# Patient Record
Sex: Female | Born: 1945 | Race: White | Hispanic: No | Marital: Married | Smoking: Former smoker
Health system: Southern US, Community
[De-identification: ages and names within clinical notes are randomized; demographics above are authoritative.]

## PROBLEM LIST (undated history)

## (undated) ENCOUNTER — Inpatient Hospital Stay: Admission: EM | Payer: Self-pay | Source: Home / Self Care

## (undated) DIAGNOSIS — I2510 Atherosclerotic heart disease of native coronary artery without angina pectoris: Secondary | ICD-10-CM

## (undated) DIAGNOSIS — E669 Obesity, unspecified: Secondary | ICD-10-CM

## (undated) DIAGNOSIS — G4730 Sleep apnea, unspecified: Secondary | ICD-10-CM

## (undated) DIAGNOSIS — I10 Essential (primary) hypertension: Secondary | ICD-10-CM

## (undated) DIAGNOSIS — R112 Nausea with vomiting, unspecified: Secondary | ICD-10-CM

## (undated) DIAGNOSIS — Z85828 Personal history of other malignant neoplasm of skin: Secondary | ICD-10-CM

## (undated) DIAGNOSIS — K219 Gastro-esophageal reflux disease without esophagitis: Secondary | ICD-10-CM

## (undated) DIAGNOSIS — E119 Type 2 diabetes mellitus without complications: Secondary | ICD-10-CM

## (undated) DIAGNOSIS — C801 Malignant (primary) neoplasm, unspecified: Secondary | ICD-10-CM

## (undated) DIAGNOSIS — R252 Cramp and spasm: Secondary | ICD-10-CM

## (undated) DIAGNOSIS — S86012A Strain of left Achilles tendon, initial encounter: Secondary | ICD-10-CM

## (undated) DIAGNOSIS — R42 Dizziness and giddiness: Secondary | ICD-10-CM

## (undated) DIAGNOSIS — Z9889 Other specified postprocedural states: Secondary | ICD-10-CM

## (undated) DIAGNOSIS — M1710 Unilateral primary osteoarthritis, unspecified knee: Secondary | ICD-10-CM

## (undated) DIAGNOSIS — R0600 Dyspnea, unspecified: Secondary | ICD-10-CM

## (undated) DIAGNOSIS — E785 Hyperlipidemia, unspecified: Secondary | ICD-10-CM

## (undated) DIAGNOSIS — I499 Cardiac arrhythmia, unspecified: Secondary | ICD-10-CM

## (undated) HISTORY — DX: Hyperlipidemia, unspecified: E78.5

## (undated) HISTORY — PX: SKIN CANCER EXCISION: SHX779

## (undated) HISTORY — PX: APPENDECTOMY: SHX54

## (undated) HISTORY — DX: Obesity, unspecified: E66.9

## (undated) HISTORY — PX: COLONOSCOPY: SHX174

---

## 1999-09-16 ENCOUNTER — Encounter: Admission: RE | Admit: 1999-09-16 | Discharge: 1999-09-16 | Payer: Self-pay | Admitting: Internal Medicine

## 1999-09-16 ENCOUNTER — Encounter: Payer: Self-pay | Admitting: Internal Medicine

## 1999-11-05 ENCOUNTER — Other Ambulatory Visit: Admission: RE | Admit: 1999-11-05 | Discharge: 1999-11-05 | Payer: Self-pay | Admitting: Internal Medicine

## 2000-12-03 ENCOUNTER — Other Ambulatory Visit: Admission: RE | Admit: 2000-12-03 | Discharge: 2000-12-03 | Payer: Self-pay | Admitting: Internal Medicine

## 2001-10-26 ENCOUNTER — Encounter: Payer: Self-pay | Admitting: Internal Medicine

## 2001-10-26 ENCOUNTER — Encounter: Admission: RE | Admit: 2001-10-26 | Discharge: 2001-10-26 | Payer: Self-pay | Admitting: Internal Medicine

## 2002-05-03 ENCOUNTER — Ambulatory Visit (HOSPITAL_COMMUNITY): Admission: RE | Admit: 2002-05-03 | Discharge: 2002-05-03 | Payer: Self-pay | Admitting: Gastroenterology

## 2002-08-30 ENCOUNTER — Encounter: Admission: RE | Admit: 2002-08-30 | Discharge: 2002-08-30 | Payer: Self-pay | Admitting: Plastic Surgery

## 2002-08-30 ENCOUNTER — Encounter: Payer: Self-pay | Admitting: Plastic Surgery

## 2003-01-10 ENCOUNTER — Encounter: Payer: Self-pay | Admitting: Internal Medicine

## 2003-01-10 ENCOUNTER — Encounter: Admission: RE | Admit: 2003-01-10 | Discharge: 2003-01-10 | Payer: Self-pay | Admitting: Internal Medicine

## 2003-01-23 ENCOUNTER — Inpatient Hospital Stay (HOSPITAL_COMMUNITY): Admission: EM | Admit: 2003-01-23 | Discharge: 2003-01-24 | Payer: Self-pay | Admitting: Emergency Medicine

## 2003-01-23 ENCOUNTER — Encounter: Payer: Self-pay | Admitting: Emergency Medicine

## 2003-05-09 ENCOUNTER — Ambulatory Visit (HOSPITAL_COMMUNITY): Admission: RE | Admit: 2003-05-09 | Discharge: 2003-05-09 | Payer: Self-pay | Admitting: Specialist

## 2003-05-09 ENCOUNTER — Encounter: Payer: Self-pay | Admitting: Specialist

## 2004-01-02 ENCOUNTER — Ambulatory Visit (HOSPITAL_COMMUNITY): Admission: RE | Admit: 2004-01-02 | Discharge: 2004-01-02 | Payer: Self-pay | Admitting: Gastroenterology

## 2004-01-16 ENCOUNTER — Encounter: Admission: RE | Admit: 2004-01-16 | Discharge: 2004-01-16 | Payer: Self-pay | Admitting: Internal Medicine

## 2005-03-14 ENCOUNTER — Emergency Department (HOSPITAL_COMMUNITY): Admission: EM | Admit: 2005-03-14 | Discharge: 2005-03-14 | Payer: Self-pay | Admitting: Emergency Medicine

## 2005-03-29 ENCOUNTER — Encounter: Admission: RE | Admit: 2005-03-29 | Discharge: 2005-03-29 | Payer: Self-pay | Admitting: Internal Medicine

## 2005-05-27 ENCOUNTER — Encounter: Admission: RE | Admit: 2005-05-27 | Discharge: 2005-06-10 | Payer: Self-pay | Admitting: *Deleted

## 2006-01-26 ENCOUNTER — Encounter: Admission: RE | Admit: 2006-01-26 | Discharge: 2006-01-26 | Payer: Self-pay | Admitting: Internal Medicine

## 2006-12-14 ENCOUNTER — Encounter: Admission: RE | Admit: 2006-12-14 | Discharge: 2006-12-21 | Payer: Self-pay | Admitting: Internal Medicine

## 2007-02-22 ENCOUNTER — Encounter: Admission: RE | Admit: 2007-02-22 | Discharge: 2007-02-22 | Payer: Self-pay | Admitting: Internal Medicine

## 2007-07-01 ENCOUNTER — Emergency Department (HOSPITAL_COMMUNITY): Admission: EM | Admit: 2007-07-01 | Discharge: 2007-07-01 | Payer: Self-pay | Admitting: Emergency Medicine

## 2008-05-22 ENCOUNTER — Encounter: Admission: RE | Admit: 2008-05-22 | Discharge: 2008-05-22 | Payer: Self-pay | Admitting: Internal Medicine

## 2009-06-18 ENCOUNTER — Encounter: Admission: RE | Admit: 2009-06-18 | Discharge: 2009-06-18 | Payer: Self-pay | Admitting: Internal Medicine

## 2010-03-13 ENCOUNTER — Inpatient Hospital Stay (HOSPITAL_COMMUNITY): Admission: EM | Admit: 2010-03-13 | Discharge: 2010-03-16 | Payer: Self-pay | Admitting: Emergency Medicine

## 2010-03-14 HISTORY — PX: OTHER SURGICAL HISTORY: SHX169

## 2010-05-27 HISTORY — PX: NM MYOCAR PERF WALL MOTION: HXRAD629

## 2010-07-29 ENCOUNTER — Encounter: Admission: RE | Admit: 2010-07-29 | Discharge: 2010-07-29 | Payer: Self-pay | Admitting: Internal Medicine

## 2010-09-03 ENCOUNTER — Ambulatory Visit (HOSPITAL_BASED_OUTPATIENT_CLINIC_OR_DEPARTMENT_OTHER): Admission: RE | Admit: 2010-09-03 | Discharge: 2010-09-03 | Payer: Self-pay | Admitting: Orthopedic Surgery

## 2010-12-09 NOTE — Op Note (Signed)
  [name], Deborah Elliott                ACCOUNT NO.:  [id number]  MEDICAL RECORD NO.:  [id number]          PATIENT TYPE:  AMB  LOCATION:  DSC                          FACILITY:  [facility]  PHYSICIAN:  Cindee Salt, M.D.       DATE OF BIRTH:  [birth year]  DATE OF PROCEDURE:  09/03/2010 DATE OF DISCHARGE:                              OPERATIVE REPORT   PREOPERATIVE DIAGNOSIS:  Mass left index finger.  POSTOPERATIVE DIAGNOSIS:  Mass left index finger.  OPERATION:  Excisional biopsy mass left index finger.  SURGEON:  Cindee Salt, MD  ASSISTANT:  Carolyne Fiscal, RN  ANESTHESIA:  Forearm-based IV regional.  ANESTHESIOLOGIST:  Janetta Hora. Frederick, MD  HISTORY:  The patient is a 65 year old female with a history of a large mass on the pulp of her left index finger.  She has elected to have this excised.  Pre, peri, postoperative course have been discussed along with risks and complications.  She is aware that there is no guarantee with the surgery, possibility of infection, recurrence of injury to arteries, nerves, tendons, incomplete relief of symptoms, dystrophy.  In the preoperative area, the patient is seen, the extremity marked by both the patient and surgeon, antibiotic given.  PROCEDURE:  The patient was brought to the operating room where a forearm-based IV regional anesthetic was carried out without difficulty. She was prepped using ChloraPrep, supine position, left arm free.  A 3- minute dry time was allowed, time-out taken, confirming the patient and procedure.  A curvilinear incision was made based on the radial aspect of the finger where it was carried down to the pulp ulnarly.  This was carried down through subcutaneous tissue.  The neurovascular structures were identified and protected.  Digital nerve was found to be draped and compressed along the entire course over the volar aspect of the mass. This was a multilobulated yellow tan brown tumor.  With blunt and sharp dissection, this  was freed from the surrounding tissue, it appeared to come from the flexor tendon which was identified in the depths of the wound.  The entire specimen was excised, sent to Pathology.  The digital neurovascular bundle was preserved through the entire procedure.  The wound was copiously irrigated with saline.  The skin then closed with interrupted 5-0 Vicryl Rapide sutures.  This was done after irrigation. The digit was blocked with 0.25% Marcaine without epinephrine, 4.5 mL was used.  A sterile compressive dressing and splint to the finger applied.  On deflation of the tourniquet, all fingers immediately pinked.  She was taken to the recovery room for observation in satisfactory condition.  She will be discharged home to return to the [facility] in 1 week on Vicodin.          ______________________________ Cindee Salt, M.D.     GK/MEDQ  D:  09/03/2010  T:  09/03/2010  Job:  [id number]  cc:   Geoffry Paradise, M.D.  Electronically Signed by Cindee Salt M.D. on 12/09/2010 12:23:49 PM

## 2010-12-31 LAB — BASIC METABOLIC PANEL
BUN: 9 mg/dL (ref 6–23)
CO2: 27 mEq/L (ref 19–32)
Calcium: 9 mg/dL (ref 8.4–10.5)
Chloride: 107 mEq/L (ref 96–112)
Creatinine, Ser: 0.52 mg/dL (ref 0.4–1.2)
GFR calc Af Amer: >60 mL/min (ref >60)
GFR calc non Af Amer: >60 mL/min (ref >60)
Glucose, Bld: 125 mg/dL — ABNORMAL HIGH (ref 70–99)
Potassium: 4 mEq/L (ref 3.5–5.1)
Sodium: 140 mEq/L (ref 135–145)

## 2010-12-31 LAB — POCT HEMOGLOBIN-HEMACUE: Hemoglobin: 14.9 g/dL (ref 12.0–15.0)

## 2011-01-06 LAB — POCT CARDIAC MARKERS
CKMB, poc: 1.8 ng/mL (ref 1.0–8.0)
Myoglobin, poc: 60.2 ng/mL (ref 12–200)
Troponin i, poc: <0.05 ng/mL (ref 0.00–0.09)

## 2011-01-06 LAB — COMPREHENSIVE METABOLIC PANEL
ALT: 23 U/L (ref 0–35)
AST: 21 U/L (ref 0–37)
Albumin: 2.6 g/dL — ABNORMAL LOW (ref 3.5–5.2)
Alkaline Phosphatase: 44 U/L (ref 39–117)
BUN: 13 mg/dL (ref 6–23)
CO2: 28 mEq/L (ref 19–32)
Calcium: 8.4 mg/dL (ref 8.4–10.5)
Chloride: 105 mEq/L (ref 96–112)
Creatinine, Ser: 0.59 mg/dL (ref 0.4–1.2)
GFR calc Af Amer: >60 mL/min (ref >60)
GFR calc non Af Amer: >60 mL/min (ref >60)
Glucose, Bld: 145 mg/dL — ABNORMAL HIGH (ref 70–99)
Potassium: 4.4 mEq/L (ref 3.5–5.1)
Sodium: 138 mEq/L (ref 135–145)
Total Bilirubin: 0.8 mg/dL (ref 0.3–1.2)
Total Protein: 4.9 g/dL — ABNORMAL LOW (ref 6.0–8.3)

## 2011-01-06 LAB — CBC
HCT: 33.9 % — ABNORMAL LOW (ref 36.0–46.0)
HCT: 34.3 % — ABNORMAL LOW (ref 36.0–46.0)
HCT: 38.9 % (ref 36.0–46.0)
HCT: 43.6 % (ref 36.0–46.0)
Hemoglobin: 11.7 g/dL — ABNORMAL LOW (ref 12.0–15.0)
Hemoglobin: 11.9 g/dL — ABNORMAL LOW (ref 12.0–15.0)
Hemoglobin: 13.6 g/dL (ref 12.0–15.0)
Hemoglobin: 15 g/dL (ref 12.0–15.0)
MCHC: 34.4 g/dL (ref 30.0–36.0)
MCHC: 34.5 g/dL (ref 30.0–36.0)
MCHC: 34.6 g/dL (ref 30.0–36.0)
MCHC: 35.1 g/dL (ref 30.0–36.0)
MCV: 91.1 fL (ref 78.0–100.0)
MCV: 91.6 fL (ref 78.0–100.0)
MCV: 91.6 fL (ref 78.0–100.0)
MCV: 91.7 fL (ref 78.0–100.0)
Platelets: 118 10*3/uL — ABNORMAL LOW (ref 150–400)
Platelets: 132 10*3/uL — ABNORMAL LOW (ref 150–400)
Platelets: 140 10*3/uL — ABNORMAL LOW (ref 150–400)
Platelets: 158 10*3/uL (ref 150–400)
RBC: 3.7 MIL/uL — ABNORMAL LOW (ref 3.87–5.11)
RBC: 3.75 MIL/uL — ABNORMAL LOW (ref 3.87–5.11)
RBC: 4.27 MIL/uL (ref 3.87–5.11)
RBC: 4.76 MIL/uL (ref 3.87–5.11)
RDW: 14.3 % (ref 11.5–15.5)
RDW: 14.4 % (ref 11.5–15.5)
RDW: 14.5 % (ref 11.5–15.5)
RDW: 14.6 % (ref 11.5–15.5)
WBC: 11 10*3/uL — ABNORMAL HIGH (ref 4.0–10.5)
WBC: 8.2 10*3/uL (ref 4.0–10.5)
WBC: 8.3 10*3/uL (ref 4.0–10.5)
WBC: 9.4 10*3/uL (ref 4.0–10.5)

## 2011-01-06 LAB — BASIC METABOLIC PANEL
BUN: 15 mg/dL (ref 6–23)
BUN: 15 mg/dL (ref 6–23)
CO2: 27 mEq/L (ref 19–32)
CO2: 28 mEq/L (ref 19–32)
Calcium: 8.5 mg/dL (ref 8.4–10.5)
Calcium: 8.6 mg/dL (ref 8.4–10.5)
Chloride: 103 mEq/L (ref 96–112)
Chloride: 106 mEq/L (ref 96–112)
Creatinine, Ser: 0.67 mg/dL (ref 0.4–1.2)
Creatinine, Ser: 0.77 mg/dL (ref 0.4–1.2)
GFR calc Af Amer: >60 mL/min (ref >60)
GFR calc Af Amer: >60 mL/min (ref >60)
GFR calc non Af Amer: >60 mL/min (ref >60)
GFR calc non Af Amer: >60 mL/min (ref >60)
Glucose, Bld: 108 mg/dL — ABNORMAL HIGH (ref 70–99)
Glucose, Bld: 120 mg/dL — ABNORMAL HIGH (ref 70–99)
Potassium: 3.6 mEq/L (ref 3.5–5.1)
Potassium: 3.9 mEq/L (ref 3.5–5.1)
Sodium: 137 mEq/L (ref 135–145)
Sodium: 138 mEq/L (ref 135–145)

## 2011-01-06 LAB — ABO/RH: ABO/RH(D): O NEG

## 2011-01-06 LAB — PROTIME-INR
INR: 1.04 (ref 0.00–1.49)
Prothrombin Time: 13.5 seconds (ref 11.6–15.2)

## 2011-01-06 LAB — LIPID PANEL
Cholesterol: 284 mg/dL — ABNORMAL HIGH (ref 0–200)
HDL: 53 mg/dL (ref >39)
LDL Cholesterol: 188 mg/dL — ABNORMAL HIGH (ref 0–99)
Total CHOL/HDL Ratio: 5.4 RATIO
Triglycerides: 215 mg/dL — ABNORMAL HIGH (ref <150)
VLDL: 43 mg/dL — ABNORMAL HIGH (ref 0–40)

## 2011-01-06 LAB — CK TOTAL AND CKMB (NOT AT ARMC)
CK, MB: 2.6 ng/mL (ref 0.3–4.0)
CK, MB: 2.8 ng/mL (ref 0.3–4.0)
Relative Index: INVALID (ref 0.0–2.5)
Relative Index: INVALID (ref 0.0–2.5)
Total CK: 62 U/L (ref 7–177)
Total CK: 65 U/L (ref 7–177)

## 2011-01-06 LAB — CROSSMATCH
ABO/RH(D): O NEG
Antibody Screen: NEGATIVE

## 2011-01-06 LAB — CARDIAC PANEL(CRET KIN+CKTOT+MB+TROPI)
CK, MB: 1.7 ng/mL (ref 0.3–4.0)
CK, MB: 2.4 ng/mL (ref 0.3–4.0)
CK, MB: 2.6 ng/mL (ref 0.3–4.0)
Relative Index: 2.4 (ref 0.0–2.5)
Relative Index: INVALID (ref 0.0–2.5)
Relative Index: INVALID (ref 0.0–2.5)
Total CK: 102 U/L (ref 7–177)
Total CK: 37 U/L (ref 7–177)
Total CK: 65 U/L (ref 7–177)
Troponin I: 0.02 ng/mL (ref 0.00–0.06)
Troponin I: 0.02 ng/mL (ref 0.00–0.06)
Troponin I: 0.05 ng/mL (ref 0.00–0.06)

## 2011-01-06 LAB — DIFFERENTIAL
Basophils Absolute: 0.1 10*3/uL (ref 0.0–0.1)
Basophils Relative: 1 % (ref 0–1)
Eosinophils Absolute: 0.4 10*3/uL (ref 0.0–0.7)
Eosinophils Relative: 3 % (ref 0–5)
Lymphocytes Relative: 22 % (ref 12–46)
Lymphs Abs: 2.4 10*3/uL (ref 0.7–4.0)
Monocytes Absolute: 0.6 10*3/uL (ref 0.1–1.0)
Monocytes Relative: 6 % (ref 3–12)
Neutro Abs: 7.5 10*3/uL (ref 1.7–7.7)
Neutrophils Relative %: 68 % (ref 43–77)

## 2011-01-06 LAB — TSH: TSH: 1.997 u[IU]/mL (ref 0.350–4.500)

## 2011-01-06 LAB — TROPONIN I
Troponin I: 0.02 ng/mL (ref 0.00–0.06)
Troponin I: 0.03 ng/mL (ref 0.00–0.06)

## 2011-01-06 LAB — APTT: aPTT: 37 seconds (ref 24–37)

## 2011-01-06 LAB — HEPARIN LEVEL (UNFRACTIONATED)
Heparin Unfractionated: 0.2 IU/mL — ABNORMAL LOW (ref 0.30–0.70)
Heparin Unfractionated: <0.1 IU/mL — ABNORMAL LOW (ref 0.30–0.70)

## 2011-01-06 LAB — HEMOGLOBIN A1C
Hgb A1c MFr Bld: 6.4 % — ABNORMAL HIGH (ref <5.7)
Mean Plasma Glucose: 137 mg/dL — ABNORMAL HIGH (ref <117)

## 2011-01-06 LAB — POCT I-STAT, CHEM 8
BUN: 15 mg/dL (ref 6–23)
Calcium, Ion: 1.21 mmol/L (ref 1.12–1.32)
Chloride: 105 mEq/L (ref 96–112)
Creatinine, Ser: 0.5 mg/dL (ref 0.4–1.2)
Glucose, Bld: 104 mg/dL — ABNORMAL HIGH (ref 70–99)
HCT: 45 % (ref 36.0–46.0)
Hemoglobin: 15.3 g/dL — ABNORMAL HIGH (ref 12.0–15.0)
Potassium: 4 mEq/L (ref 3.5–5.1)
Sodium: 138 mEq/L (ref 135–145)
TCO2: 28 mmol/L (ref 0–100)

## 2011-01-06 LAB — HEMOGLOBIN AND HEMATOCRIT, BLOOD
HCT: 38.3 % (ref 36.0–46.0)
Hemoglobin: 13.4 g/dL (ref 12.0–15.0)

## 2011-01-06 LAB — GLUCOSE, CAPILLARY: Glucose-Capillary: 111 mg/dL — ABNORMAL HIGH (ref 70–99)

## 2011-01-06 LAB — MRSA PCR SCREENING: MRSA by PCR: NEGATIVE

## 2011-01-06 LAB — BRAIN NATRIURETIC PEPTIDE: Pro B Natriuretic peptide (BNP): 65 pg/mL (ref 0.0–100.0)

## 2011-01-17 ENCOUNTER — Emergency Department (HOSPITAL_COMMUNITY): Payer: Medicare Other

## 2011-01-17 ENCOUNTER — Emergency Department (HOSPITAL_COMMUNITY)
Admission: EM | Admit: 2011-01-17 | Discharge: 2011-01-17 | Disposition: A | Discharge status: Home / Self Care | Payer: Medicare Other | Attending: Emergency Medicine | Admitting: Emergency Medicine

## 2011-01-17 DIAGNOSIS — I2510 Atherosclerotic heart disease of native coronary artery without angina pectoris: Secondary | ICD-10-CM | POA: Insufficient documentation

## 2011-01-17 DIAGNOSIS — R61 Generalized hyperhidrosis: Secondary | ICD-10-CM | POA: Insufficient documentation

## 2011-01-17 DIAGNOSIS — Z79899 Other long term (current) drug therapy: Secondary | ICD-10-CM | POA: Insufficient documentation

## 2011-01-17 DIAGNOSIS — R079 Chest pain, unspecified: Secondary | ICD-10-CM | POA: Insufficient documentation

## 2011-01-17 DIAGNOSIS — I10 Essential (primary) hypertension: Secondary | ICD-10-CM | POA: Insufficient documentation

## 2011-01-17 DIAGNOSIS — Z7982 Long term (current) use of aspirin: Secondary | ICD-10-CM | POA: Insufficient documentation

## 2011-01-17 DIAGNOSIS — R110 Nausea: Secondary | ICD-10-CM | POA: Insufficient documentation

## 2011-01-17 LAB — D-DIMER, QUANTITATIVE: D-Dimer, Quant: 0.31 ug/mL-FEU (ref 0.00–0.48)

## 2011-01-17 LAB — POCT CARDIAC MARKERS
CKMB, poc: <1 ng/mL — ABNORMAL LOW (ref 1.0–8.0)
CKMB, poc: <1 ng/mL — ABNORMAL LOW (ref 1.0–8.0)
Myoglobin, poc: 44.4 ng/mL (ref 12–200)
Myoglobin, poc: 67.6 ng/mL (ref 12–200)
Troponin i, poc: <0.05 ng/mL (ref 0.00–0.09)
Troponin i, poc: <0.05 ng/mL (ref 0.00–0.09)

## 2011-01-17 LAB — POCT I-STAT, CHEM 8
BUN: 12 mg/dL (ref 6–23)
Calcium, Ion: 1.21 mmol/L (ref 1.12–1.32)
Chloride: 106 mEq/L (ref 96–112)
Creatinine, Ser: 0.8 mg/dL (ref 0.4–1.2)
Glucose, Bld: 105 mg/dL — ABNORMAL HIGH (ref 70–99)
HCT: 37 % (ref 36.0–46.0)
Hemoglobin: 12.6 g/dL (ref 12.0–15.0)
Potassium: 4.1 mEq/L (ref 3.5–5.1)
Sodium: 142 mEq/L (ref 135–145)
TCO2: 25 mmol/L (ref 0–100)

## 2011-01-17 LAB — GLUCOSE, CAPILLARY: Glucose-Capillary: 114 mg/dL — ABNORMAL HIGH (ref 70–99)

## 2011-03-07 NOTE — Procedures (Signed)
[place]  Patient:    Deborah Elliott, Deborah Elliott Visit Number: [id number] MRN: [id number]          Service Type: END Location: ENDO Attending Physician:  Orland Mustard Dictated by:   Llana Aliment. Randa Evens, M.D. Proc. Date: 05/03/02 Admit Date:  05/03/2002 Discharge Date: 05/03/2002   CC:         Richard A. Jacky Kindle, M.D.   Procedure Report  PROCEDURE PERFORMED:  Colonoscopy.  ENDOSCOPIST:  Llana Aliment. Randa Evens, M.D.  MEDICATIONS USED:  Fentanyl 125 mcg, Versed 10 mg IV.  INSTRUMENT:  Olympus pediatric colonoscope.  INDICATIONS:  Strong family history of colon cancer.  Mother and grandmother died of metastatic cancer presumed to be colon.  Grandmother and uncle had documented colon cancer on the maternal side.  Due to the strong family history, colonoscopy was performed.  DESCRIPTION OF PROCEDURE:  The procedure had been explained to the patient and consent obtained.  With the patient in the left lateral decubitus position, the Olympus pediatric colonoscope was inserted and advanced under direct visualization.  The prep was excellent and we were able to advance to the cecum using abdominal pressure.  The ileocecal valve was seen and entered for a short distance.  The scope was withdrawn.  The cecum, ascending colon, hepatic flexure, transverse colon, splenic flexure, descending colon were seen well upon withdrawal and were all normal.  No polyps seen, no significant diverticular disease.  Scope withdrawn, patient tolerated the procedure well and was resting comfortably at the termination of the procedure.  ASSESSMENT:  No polyps or neoplasia in a woman with a very strong family history of colon cancer.  PLAN:  Will recommend repeating in five years.  Will recommend yearly Hemoccults during physical exam.Dictated by:   Fayrene Fearing L. Randa Evens, M.D.  Attending Physician:  Orland Mustard DD:  05/03/02 TD:  05/05/02 Job: [id number]

## 2011-03-07 NOTE — Discharge Summary (Signed)
NAME:  Deborah Elliott, Deborah Elliott                          ACCOUNT NO.:  [id number]   MEDICAL RECORD NO.:  [id number]                   PATIENT TYPE:  INP   LOCATION:  6526                                 FACILITY:  [facility]   PHYSICIAN:  Francisca December, M.D.               DATE OF BIRTH:  [birth year]   DATE OF ADMISSION:  01/23/2003  DATE OF DISCHARGE:  01/24/2003                                 DISCHARGE SUMMARY   ADMISSION DIAGNOSES:  1. Tachypalpitations.  2. Chest discomfort.  3. Hypertension.  4. Hyperlipidemia.  5. Remote tobacco abuse.  6. Unknown lipid status.  7. Hypokalemia.   DISCHARGE DIAGNOSES:  1. Tachy palpitations, resolved after Valsalva maneuver, no recurrence.  2. Chest discomfort, resolved.  Cardiac enzymes negative.  3. Hypertension.  4. Hyperlipidemia.  5. Remote tobacco abuse.  6. Unknown lipid status.  7. Hypokalemia, repleted.   HISTORY OF PRESENT ILLNESS:  This is a 65 year old obese white female with a  history of palpitations.  She has never seen a cardiologist before.  She  states she has had prior episodes of tachy palpitations but they usually did  not last that long.  Onset of symptoms/episodes about six months ago.  Developed spontaneous onset of tachy palpitations with neck/throat fullness,  and the symptoms last about 10 to 15 minutes.  They spontaneously resolve.  She has also had some exertional chest tightness/burning on occasion  unrelated to palpitations.  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  No nausea or vomiting.  Symptoms  persisted.  Eventually EMS was called.  They found the patient to be in SVT  at a heart rate of 170-200.  Valsalva maneuver successfully converted the  patient back to normal sinus rhythm.  The patient was taken to the emergency  room for further evaluation.   EKG at 1852 on 01/23/2003 showed SVT with ST  inversion in II, III, aVF, and 5  and 6.  Repeat EKG at 1855 after Valsalva showed sinus rhythm with T wave  inversions in III and aVF.  EKG 01/24/2003 showed sinus rhythm without  significant ST or T wave abnormality.   PROCEDURES:  None.   CONSULTATIONS:  None.   COMPLICATIONS:  None.   HOSPITAL COURSE:  The patient was admitted to [facility] on  01/23/2003 for PSVT treated with Valsalva maneuver.  Also with chest  discomfort.   Cardiac enzymes were negative x3.  The patient remained hemodynamically  stable and had no further chest pain during her hospitalization.   Admission labs showed a WBC of 6.2, hemoglobin 13.4, and platelets 144.  Potassium low at 3.3, BUN 12, creatinine 0.7.  Glucose slightly elevated at  115 (nonfasting  specimen).   Chest x-ray showed no active disease.   The patient was seen and examined by Dr.  Corliss Marcus on 01/24/2003.  He  thought the patient was stable.  She has had negative cardiac enzymes x3.  He recommends discharging her home today and scheduling for outpatient  Cardiolite stress test.   The patient is agreeable to this approach.   Potassium was repleted with 40 of K-Dur.  BMP is pending at the time of  discharge.  Assuming it is normal,  the patient will be discharged to home.   DISCHARGE MEDICATIONS:  1. Zocor 20 mg.  2. Caltrate one a day.  3. Hydrochlorothiazide.  4. Monopril 20 mg.  5. Fosamax weekly.  6. Toprol-XL 50 mg.  7. Enteric-coated aspirin 81 mg.  8. Nitroglycerin as needed for chest pain.  9. Tylenol as needed.   ACTIVITY:  As tolerated.   DIET:  Low fat, low cholesterol, low salt diet.   DISCHARGE INSTRUCTIONS:  Stress Cardiolite at 12 o'clock on Thursday  01/26/2003.  She is not to eat or drink after midnight on 01/25/2003 except  medicines.  Do not take Toprol on 01/25/2003 or 01/26/2003.  Start on 01/27/2003.  Wear comfortable clothing and walking shoes.  No caffeine or decaf on  01/25/2003 or 01/26/2003.    FOLLOW  UP:  She will follow up with Dr. Logan Bores on Tuesday 01/31/2003 at 9  o'clock in the morning or sooner if any problems or questions.     Georgiann Cocker Jernejcic, P.A.                   Francisca December, M.D.    TCJ/MEDQ  D:  01/24/2003  T:  01/25/2003  Job:  [id number]   cc:   Geoffry Paradise, M.D.  [address]  [place]  [place] [address]  Fax: [phone]   Francisca December, M.D.  [address]  [address]  [place]  [place] [address]  Fax: [phone]

## 2011-03-07 NOTE — H&P (Signed)
NAME:  Deborah Elliott, Deborah Elliott                          ACCOUNT NO.:  [id number]   MEDICAL RECORD NO.:  [id number]                   PATIENT TYPE:  INP   LOCATION:  6526                                 FACILITY:  [facility]   PHYSICIAN:  Francisca December, M.D.               DATE OF BIRTH:  1946-04-26   DATE OF ADMISSION:  DATE OF DISCHARGE:  01/24/2003                                HISTORY & PHYSICAL   ADDITIONAL DIAGNOSES:  1. Tachy palpitations.  2. Chest discomfort, rule out myocardial infarction.  3. Hypertension.  4. Hyperlipidemia.  5. Obesity.  6. Osteoporosis.   CHIEF COMPLAINT:  Tachy palpitations.   HISTORY OF PRESENT ILLNESS:  The patient is a 65 year old obese female with  a history of palpitations.  She has never seen a cardiologist before. She  said she has had prior episodes of tachy palpitations but they usually do  not last that long. She has had onset of these tachycardic episodes for the  last 6 months. She developed spontaneous onset with neck and throat  fullness. Her symptoms last about 10 to 15 minutes and spontaneously  resolve. She has also had exertional chest discomfort and burning on  occasion (unrelated to palpitations).   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 Her symptoms persisted. Eventually EMS was  called. The patient was  found to be in SVT with a heart rate of 178 to 200.  A Valsalva was performed and the patient returned to sinus rhythm. She was  transferred to [facility] ER for further evaluation.   ALLERGIES:  SULFA.   MEDICATIONS:  1. Fosamax.  2. Zocor.  3. Caltrate.  4. Hydrochlorothiazide.  5. Monopril.   PAST MEDICAL HISTORY:  1. Hyperlipidemia.  2. Osteoporosis.  3. Hypertension.   PAST SURGICAL HISTORY:  Appendectomy.   SOCIAL HISTORY:  The patient is married. Mother of 2  living children,  grandmother of 3. She works as a Tree surgeon. No tobacco, quit in the 1970s.  No alcohol or illicit drug use.   FAMILY HISTORY:  Essentially unremarkable to this admission.   REVIEW OF SYSTEMS:  No fever or chills, cough or cold. She does dysphagia.  She does have reflux after eating certain foods. Occasional nocturnal  indigestion. No PND or orthopnea. No melena,  hematuria or dysuria.   PHYSICAL EXAMINATION:  VITAL SIGNS:  Temperature 97.2, blood pressure  160/70, pulse 74, respirations 20, O2 saturation 97% on 2 liters nasal  cannula.  GENERAL:  The patient is alert and oriented x3. In no acute distress.  HEENT:  Normocephalic, atraumatic.  NECK:  Supple without bruits or masses.  LUNGS:  Clear to auscultation.  CARDIAC:  Telemetry showed sinus rhythm without acute ST abnormality.  Regular rate and rhythm without murmurs, rubs, gallops.  ABDOMEN:  Soft, obese, nontender, positive bowel sounds.  EXTREMITIES:  2+ distal pulses, no edema.  NEUROLOGIC:  Nonfocal. Sensation intact.  SKIN:  Dry.   LABORATORY DATA:  An EKG on January 23, 2003, at 1852 showed SVT with ST  changes in 2, 3, AVF, V5 and 6. On January 23, 2003, at [place] after Valsalva  showed sinus rhythm with T-wave conversion in 3 and AVF. On January 24, 2003,  at 0344, the patient was  in sinus rhythm with no significant ST or T-wave  abnormality. A chest x-ray shows no active disease.   Potassium 3.3, BUN 12, creatinine 0.7, glucose 115. Hemoglobin 13.4,  platelet count slightly low at 144. CK 118, MB 1.9, troponin I 0.05, 0.06.   IMPRESSION:  1. Paroxysmal supraventricular tachycardia.  2. Chest discomfort, rule out myocardial infarction.  3. Hypertension.  4. Hyperlipidemia.  5. Hypokalemia.  6. Obesity.   PLAN:  Admit. Serial cardiac enzymes. Aspirin and beta blocker therapy. If  cardiac enzymes are negative will do an outpatient cardiac study with  possible catheter. Replete potassium.     Georgiann Cocker  Jernejcic, P.A.                   Francisca December, M.D.    TCJ/MEDQ  D:  01/24/2003  T:  01/24/2003  Job:  [id number]   cc:   Francisca December, M.D.  [address]  [address]  [place]  [place] [address]  Fax: [phone]   Geoffry Paradise, M.D.  [address]  [place]  [place] [address]  Fax: [phone]   Llana Aliment. Malon Kindle., M.D.  [address]  [place]  [place] [address]  Fax: [phone]

## 2011-03-07 NOTE — Op Note (Signed)
NAME:  Deborah Elliott, Deborah Elliott                          ACCOUNT NO.:  [id number]   MEDICAL RECORD NO.:  [id number]                   PATIENT TYPE:  AMB   LOCATION:  ENDO                                 FACILITY:  [facility]   PHYSICIAN:  James L. Malon Kindle., M.D.          DATE OF BIRTH:  [birth year]   DATE OF PROCEDURE:  01/02/2004  DATE OF DISCHARGE:                                 OPERATIVE REPORT   PROCEDURE:  Colonoscopy.   MEDICATIONS:  Fentanyl 100 mcg, Versed 10 mg IV.   ENDOSCOPE:  Olympus pediatric colonoscope.   INDICATIONS:  Recent rectal bleeding in a woman with a family history of  colon cancer, had a colonoscopy three years ago that was negative.  Comes in  now due to recent rectal bleeding.   DESCRIPTION OF PROCEDURE:  The procedure had been explained to the patient  and consent obtained.  With the patient in the left lateral decubitus  position, the Olympus pediatric adjustable scope was inserted and advanced.  The prep was excellent, and we were easily able to reach the cecum.  The  ileocecal valve and appendiceal orifice were seen.  The scope was withdrawn  and the cecum, ascending colon, transverse colon, splenic flexure,  descending, and sigmoid colon were seen well.  No polyps or any other  lesions were seen.  There was no significant diverticular disease.  The  scope was withdrawn in the rectum.  There were some internal hemorrhoids in  the rectum, no active bleeding.  The scope was withdrawn.  The patient  tolerated the procedure well.   ASSESSMENT:  1. Rectal bleeding, 578.1, probably due to internal hemorrhoids.  2. Family history of colon cancer, V16.0.   PLAN:  Will recommend yearly Hemoccults, a hemorrhoid instruction sheet.  Repeat colonoscopy in five years and will see back in the office in six  months.                                               James L. Malon Kindle., M.D.    Waldron Session  D:  01/02/2004  T:  01/03/2004  Job:  [id number]   cc:   Geoffry Paradise, M.D.  [address]  [place]  [place] [address]  Fax: [phone]

## 2011-04-22 ENCOUNTER — Other Ambulatory Visit: Payer: Self-pay | Admitting: Internal Medicine

## 2011-04-22 DIAGNOSIS — R079 Chest pain, unspecified: Secondary | ICD-10-CM

## 2011-04-24 ENCOUNTER — Ambulatory Visit
Admission: RE | Admit: 2011-04-24 | Discharge: 2011-04-24 | Disposition: A | Discharge status: Home / Self Care | Payer: Medicare Other | Source: Ambulatory Visit | Attending: Internal Medicine | Admitting: Internal Medicine

## 2011-04-24 DIAGNOSIS — R079 Chest pain, unspecified: Secondary | ICD-10-CM

## 2011-04-24 MED ORDER — IOHEXOL 300 MG/ML  SOLN: 75.0000 mL | Freq: Once | INTRAMUSCULAR | Status: AC | PRN

## 2011-06-24 ENCOUNTER — Other Ambulatory Visit: Payer: Self-pay | Admitting: Internal Medicine

## 2011-06-24 DIAGNOSIS — Z1231 Encounter for screening mammogram for malignant neoplasm of breast: Secondary | ICD-10-CM

## 2011-08-01 LAB — POCT CARDIAC MARKERS
CKMB, poc: <1 — ABNORMAL LOW
Myoglobin, poc: 82.9
Operator id: 146091
Troponin i, poc: <0.05

## 2011-08-01 LAB — I-STAT 8, (EC8 V) (CONVERTED LAB)
BUN: 18
Bicarbonate: 27.2 — ABNORMAL HIGH
Chloride: 105
Glucose, Bld: 115 — ABNORMAL HIGH
HCT: 46
Hemoglobin: 15.6 — ABNORMAL HIGH
Operator id: 146091
Potassium: 3.8
Sodium: 139
TCO2: 29
pCO2, Ven: 50.7 — ABNORMAL HIGH
pH, Ven: 7.338 — ABNORMAL HIGH

## 2011-08-01 LAB — POCT I-STAT CREATININE
Creatinine, Ser: 0.8
Operator id: 146091

## 2011-08-01 LAB — D-DIMER, QUANTITATIVE: D-Dimer, Quant: <0.22

## 2011-09-02 ENCOUNTER — Ambulatory Visit: Payer: Medicare Other

## 2011-09-09 ENCOUNTER — Other Ambulatory Visit: Payer: Self-pay | Admitting: Internal Medicine

## 2011-09-09 DIAGNOSIS — N6489 Other specified disorders of breast: Secondary | ICD-10-CM

## 2011-09-09 DIAGNOSIS — S2000XA Contusion of breast, unspecified breast, initial encounter: Secondary | ICD-10-CM

## 2011-09-09 DIAGNOSIS — N63 Unspecified lump in unspecified breast: Secondary | ICD-10-CM

## 2011-09-16 ENCOUNTER — Ambulatory Visit: Payer: Medicare Other

## 2011-09-24 ENCOUNTER — Ambulatory Visit
Admission: RE | Admit: 2011-09-24 | Discharge: 2011-09-24 | Disposition: A | Discharge status: Home / Self Care | Payer: Medicare Other | Source: Ambulatory Visit | Attending: Internal Medicine | Admitting: Internal Medicine

## 2011-09-24 ENCOUNTER — Other Ambulatory Visit: Payer: Self-pay | Admitting: Internal Medicine

## 2011-09-24 DIAGNOSIS — N6489 Other specified disorders of breast: Secondary | ICD-10-CM

## 2011-09-24 DIAGNOSIS — N63 Unspecified lump in unspecified breast: Secondary | ICD-10-CM

## 2011-09-24 DIAGNOSIS — S2000XA Contusion of breast, unspecified breast, initial encounter: Secondary | ICD-10-CM

## 2012-01-12 ENCOUNTER — Other Ambulatory Visit: Payer: Self-pay | Admitting: Internal Medicine

## 2012-01-12 DIAGNOSIS — N63 Unspecified lump in unspecified breast: Secondary | ICD-10-CM

## 2012-02-13 ENCOUNTER — Ambulatory Visit
Admission: RE | Admit: 2012-02-13 | Discharge: 2012-02-13 | Disposition: A | Discharge status: Home / Self Care | Payer: Medicare Other | Source: Ambulatory Visit | Attending: Internal Medicine | Admitting: Internal Medicine

## 2012-02-13 DIAGNOSIS — N63 Unspecified lump in unspecified breast: Secondary | ICD-10-CM

## 2012-04-06 ENCOUNTER — Emergency Department (HOSPITAL_COMMUNITY)
Admission: EM | Admit: 2012-04-06 | Discharge: 2012-04-06 | Disposition: A | Discharge status: Home / Self Care | Payer: Medicare Other | Attending: Emergency Medicine | Admitting: Emergency Medicine

## 2012-04-06 ENCOUNTER — Encounter (HOSPITAL_COMMUNITY): Payer: Self-pay | Admitting: Physical Medicine and Rehabilitation

## 2012-04-06 ENCOUNTER — Emergency Department (HOSPITAL_COMMUNITY): Payer: Medicare Other

## 2012-04-06 DIAGNOSIS — W010XXA Fall on same level from slipping, tripping and stumbling without subsequent striking against object, initial encounter: Secondary | ICD-10-CM | POA: Insufficient documentation

## 2012-04-06 DIAGNOSIS — Y92009 Unspecified place in unspecified non-institutional (private) residence as the place of occurrence of the external cause: Secondary | ICD-10-CM | POA: Insufficient documentation

## 2012-04-06 DIAGNOSIS — S96819A Strain of other specified muscles and tendons at ankle and foot level, unspecified foot, initial encounter: Secondary | ICD-10-CM | POA: Insufficient documentation

## 2012-04-06 DIAGNOSIS — S86012A Strain of left Achilles tendon, initial encounter: Secondary | ICD-10-CM

## 2012-04-06 DIAGNOSIS — Z951 Presence of aortocoronary bypass graft: Secondary | ICD-10-CM | POA: Insufficient documentation

## 2012-04-06 DIAGNOSIS — S93499A Sprain of other ligament of unspecified ankle, initial encounter: Secondary | ICD-10-CM | POA: Insufficient documentation

## 2012-04-06 DIAGNOSIS — I10 Essential (primary) hypertension: Secondary | ICD-10-CM | POA: Insufficient documentation

## 2012-04-06 HISTORY — DX: Unilateral primary osteoarthritis, unspecified knee: M17.10

## 2012-04-06 HISTORY — DX: Strain of left Achilles tendon, initial encounter: S86.012A

## 2012-04-06 HISTORY — DX: Essential (primary) hypertension: I10

## 2012-04-06 MED ORDER — DOCUSATE SODIUM 100 MG PO CAPS: 100.0000 mg | ORAL_CAPSULE | Freq: Two times a day (BID) | ORAL | Status: AC

## 2012-04-06 MED ORDER — HYDROCODONE-ACETAMINOPHEN 5-325 MG PO TABS
2.0000 | ORAL_TABLET | Freq: Once | ORAL | Status: AC
  Administered 2012-04-06: 2 via ORAL
  Filled 2012-04-06: qty 2

## 2012-04-06 MED ORDER — HYDROCODONE-ACETAMINOPHEN 5-325 MG PO TABS: ORAL_TABLET | ORAL | Status: AC

## 2012-04-06 NOTE — ED Notes (Signed)
Assisted Ortho Tech with applying short leg splint

## 2012-04-06 NOTE — Progress Notes (Signed)
Orthopedic Tech Progress Note Patient Details:  Deborah Elliott 08-10-1946 [id number]  Ortho Devices Type of Ortho Device: Postop boot Ortho Device/Splint Location: left leg Ortho Device/Splint Interventions: Application   Chermaine Schnyder 04/06/2012, 7:01 PM

## 2012-04-06 NOTE — ED Notes (Signed)
Pt presents to department for evaluation of fall and LLE pain. Pt states she was outside cutting tree branches when she tripped and fell injuring LLE. Now states pain and discomfort. Able to wiggle digits. Pedal pulses intact. Sensation intact. No obvious deformity noted. 10/10 pain, becomes worse with movement and weight bearing. She is alert and oriented x4.

## 2012-04-06 NOTE — Discharge Instructions (Signed)
Partial (Incomplete) Achilles Tendon Rupture  Tendons are the tough fibrous and stretchy (elastic) tissues that connect muscle to bone. The Achilles tendon is the large cord-like structure (tendon) in the back of the leg, just above the foot. It attaches the large muscles of the lower leg to the heel bone. You can feel this as the large cord just above the heel. The diagnosis of incomplete Achilles tendon tear (rupture) is made by examination. X-rays will determine the extent of the damage (injury). The injury may be casted or immobilized for 6 to 10 weeks. An incomplete tear of the tendon may repair itself with rest and immobilization. Immobilization means that the injured tendon is kept in position with a cast or splint. It is not allowed to move. Once your caregiver feels you have healed well enough, he or she will provide exercises you can do to make the injured tendon feel better (rehabilitate). HOME CARE INSTRUCTIONS   Keep the leg elevated above the level of the heart (the center of the chest) at all times when not using the bathroom. Do not dangle the leg over a chair, couch, or bed. When lying down, elevate your leg on a couple pillows. Elevation prevents swelling and reduces pain.   Apply ice to the injury for 15 to 20 minutes, 3 to 4 times per day. Put the ice in a plastic bag and place a towel between the bag of ice and your skin, splint, or immobilization device.   Use crutches and move about only as instructed.   Avoid use other than gentle range of motion of the toes while the tendon is painful. Do not resume use until instructed by your caregiver. Usually, full rehabilitation will begin sometime after the casts or splints are removed. Then begin use gradually, as directed. Do not increase use to the point of pain. If pain does develop, decrease use and continue the above measures. Gradually increase activities that do not cause discomfort until you achieve normal use without pain.   Do  not drive a car until your caregiver specifically tells you it is safe to do so.   Only take over-the-counter or prescription medicines for pain, discomfort, or fever as directed by your caregiver.   If your caregiver has given you a follow-up appointment, it is very important to keep that appointment. Not keeping the appointment could result in a chronic or permanent injury, pain, and disability. If there is any problem keeping the appointment, you must call back to this facility for assistance.  SEEK MEDICAL CARE IF:   Your pain and swelling increase or pain is uncontrolled with medications.   You develop new unexplained problems (symptoms) or an increase of the symptoms which brought you to your caregiver.   You develop an inability to move your toes or foot, develop warmth and swelling in your foot, or begin running an unexplained temperature.  MAKE SURE YOU:   Understand these instructions.   Will watch your condition.   Will get help right away if you are not doing well or get worse.  Document Released: 07/16/2005 Document Revised: 09/25/2011 Document Reviewed: 05/09/2008 [employer] Patient Information 2012 [employer]. 

## 2012-04-06 NOTE — ED Notes (Signed)
ORTHO AWARE OF THE SPLINT NEEDED.

## 2012-04-06 NOTE — ED Provider Notes (Signed)
History   This chart was scribed for Deborah Elliott. Oletta Lamas, MD by Sofie Rower. The patient was seen in room TR11C/TR11C and the patient's care was started at 4:40PM.     CSN: [id number]  Arrival date & time 04/06/12  1548   None     Chief Complaint  Patient presents with  . Fall  . Leg Pain    (Consider location/radiation/quality/duration/timing/severity/associated sxs/prior treatment) Patient is a 66 y.o. female presenting with fall and leg pain. The history is provided by the patient. No language interpreter was used.  Fall The accident occurred 3 to 5 hours ago. The fall occurred in unknown circumstances. She fell from an unknown height. There was no blood loss. The pain is moderate. There was no entrapment after the fall. There was no alcohol use involved in the accident. Pertinent negatives include no vomiting and no loss of consciousness. The symptoms are aggravated by extension, standing and flexion. She has tried nothing for the symptoms. The treatment provided no relief.  Leg Pain     Deborah Elliott is a 66 y.o. female who presents to the Emergency Department complaining of moderate, episodic fall onset today with associated symptoms of LLE pain. The pt states she fell while she was outside cutting tree branches. Modifying factors include certain movements and weight bearing of the LLE which intensify the pain. Pt has a hx of hypertension, sulfur allergy.      Past Medical History  Diagnosis Date  . Hypertension   . Arthritis of knee     Past Surgical History  Procedure Date  . Coronary artery bypass graft     History  Substance Use Topics  . Smoking status: Never Smoker   . Smokeless tobacco: Not on file  . Alcohol Use: No    OB History    Grav Para Term Preterm Abortions TAB SAB Ect Mult Living                  Review of Systems  Gastrointestinal: Negative for vomiting.  Neurological: Negative for loss of consciousness.    Allergies  Morphine and related  and Sulfur  Home Medications   Current Outpatient Rx  Name Route Sig Dispense Refill  . ACETAMINOPHEN 500 MG PO TABS Oral Take 1,000 mg by mouth daily as needed. For pain    . CALTRATE 600+D PO Oral Take 1 tablet by mouth 2 (two) times daily.    Marland Kitchen CLOPIDOGREL BISULFATE 75 MG PO TABS Oral Take 75 mg by mouth daily.    Marland Kitchen EZETIMIBE-SIMVASTATIN 10-40 MG PO TABS Oral Take 1 tablet by mouth at bedtime.    . FOSINOPRIL SODIUM 20 MG PO TABS Oral Take 20 mg by mouth daily.    . FUROSEMIDE 40 MG PO TABS Oral Take 40 mg by mouth daily.    Marland Kitchen LANSOPRAZOLE 15 MG PO CPDR Oral Take 15 mg by mouth daily.    Marland Kitchen METOPROLOL SUCCINATE ER 50 MG PO TB24 Oral Take 50 mg by mouth daily. Take with or immediately following a meal.    . OSTEO BI-FLEX JOINT SHIELD PO Oral Take 1 capsule by mouth 2 (two) times daily.    Marland Kitchen POTASSIUM CHLORIDE CRYS ER 20 MEQ PO TBCR Oral Take 20 mEq by mouth daily.      BP 135/83  Pulse 72  Temp 98 F (36.7 C) (Oral)  Resp 14  SpO2 95%  Physical Exam  Nursing note and vitals reviewed. Constitutional: She is oriented to person,  place, and time. She appears well-developed and well-nourished.  Musculoskeletal: She exhibits tenderness (Posterior knee, calf, ). She exhibits no edema.       No deformity noted.   Neurological: She is alert and oriented to person, place, and time.  Skin: Skin is warm and dry. No rash noted.  Psychiatric: She has a normal mood and affect. Her behavior is normal.    ED Course  Procedures (including critical care time)  DIAGNOSTIC STUDIES: Oxygen Saturation is 95% on room air, adequate by my interpretation.    COORDINATION OF CARE:  4:43PM- EDP at bedside discusses treatment plan.    Labs Reviewed - No data to display Dg Tibia/fibula Left  04/06/2012  *RADIOLOGY REPORT*  Clinical Data: Fall  LEFT TIBIA AND FIBULA - 2 VIEW  Comparison: None.  Findings: Negative for fracture.  No acute bony abnormality. Degenerative change in the medial compartment  of the knee  IMPRESSION: Negative for fracture.  Original Report Authenticated By: Camelia Phenes, M.D.   Dg Ankle Complete Left  04/06/2012  *RADIOLOGY REPORT*  Clinical Data: Fall  LEFT ANKLE COMPLETE - 3+ VIEW  Comparison: None.  Findings: Negative for fracture.  Joint space is normal.  Negative for effusion.  Calcaneal spurring is present.  IMPRESSION: No acute abnormality.  Original Report Authenticated By: Camelia Phenes, M.D.      1. Achilles rupture, left       MDM  I personally performed the services described in this documentation, which was scribed in my presence. The recorded information has been reviewed and considered.   No direct fall, doubt fracture.  Exam and history is concerning for tendon or muscle injury.  I suspect partial achilles or soleus or gastroc injury on left leg.  Will place in posterior splint, wheelchair.  I have tried to page on call ortho for Dr. Lequita Halt multiple times over 1 hour, has not called back.  Pt and family know to call office to arrange close follow up.     Deborah Elliott. Oletta Lamas, MD 04/06/12 1610

## 2012-09-21 ENCOUNTER — Other Ambulatory Visit: Payer: Self-pay | Admitting: Internal Medicine

## 2012-09-21 DIAGNOSIS — Z1231 Encounter for screening mammogram for malignant neoplasm of breast: Secondary | ICD-10-CM

## 2013-01-12 ENCOUNTER — Other Ambulatory Visit: Payer: Self-pay

## 2013-01-12 DIAGNOSIS — Z1231 Encounter for screening mammogram for malignant neoplasm of breast: Secondary | ICD-10-CM

## 2013-02-04 ENCOUNTER — Ambulatory Visit
Admission: RE | Admit: 2013-02-04 | Discharge: 2013-02-04 | Disposition: A | Discharge status: Home / Self Care | Payer: Medicare Other | Source: Ambulatory Visit

## 2013-02-04 DIAGNOSIS — Z1231 Encounter for screening mammogram for malignant neoplasm of breast: Secondary | ICD-10-CM

## 2013-03-24 ENCOUNTER — Encounter: Payer: Self-pay | Admitting: Physician Assistant

## 2013-03-24 ENCOUNTER — Ambulatory Visit (INDEPENDENT_AMBULATORY_CARE_PROVIDER_SITE_OTHER): Payer: Medicare Other | Admitting: Physician Assistant

## 2013-03-24 VITALS — BP 140/80 | HR 67 | Ht 66.0 in | Wt 225.0 lb

## 2013-03-24 DIAGNOSIS — I2510 Atherosclerotic heart disease of native coronary artery without angina pectoris: Secondary | ICD-10-CM

## 2013-03-24 DIAGNOSIS — I10 Essential (primary) hypertension: Secondary | ICD-10-CM | POA: Insufficient documentation

## 2013-03-24 DIAGNOSIS — E785 Hyperlipidemia, unspecified: Secondary | ICD-10-CM | POA: Insufficient documentation

## 2013-03-24 DIAGNOSIS — E669 Obesity, unspecified: Secondary | ICD-10-CM | POA: Insufficient documentation

## 2013-03-24 NOTE — Patient Instructions (Signed)
Increase cardiovascular exercise as much as possible. Recommend 30-60 minutes daily.  Over you should increase the amount overtime.  Reduce your carbohydrate intake.  Follow up in 6 months with Dr. Allyson Sabal

## 2013-03-24 NOTE — Assessment & Plan Note (Signed)
Patient has no complaints of anginal symptoms and is on appropriate medications.

## 2013-03-24 NOTE — Assessment & Plan Note (Signed)
-   Crestor 

## 2013-03-24 NOTE — Assessment & Plan Note (Signed)
Patient is on appropriate medications at this time. 

## 2013-03-24 NOTE — Progress Notes (Signed)
Date:  03/24/2013   ID:  Deborah Elliott, DOB 11-04-1945, MRN [id number]  PCP:  Minda Meo, MD  Primary Cardiologist:  Allyson Sabal     History of Present Illness: Deborah Elliott is a 67 y.o. female was obese and is a retired Interior and spatial designer. His history of obstructive sleep apnea for which he uses a CPAP. Her history also includes coronary artery disease disease hypertension hyperlipidemia. She had PCI and stenting of her LAD in 3 places by Dr. Allyson Sabal with a Taxus drug eluting stents on 03/14/2010.  normal EF at that time. She did have residual 50-60% mid RCA lesion which treated medically. She had a stress test in August of 2011 which showed post stress ejection fraction of 62% with no ischemia.  Patient presents for a six-month followup. She appears to be doing quite well with no complaints. No chest pain, nausea, vomiting, shortness of breath orthopnea, PND, abdominal pain, dysuria, hematuria, hematochezia, melena, lower extremity edema   Wt Readings from Last 3 Encounters:  03/24/13 225 lb (102.059 kg)     Past Medical History  Diagnosis Date  . Hypertension   . Arthritis of knee     Current Outpatient Prescriptions  Medication Sig Dispense Refill  . acetaminophen (TYLENOL) 500 MG tablet Take 1,000 mg by mouth daily as needed. For pain      . Calcium Carbonate-Vitamin D (CALTRATE 600+D PO) Take 1 tablet by mouth 2 (two) times daily.      . Cetirizine HCl (ZYRTEC PO) Take by mouth.      . clopidogrel (PLAVIX) 75 MG tablet Take 75 mg by mouth daily.      . fosinopril (MONOPRIL) 20 MG tablet Take 20 mg by mouth daily.      . furosemide (LASIX) 40 MG tablet Take 40 mg by mouth daily.      . lansoprazole (PREVACID) 15 MG capsule Take 15 mg by mouth daily.      . metoprolol succinate (TOPROL-XL) 50 MG 24 hr tablet Take 50 mg by mouth daily. Take with or immediately following a meal.      . Misc Natural Products (OSTEO BI-FLEX JOINT SHIELD PO) Take 1 capsule by mouth 2 (two) times daily.        . potassium chloride SA (K-DUR,KLOR-CON) 20 MEQ tablet Take 20 mEq by mouth daily.      . Psyllium (METAMUCIL PO) Take by mouth.      . rosuvastatin (CRESTOR) 20 MG tablet Take 20 mg by mouth daily.       No current facility-administered medications for this visit.    Allergies:    Allergies  Allergen Reactions  . Morphine And Related Other (See Comments)    Reaction unknown per patient  . Sulfur Itching and Rash    Social History:  The patient  reports that she has quit smoking. She quit smokeless tobacco use about 43 years ago. She reports that she does not drink alcohol or use illicit drugs.   ROS:  Please see the history of present illness.  All other systems reviewed and negative.   PHYSICAL EXAM: VS:  BP 140/80  Pulse 67  Ht 5\' 6"  (1.676 m)  Wt 225 lb (102.059 kg)  BMI 36.33 kg/m2 Well nourished, well developed, in no acute distress HEENT: Pupils are equal round react to light accommodation extraocular movements are intact.  Neck: no JVDNo cervical lymphadenopathy. Cardiac: Regular rate and rhythm without murmurs rubs or gallops. Lungs:  clear to auscultation bilaterally, no  wheezing, rhonchi or rales Abd: soft, nontender, positive bowel sounds all quadrants, no hepatosplenomegaly Ext: no lower extremity edema.  2+ radial and dorsalis pedis pulses. Skin: warm and dry Neuro:  Grossly normal  EKG:  Sinus rhythm rate of 67 beats per minute. First degree AV block. Septal Q waves also in prior  ASSESSMENT AND PLAN:  Problem List Items Addressed This Visit   Coronary artery disease     Patient has no complaints of anginal symptoms and is on appropriate medications.    Relevant Medications      rosuvastatin (CRESTOR) 20 MG tablet   Dyslipidemia     Crestor    Relevant Medications      rosuvastatin (CRESTOR) 20 MG tablet   Essential hypertension     Patient is on appropriate medications at this time.    Relevant Medications      rosuvastatin (CRESTOR) 20 MG  tablet   Obesity (BMI 35.0-39.9 without comorbidity)    Other Visit Diagnoses   CAD (coronary artery disease)    -  Primary    Relevant Medications       rosuvastatin (CRESTOR) 20 MG tablet    Other Relevant Orders       EKG 12-Lead       Followup in 6 months with Dr. Allyson Sabal

## 2013-06-24 ENCOUNTER — Telehealth: Payer: Self-pay | Admitting: *Deleted

## 2013-06-24 NOTE — Telephone Encounter (Signed)
[facility] faxed over a form asking if Deborah Elliott is cleared for TKA-Medical and Lateral w/wo patella resurfacing by Dr Lequita Halt.  Dr Allyson Sabal reviewed chart and cleared her for surgery and okay for her to hold Plavix 5 days before.  Form faxed back to [name] at [facility] orth[facility].

## 2013-08-09 ENCOUNTER — Other Ambulatory Visit: Payer: Self-pay | Admitting: Orthopedic Surgery

## 2013-08-09 NOTE — H&P (Signed)
Deborah Elliott  DOB: 04/11/1946 Married / Language: English / Race: White Female  Date of Admission:  08/29/2013  Chief Complaint:  Right Knee Pain  History of Present Illness The patient is a 67 year old female who comes in for a preoperative History and Physical. The patient is scheduled for a right total knee arthroplasty to be performed by Dr. Frank V. Aluisio, MD at [facility] on 08/29/2013. The patient is a 67 year old female who presents with knee complaints. The patient reports right knee symptoms including: pain (anterior and lateral), swelling and giving way which began year(s) ago without any known injury. Symptoms are exacerbated by weight bearing and walking. Symptoms are relieved by ice. Note for "Knee pain": Patient states that she saw Dr.Aluisio 3yr ago and received cortisone injections. She said that the injection did not help. She is currently taking tylenol for pain. Her knee hurts constantly, but gets worse with weightbearing. Knee has been hurtin for a while now. She went onto the cane last year. It has been getting progressively worse. some swelling. Popping and buckling a lot. Some catching locking. Very stiff in the morning and after sitting a while. She has been seen several years ago. Tylenol helps some and she uses ice with some benefit. She is now ready to proced with knee repalcement. They have been treated conservatively in the past for the above stated problem and despite conservative measures, they continue to have progressive pain and severe functional limitations and dysfunction. They have failed non-operative management including home exercise, medications, and injections. It is felt that they would benefit from undergoing total joint replacement. Risks and benefits of the procedure have been discussed with the patient and they elect to proceed with surgery. There are no active contraindications to surgery such as ongoing infection or  rapidly progressive neurological disease.   Problem List Primary osteoarthritis of one knee (715.16)   Allergies SULFA   Family History Diabetes Mellitus. mother and grandfather mothers side Hypertension. mother Cerebrovascular Accident. mother Congestive Heart Failure. First Degree Relatives. mother Cancer. grandmother mothers side   Social History Alcohol use. never consumed alcohol Exercise. Exercises rarely; does other Drug/Alcohol Rehab (Previously). no Children. 2 Tobacco / smoke exposure. no Pain Contract. no Current work status. retired Drug/Alcohol Rehab (Currently). no Illicit drug use. no Tobacco use. Former smoker. former smoker; smoke(d) 1/2 pack(s) per day Marital status. married Living situation. live with spouse Post-Surgical Plans. Plan is to go home. Advance Directives. Living Will, Healthcare POA   Medication History Plavix ( Oral) Specific dose unknown - Active. Toprol XL (50MG Tablet ER 24HR, Oral) Active. Monopril (20MG Tablet, Oral) Active. Lasix (40MG Tablet, Oral) Active. Crestor (20MG Tablet, Oral) Active. Osteo Bi-Flex Adv Double St ( Oral) Specific dose unknown - Active. Caltrate 600+D (600-800MG-UNIT Tablet, Oral) Active. Prevacid (15MG Capsule DR, Oral) Active. ZyrTEC Allergy (10MG Capsule, Oral) Active. Tylenol (325MG Tablet, Oral) Active.   Past Surgical History Heart Stents. 3 Colon Polyp Removal - Colonoscopy Appendectomy Excision of Skin Cancer   Medical History Ruptured, tendon, Achilles (845.09) Pain, Joint, Ankle/Foot (719.47) Leg strain (844.9) Cardiac Arrhythmia Sleep Apnea. uses CPAP High blood pressure Coronary Artery Disease/Heart Disease Hypercholesterolemia Skin Cancer Vertigo Bronchitis Gastroesophageal Reflux Disease Hemorrhoids Urinary Tract Infection Measles Mumps   Review of Systems General:Not Present- Chills, Fever, Night Sweats, Fatigue, Weight Gain,  Weight Loss and Memory Loss. Skin:Not Present- Hives, Itching, Rash, Eczema and Lesions. HEENT:Not Present- Tinnitus, Headache, Double Vision, Visual Loss, Hearing Loss and   Dentures. Respiratory:Not Present- Shortness of breath with exertion, Shortness of breath at rest, Allergies, Coughing up blood and Chronic Cough. Cardiovascular:Not Present- Chest Pain, Racing/skipping heartbeats, Difficulty Breathing Lying Down, Murmur, Swelling and Palpitations. Gastrointestinal:Not Present- Bloody Stool, Heartburn, Abdominal Pain, Vomiting, Nausea, Constipation, Diarrhea, Difficulty Swallowing, Jaundice and Loss of appetitie. Female Genitourinary:Not Present- Blood in Urine, Urinary frequency, Weak urinary stream, Discharge, Flank Pain, Incontinence, Painful Urination, Urgency, Urinary Retention and Urinating at Night. Musculoskeletal:Not Present- Muscle Weakness, Muscle Pain, Joint Swelling, Joint Pain, Back Pain, Morning Stiffness and Spasms. Neurological:Not Present- Tremor, Dizziness, Blackout spells, Paralysis, Difficulty with balance and Weakness. Psychiatric:Not Present- Insomnia.   Vitals Pulse: 60 (Regular) Resp.: 16 (Unlabored) BP: 158/88 (Sitting, Left Arm, Standard)   Physical Exam The physical exam findings are as follows:  Note: Patient is a 67 year old feamle with continued knee pain. Patient is accompanied today by her husband Deborah Elliott.   General Mental Status - Alert, cooperative and good historian. General Appearance- pleasant. Not in acute distress. Orientation- Oriented X3. Build & Nutrition- Well nourished and Well developed.   Head and Neck Head- normocephalic, atraumatic . Neck Global Assessment- supple. no bruit auscultated on the right and no bruit auscultated on the left.   Eye Vision- Wears corrective lenses. Pupil- Bilateral- Regular and Round. Motion- Bilateral- EOMI.   Chest and Lung Exam Auscultation: Breath sounds:-  clear at anterior chest wall and - clear at posterior chest wall. Adventitious sounds:- No Adventitious sounds.   Cardiovascular Auscultation:Rhythm- Regular rate and rhythm. Heart Sounds- S1 WNL and S2 WNL. Murmurs & Other Heart Sounds:Auscultation of the heart reveals - No Murmurs.   Abdomen Palpation/Percussion:Tenderness- Abdomen is non-tender to palpation. Rigidity (guarding)- Abdomen is soft. Auscultation:Auscultation of the abdomen reveals - Bowel sounds normal.   Female Genitourinary Not done, not pertinent to present illness  Musculoskeletal Well developed female in no distress. Right knee shows no effusion. She has a varus deformity. Range of motion is 5 to 125. Tenderness medial greater than lateral. No instability noted.  RADIOGRAPHS: AP both knees and lateral of the right show bone on bone arthritis in the right knee medial and patellofemoral compartments. The left knee is unremarkable.   Assessment & Plan Primary osteoarthritis of one knee (715.16) Impression: Right Knee  Note: Plan is for a Right Total Knee Replacement by Dr. Aluisio.  Plan is to go home.  PCP - Dr. Aronson Cardiology - Dr. Berry - Patient has been seen preoperatively and felt to be stable for surgery. The patient was instructed to stop her Plavix 5 days prior to surgery.  The patient will not receive TXA (tranexamic acid) due to: Coronary Heart Disease  Signed electronically by Riham Polyakov L Philisha Weinel, III PA-C 

## 2013-08-12 NOTE — Progress Notes (Signed)
Need orders when able please - pt coming for preop TUES 08/23/13 - thank you

## 2013-08-15 NOTE — Progress Notes (Signed)
Need orders in EPIC.  Surgery scheduled for 08/29/13.  Preop on 08/23/13 at 1100am.  Thank You.

## 2013-08-16 ENCOUNTER — Encounter (HOSPITAL_COMMUNITY): Payer: Self-pay | Admitting: Pharmacy Technician

## 2013-08-18 ENCOUNTER — Other Ambulatory Visit: Payer: Self-pay | Admitting: Orthopedic Surgery

## 2013-08-18 NOTE — Progress Notes (Signed)
Preoperative surgical orders have been place into the Epic hospital system for [employer] on 08/18/2013, 12:48 PM  by Patrica Duel for surgery on 08/29/2013.  Preop Total Knee orders including Experal, PO Tylenol, and IV Decadron as long as there are no contraindications to the above medications. Avel Peace, PA-C

## 2013-08-22 ENCOUNTER — Other Ambulatory Visit (HOSPITAL_COMMUNITY): Payer: Self-pay | Admitting: *Deleted

## 2013-08-23 ENCOUNTER — Encounter (HOSPITAL_COMMUNITY): Payer: Self-pay

## 2013-08-23 ENCOUNTER — Encounter (INDEPENDENT_AMBULATORY_CARE_PROVIDER_SITE_OTHER): Payer: Self-pay

## 2013-08-23 ENCOUNTER — Encounter (HOSPITAL_COMMUNITY)
Admission: RE | Admit: 2013-08-23 | Discharge: 2013-08-23 | Disposition: A | Discharge status: Home / Self Care | Payer: Medicare Other | Source: Ambulatory Visit | Attending: Orthopedic Surgery | Admitting: Orthopedic Surgery

## 2013-08-23 ENCOUNTER — Ambulatory Visit (HOSPITAL_COMMUNITY)
Admission: RE | Admit: 2013-08-23 | Discharge: 2013-08-23 | Disposition: A | Discharge status: Home / Self Care | Payer: Medicare Other | Source: Ambulatory Visit | Attending: Orthopedic Surgery | Admitting: Orthopedic Surgery

## 2013-08-23 DIAGNOSIS — M1710 Unilateral primary osteoarthritis, unspecified knee: Secondary | ICD-10-CM | POA: Insufficient documentation

## 2013-08-23 DIAGNOSIS — Z01812 Encounter for preprocedural laboratory examination: Secondary | ICD-10-CM | POA: Insufficient documentation

## 2013-08-23 DIAGNOSIS — I2510 Atherosclerotic heart disease of native coronary artery without angina pectoris: Secondary | ICD-10-CM | POA: Insufficient documentation

## 2013-08-23 DIAGNOSIS — G4730 Sleep apnea, unspecified: Secondary | ICD-10-CM | POA: Insufficient documentation

## 2013-08-23 DIAGNOSIS — Z01818 Encounter for other preprocedural examination: Secondary | ICD-10-CM | POA: Insufficient documentation

## 2013-08-23 DIAGNOSIS — I10 Essential (primary) hypertension: Secondary | ICD-10-CM | POA: Insufficient documentation

## 2013-08-23 HISTORY — DX: Personal history of other malignant neoplasm of skin: Z85.828

## 2013-08-23 HISTORY — DX: Sleep apnea, unspecified: G47.30

## 2013-08-23 HISTORY — DX: Gastro-esophageal reflux disease without esophagitis: K21.9

## 2013-08-23 HISTORY — DX: Cardiac arrhythmia, unspecified: I49.9

## 2013-08-23 HISTORY — DX: Malignant (primary) neoplasm, unspecified: C80.1

## 2013-08-23 HISTORY — DX: Atherosclerotic heart disease of native coronary artery without angina pectoris: I25.10

## 2013-08-23 HISTORY — DX: Cramp and spasm: R25.2

## 2013-08-23 LAB — SURGICAL PCR SCREEN
MRSA, PCR: NEGATIVE
Staphylococcus aureus: NEGATIVE

## 2013-08-23 LAB — ABO/RH: ABO/RH(D): O NEG

## 2013-08-23 LAB — PROTIME-INR
INR: 1.02 (ref 0.00–1.49)
Prothrombin Time: 13.2 seconds (ref 11.6–15.2)

## 2013-08-23 LAB — APTT: aPTT: 34 seconds (ref 24–37)

## 2013-08-23 NOTE — Patient Instructions (Addendum)
Quincie R Klosinski  08/23/2013                           YOUR PROCEDURE IS SCHEDULED ON:  08/29/13               PLEASE REPORT TO SHORT STAY CENTER AT :  7:30 AM               CALL THIS NUMBER IF ANY PROBLEMS THE DAY OF SURGERY :               [phone]                      REMEMBER:   Do not eat food or drink liquids AFTER MIDNIGHT   Take these medicines the morning of surgery with A SIP OF WATER:  PEVACID / METOPROLOL / MAY TAKE TYLENOL / ZYRTEC IF NEEDED   Do not wear jewelry, make-up   Do not wear lotions, powders, or perfumes.   Do not shave legs or underarms 12 hrs. before surgery (men may shave face)  Do not bring valuables to the hospital.  Contacts, dentures or bridgework may not be worn into surgery.  Leave suitcase in the car. After surgery it may be brought to your room.  For patients admitted to the hospital more than one night, checkout time is 11:00                          The day of discharge.   Patients discharged the day of surgery will not be allowed to drive home                             If going home same day of surgery, must have someone stay with you first                           24 hrs at home and arrange for some one to drive you home from hospital.    Special Instructions:   Please read over the following fact sheets that you were given:               1. MRSA  INFORMATION                      2. [facility] PREPARING FOR SURGERY SHEET               3. INCENTIVE SPIROMETER               4. BRING C-PAP MASK AND TUBING TO HOSPITAL                                                X_____________________________________________________________________        Failure to follow these instructions may result in cancellation of your surgery

## 2013-08-29 ENCOUNTER — Encounter (HOSPITAL_COMMUNITY)
Admission: RE | Disposition: A | Discharge status: Home Health | Payer: Self-pay | Source: Ambulatory Visit | Attending: Orthopedic Surgery

## 2013-08-29 ENCOUNTER — Encounter (HOSPITAL_COMMUNITY): Payer: Medicare Other | Admitting: Anesthesiology

## 2013-08-29 ENCOUNTER — Inpatient Hospital Stay (HOSPITAL_COMMUNITY): Payer: Medicare Other | Admitting: Anesthesiology

## 2013-08-29 ENCOUNTER — Inpatient Hospital Stay (HOSPITAL_COMMUNITY)
Admission: RE | Admit: 2013-08-29 | Discharge: 2013-09-02 | DRG: 470 | Disposition: A | Discharge status: Home Health | Payer: Medicare Other | Source: Ambulatory Visit | Attending: Orthopedic Surgery | Admitting: Orthopedic Surgery

## 2013-08-29 ENCOUNTER — Encounter (HOSPITAL_COMMUNITY): Payer: Self-pay | Admitting: *Deleted

## 2013-08-29 DIAGNOSIS — Z87891 Personal history of nicotine dependence: Secondary | ICD-10-CM

## 2013-08-29 DIAGNOSIS — Z85828 Personal history of other malignant neoplasm of skin: Secondary | ICD-10-CM

## 2013-08-29 DIAGNOSIS — Z6841 Body Mass Index (BMI) 40.0 and over, adult: Secondary | ICD-10-CM

## 2013-08-29 DIAGNOSIS — M1710 Unilateral primary osteoarthritis, unspecified knee: Principal | ICD-10-CM | POA: Diagnosis present

## 2013-08-29 DIAGNOSIS — G4730 Sleep apnea, unspecified: Secondary | ICD-10-CM | POA: Diagnosis present

## 2013-08-29 DIAGNOSIS — K219 Gastro-esophageal reflux disease without esophagitis: Secondary | ICD-10-CM | POA: Diagnosis present

## 2013-08-29 DIAGNOSIS — Z9861 Coronary angioplasty status: Secondary | ICD-10-CM

## 2013-08-29 DIAGNOSIS — E785 Hyperlipidemia, unspecified: Secondary | ICD-10-CM

## 2013-08-29 DIAGNOSIS — E7800 Pure hypercholesterolemia, unspecified: Secondary | ICD-10-CM | POA: Diagnosis present

## 2013-08-29 DIAGNOSIS — E669 Obesity, unspecified: Secondary | ICD-10-CM

## 2013-08-29 DIAGNOSIS — Z96651 Presence of right artificial knee joint: Secondary | ICD-10-CM

## 2013-08-29 DIAGNOSIS — I10 Essential (primary) hypertension: Secondary | ICD-10-CM | POA: Diagnosis present

## 2013-08-29 DIAGNOSIS — D62 Acute posthemorrhagic anemia: Secondary | ICD-10-CM | POA: Diagnosis not present

## 2013-08-29 DIAGNOSIS — I2510 Atherosclerotic heart disease of native coronary artery without angina pectoris: Secondary | ICD-10-CM

## 2013-08-29 HISTORY — PX: TOTAL KNEE ARTHROPLASTY: SHX125

## 2013-08-29 LAB — TYPE AND SCREEN
ABO/RH(D): O NEG
Antibody Screen: NEGATIVE

## 2013-08-29 SURGERY — ARTHROPLASTY, KNEE, TOTAL
Anesthesia: General | Site: Knee | Laterality: Right | Wound class: Clean

## 2013-08-29 MED ORDER — CISATRACURIUM BESYLATE (PF) 10 MG/5ML IV SOLN
INTRAVENOUS | Status: DC | PRN
  Administered 2013-08-29: 6 mg via INTRAVENOUS

## 2013-08-29 MED ORDER — DIPHENHYDRAMINE HCL 12.5 MG/5ML PO ELIX
12.5000 mg | ORAL_SOLUTION | ORAL | Status: DC | PRN
  Administered 2013-08-29 – 2013-08-30 (×2): 25 mg via ORAL
  Filled 2013-08-29 (×2): qty 10

## 2013-08-29 MED ORDER — FENTANYL CITRATE 0.05 MG/ML IJ SOLN
INTRAMUSCULAR | Status: DC | PRN
  Administered 2013-08-29 (×2): 50 ug via INTRAVENOUS

## 2013-08-29 MED ORDER — DEXAMETHASONE SODIUM PHOSPHATE 10 MG/ML IJ SOLN
10.0000 mg | Freq: Every day | INTRAMUSCULAR | Status: AC
  Filled 2013-08-29: qty 1

## 2013-08-29 MED ORDER — PROPOFOL 10 MG/ML IV BOLUS
INTRAVENOUS | Status: DC | PRN
  Administered 2013-08-29: 100 mg via INTRAVENOUS
  Administered 2013-08-29: 40 mg via INTRAVENOUS

## 2013-08-29 MED ORDER — POLYETHYLENE GLYCOL 3350 17 G PO PACK: 17.0000 g | PACK | Freq: Every day | ORAL | Status: DC | PRN

## 2013-08-29 MED ORDER — ACETAMINOPHEN 500 MG PO TABS
1000.0000 mg | ORAL_TABLET | Freq: Once | ORAL | Status: AC
  Administered 2013-08-29: 1000 mg via ORAL
  Filled 2013-08-29: qty 2

## 2013-08-29 MED ORDER — LIDOCAINE HCL (CARDIAC) 20 MG/ML IV SOLN
INTRAVENOUS | Status: DC | PRN
  Administered 2013-08-29: 100 mg via INTRAVENOUS

## 2013-08-29 MED ORDER — POTASSIUM CHLORIDE CRYS ER 20 MEQ PO TBCR
20.0000 meq | EXTENDED_RELEASE_TABLET | Freq: Every day | ORAL | Status: DC
  Administered 2013-08-29 – 2013-09-02 (×5): 20 meq via ORAL
  Filled 2013-08-29 (×5): qty 1

## 2013-08-29 MED ORDER — KETOROLAC TROMETHAMINE 15 MG/ML IJ SOLN
7.5000 mg | Freq: Four times a day (QID) | INTRAMUSCULAR | Status: AC | PRN
  Administered 2013-08-29: 7.5 mg via INTRAVENOUS
  Filled 2013-08-29 (×2): qty 1

## 2013-08-29 MED ORDER — MENTHOL 3 MG MT LOZG
1.0000 | LOZENGE | OROMUCOSAL | Status: DC | PRN
  Filled 2013-08-29: qty 9

## 2013-08-29 MED ORDER — PSYLLIUM 95 % PO PACK
1.0000 | PACK | Freq: Every day | ORAL | Status: DC
  Administered 2013-08-29 – 2013-09-02 (×4): 1 via ORAL
  Filled 2013-08-29 (×5): qty 1

## 2013-08-29 MED ORDER — OXYCODONE HCL 5 MG PO TABS
5.0000 mg | ORAL_TABLET | ORAL | Status: DC | PRN
  Administered 2013-08-29: 19:00:00 via ORAL
  Administered 2013-08-29 – 2013-09-01 (×13): 10 mg via ORAL
  Filled 2013-08-29 (×13): qty 2

## 2013-08-29 MED ORDER — METOPROLOL SUCCINATE ER 50 MG PO TB24
50.0000 mg | ORAL_TABLET | Freq: Once | ORAL | Status: DC
  Filled 2013-08-29: qty 1

## 2013-08-29 MED ORDER — HYDROMORPHONE HCL PF 1 MG/ML IJ SOLN: 0.5000 mg | INTRAMUSCULAR | Status: DC | PRN

## 2013-08-29 MED ORDER — LACTATED RINGERS IV SOLN
INTRAVENOUS | Status: DC
  Administered 2013-08-29: 1000 mL via INTRAVENOUS
  Administered 2013-08-29: 11:00:00 via INTRAVENOUS

## 2013-08-29 MED ORDER — CEFAZOLIN SODIUM 1-5 GM-% IV SOLN
1.0000 g | Freq: Four times a day (QID) | INTRAVENOUS | Status: AC
  Administered 2013-08-29 (×2): 1 g via INTRAVENOUS
  Filled 2013-08-29 (×2): qty 50

## 2013-08-29 MED ORDER — ACETAMINOPHEN 650 MG RE SUPP: 650.0000 mg | Freq: Four times a day (QID) | RECTAL | Status: DC | PRN

## 2013-08-29 MED ORDER — FLEET ENEMA 7-19 GM/118ML RE ENEM: 1.0000 | ENEMA | Freq: Once | RECTAL | Status: AC | PRN

## 2013-08-29 MED ORDER — POTASSIUM CHLORIDE IN NACL 20-0.9 MEQ/L-% IV SOLN
INTRAVENOUS | Status: AC
  Filled 2013-08-29: qty 1000

## 2013-08-29 MED ORDER — GLYCOPYRROLATE 0.2 MG/ML IJ SOLN
INTRAMUSCULAR | Status: DC | PRN
  Administered 2013-08-29: .6 mg via INTRAVENOUS

## 2013-08-29 MED ORDER — ONDANSETRON HCL 4 MG/2ML IJ SOLN
INTRAMUSCULAR | Status: DC | PRN
  Administered 2013-08-29: 4 mg via INTRAVENOUS

## 2013-08-29 MED ORDER — BUPIVACAINE HCL (PF) 0.25 % IJ SOLN
INTRAMUSCULAR | Status: AC
  Filled 2013-08-29: qty 30

## 2013-08-29 MED ORDER — METOCLOPRAMIDE HCL 5 MG/ML IJ SOLN: 5.0000 mg | Freq: Three times a day (TID) | INTRAMUSCULAR | Status: DC | PRN

## 2013-08-29 MED ORDER — LORATADINE 10 MG PO TABS
10.0000 mg | ORAL_TABLET | Freq: Every day | ORAL | Status: DC
  Administered 2013-08-29 – 2013-09-02 (×5): 10 mg via ORAL
  Filled 2013-08-29 (×5): qty 1

## 2013-08-29 MED ORDER — SODIUM CHLORIDE 0.9 % IJ SOLN
INTRAMUSCULAR | Status: AC
  Filled 2013-08-29: qty 50

## 2013-08-29 MED ORDER — DEXAMETHASONE SODIUM PHOSPHATE 10 MG/ML IJ SOLN
10.0000 mg | Freq: Once | INTRAMUSCULAR | Status: DC
Start: 2013-08-29 — End: 2013-08-29

## 2013-08-29 MED ORDER — HYDRALAZINE HCL 20 MG/ML IJ SOLN
INTRAMUSCULAR | Status: DC | PRN
  Administered 2013-08-29: 5 mg via INTRAVENOUS

## 2013-08-29 MED ORDER — HYDROMORPHONE HCL PF 1 MG/ML IJ SOLN
0.2500 mg | INTRAMUSCULAR | Status: DC | PRN
  Administered 2013-08-29 (×2): 0.5 mg via INTRAVENOUS

## 2013-08-29 MED ORDER — METOCLOPRAMIDE HCL 10 MG PO TABS: 5.0000 mg | ORAL_TABLET | Freq: Three times a day (TID) | ORAL | Status: DC | PRN

## 2013-08-29 MED ORDER — BUPIVACAINE HCL 0.25 % IJ SOLN
INTRAMUSCULAR | Status: DC | PRN
  Administered 2013-08-29: 20 mL

## 2013-08-29 MED ORDER — SODIUM CHLORIDE 0.9 % IR SOLN
Status: DC | PRN
  Administered 2013-08-29: 1000 mL

## 2013-08-29 MED ORDER — CEFAZOLIN SODIUM-DEXTROSE 2-3 GM-% IV SOLR
INTRAVENOUS | Status: AC
Start: 2013-08-29 — End: 2013-08-29
  Filled 2013-08-29: qty 50

## 2013-08-29 MED ORDER — SODIUM CHLORIDE 0.9 % IV SOLN: INTRAVENOUS | Status: DC

## 2013-08-29 MED ORDER — DOCUSATE SODIUM 100 MG PO CAPS
100.0000 mg | ORAL_CAPSULE | Freq: Two times a day (BID) | ORAL | Status: DC
  Administered 2013-08-29 – 2013-09-02 (×8): 100 mg via ORAL

## 2013-08-29 MED ORDER — MIDAZOLAM HCL 5 MG/5ML IJ SOLN
INTRAMUSCULAR | Status: DC | PRN
  Administered 2013-08-29: 2 mg via INTRAVENOUS

## 2013-08-29 MED ORDER — FUROSEMIDE 20 MG PO TABS
20.0000 mg | ORAL_TABLET | Freq: Every morning | ORAL | Status: DC
  Administered 2013-08-29 – 2013-09-01 (×4): 20 mg via ORAL
  Filled 2013-08-29 (×5): qty 1

## 2013-08-29 MED ORDER — RIVAROXABAN 10 MG PO TABS
10.0000 mg | ORAL_TABLET | Freq: Every day | ORAL | Status: DC
  Administered 2013-08-30 – 2013-09-02 (×4): 10 mg via ORAL
  Filled 2013-08-29 (×5): qty 1

## 2013-08-29 MED ORDER — ONDANSETRON HCL 4 MG/2ML IJ SOLN
4.0000 mg | Freq: Four times a day (QID) | INTRAMUSCULAR | Status: DC | PRN
  Administered 2013-08-29: 18:00:00 4 mg via INTRAVENOUS
  Filled 2013-08-29: qty 2

## 2013-08-29 MED ORDER — ONDANSETRON HCL 4 MG PO TABS: 4.0000 mg | ORAL_TABLET | Freq: Four times a day (QID) | ORAL | Status: DC | PRN

## 2013-08-29 MED ORDER — PROMETHAZINE HCL 25 MG/ML IJ SOLN: 6.2500 mg | INTRAMUSCULAR | Status: DC | PRN

## 2013-08-29 MED ORDER — PHENOL 1.4 % MT LIQD: 1.0000 | OROMUCOSAL | Status: DC | PRN

## 2013-08-29 MED ORDER — TRAMADOL HCL 50 MG PO TABS
50.0000 mg | ORAL_TABLET | Freq: Four times a day (QID) | ORAL | Status: DC | PRN
  Administered 2013-09-01 – 2013-09-02 (×2): 100 mg via ORAL
  Filled 2013-08-29 (×2): qty 2

## 2013-08-29 MED ORDER — SUCCINYLCHOLINE CHLORIDE 20 MG/ML IJ SOLN
INTRAMUSCULAR | Status: DC | PRN
  Administered 2013-08-29: 80 mg via INTRAVENOUS

## 2013-08-29 MED ORDER — METHOCARBAMOL 100 MG/ML IJ SOLN
500.0000 mg | Freq: Four times a day (QID) | INTRAVENOUS | Status: DC | PRN
  Administered 2013-08-29: 500 mg via INTRAVENOUS
  Filled 2013-08-29: qty 5

## 2013-08-29 MED ORDER — NEOSTIGMINE METHYLSULFATE 1 MG/ML IJ SOLN
INTRAMUSCULAR | Status: DC | PRN
  Administered 2013-08-29: 4 mg via INTRAVENOUS

## 2013-08-29 MED ORDER — KETAMINE HCL 10 MG/ML IJ SOLN
INTRAMUSCULAR | Status: DC | PRN
  Administered 2013-08-29: 10 mg via INTRAVENOUS
  Administered 2013-08-29: 30 mg via INTRAVENOUS

## 2013-08-29 MED ORDER — ACETAMINOPHEN 325 MG PO TABS
650.0000 mg | ORAL_TABLET | Freq: Four times a day (QID) | ORAL | Status: DC | PRN
  Administered 2013-08-30: 16:00:00 650 mg via ORAL
  Filled 2013-08-29: qty 2

## 2013-08-29 MED ORDER — PANTOPRAZOLE SODIUM 20 MG PO TBEC
20.0000 mg | DELAYED_RELEASE_TABLET | Freq: Every day | ORAL | Status: DC
  Administered 2013-08-29: 20 mg via ORAL
  Filled 2013-08-29 (×2): qty 1

## 2013-08-29 MED ORDER — DEXAMETHASONE 6 MG PO TABS
10.0000 mg | ORAL_TABLET | Freq: Every day | ORAL | Status: AC
  Administered 2013-08-30: 10 mg via ORAL
  Filled 2013-08-29: qty 1

## 2013-08-29 MED ORDER — HYDROMORPHONE HCL PF 1 MG/ML IJ SOLN
INTRAMUSCULAR | Status: AC
  Filled 2013-08-29: qty 1

## 2013-08-29 MED ORDER — BUPIVACAINE LIPOSOME 1.3 % IJ SUSP
20.0000 mL | Freq: Once | INTRAMUSCULAR | Status: DC
  Filled 2013-08-29: qty 20

## 2013-08-29 MED ORDER — CHLORHEXIDINE GLUCONATE 4 % EX LIQD: 60.0000 mL | Freq: Once | CUTANEOUS | Status: DC

## 2013-08-29 MED ORDER — ACETAMINOPHEN 500 MG PO TABS
1000.0000 mg | ORAL_TABLET | Freq: Four times a day (QID) | ORAL | Status: AC
  Administered 2013-08-29 – 2013-08-30 (×4): 1000 mg via ORAL
  Filled 2013-08-29 (×4): qty 2

## 2013-08-29 MED ORDER — DEXAMETHASONE SODIUM PHOSPHATE 10 MG/ML IJ SOLN
INTRAMUSCULAR | Status: DC | PRN
  Administered 2013-08-29: 10 mg via INTRAVENOUS

## 2013-08-29 MED ORDER — POTASSIUM CHLORIDE IN NACL 20-0.9 MEQ/L-% IV SOLN
INTRAVENOUS | Status: DC
  Administered 2013-08-29 – 2013-08-30 (×2): via INTRAVENOUS
  Filled 2013-08-29 (×3): qty 1000

## 2013-08-29 MED ORDER — METOPROLOL SUCCINATE ER 50 MG PO TB24
50.0000 mg | ORAL_TABLET | Freq: Every morning | ORAL | Status: DC
  Administered 2013-08-29 – 2013-09-02 (×5): 50 mg via ORAL
  Filled 2013-08-29 (×5): qty 1

## 2013-08-29 MED ORDER — BISACODYL 10 MG RE SUPP: 10.0000 mg | Freq: Every day | RECTAL | Status: DC | PRN

## 2013-08-29 MED ORDER — SUFENTANIL CITRATE 50 MCG/ML IV SOLN
INTRAVENOUS | Status: DC | PRN
  Administered 2013-08-29 (×2): 10 ug via INTRAVENOUS
  Administered 2013-08-29: 20 ug via INTRAVENOUS
  Administered 2013-08-29: 10 ug via INTRAVENOUS

## 2013-08-29 MED ORDER — CEFAZOLIN SODIUM-DEXTROSE 2-3 GM-% IV SOLR
2.0000 g | INTRAVENOUS | Status: AC
  Administered 2013-08-29: 2 g via INTRAVENOUS

## 2013-08-29 MED ORDER — METHOCARBAMOL 500 MG PO TABS
500.0000 mg | ORAL_TABLET | Freq: Four times a day (QID) | ORAL | Status: DC | PRN
  Administered 2013-08-29 – 2013-09-02 (×6): 500 mg via ORAL
  Filled 2013-08-29 (×6): qty 1

## 2013-08-29 MED ORDER — BUPIVACAINE LIPOSOME 1.3 % IJ SUSP
INTRAMUSCULAR | Status: DC | PRN
  Administered 2013-08-29: 50 mL

## 2013-08-29 SURGICAL SUPPLY — 57 items
BAG ZIPLOCK 12X15 (MISCELLANEOUS) ×2 IMPLANT
BANDAGE ELASTIC 6 VELCRO ST LF (GAUZE/BANDAGES/DRESSINGS) ×2 IMPLANT
BANDAGE ESMARK 6X9 LF (GAUZE/BANDAGES/DRESSINGS) ×1 IMPLANT
BLADE SAG 18X100X1.27 (BLADE) ×2 IMPLANT
BLADE SAW SGTL 11.0X1.19X90.0M (BLADE) ×2 IMPLANT
BNDG ESMARK 6X9 LF (GAUZE/BANDAGES/DRESSINGS) ×2
BOWL SMART MIX CTS (DISPOSABLE) ×2 IMPLANT
CAPT RP KNEE ×2 IMPLANT
CEMENT HV SMART SET (Cement) ×4 IMPLANT
CLOTH BEACON ORANGE TIMEOUT ST (SAFETY) ×2 IMPLANT
CUFF TOURN SGL QUICK 34 (TOURNIQUET CUFF) ×1
CUFF TRNQT CYL 34X4X40X1 (TOURNIQUET CUFF) ×1 IMPLANT
DECANTER SPIKE VIAL GLASS SM (MISCELLANEOUS) ×2 IMPLANT
DRAPE EXTREMITY T 121X128X90 (DRAPE) ×2 IMPLANT
DRAPE POUCH INSTRU U-SHP 10X18 (DRAPES) ×2 IMPLANT
DRAPE U-SHAPE 47X51 STRL (DRAPES) ×2 IMPLANT
DRESSING ADAPTIC 1/2  N-ADH (PACKING) ×2 IMPLANT
DRSG ADAPTIC 3X8 NADH LF (GAUZE/BANDAGES/DRESSINGS) ×2 IMPLANT
DRSG PAD ABDOMINAL 8X10 ST (GAUZE/BANDAGES/DRESSINGS) ×2 IMPLANT
DURAPREP 26ML APPLICATOR (WOUND CARE) ×2 IMPLANT
ELECT REM PT RETURN 9FT ADLT (ELECTROSURGICAL) ×2
ELECTRODE REM PT RTRN 9FT ADLT (ELECTROSURGICAL) ×1 IMPLANT
EVACUATOR 1/8 PVC DRAIN (DRAIN) ×2 IMPLANT
FACESHIELD LNG OPTICON STERILE (SAFETY) ×10 IMPLANT
GAUZE SPONGE 4X4 16PLY XRAY LF (GAUZE/BANDAGES/DRESSINGS) ×2 IMPLANT
GLOVE BIO SURGEON STRL SZ7.5 (GLOVE) IMPLANT
GLOVE BIO SURGEON STRL SZ8 (GLOVE) ×2 IMPLANT
GLOVE BIOGEL PI IND STRL 8 (GLOVE) ×2 IMPLANT
GLOVE BIOGEL PI INDICATOR 8 (GLOVE) ×2
GLOVE SURG SS PI 6.5 STRL IVOR (GLOVE) IMPLANT
GOWN PREVENTION PLUS LG XLONG (DISPOSABLE) ×2 IMPLANT
GOWN STRL REIN XL XLG (GOWN DISPOSABLE) IMPLANT
HANDPIECE INTERPULSE COAX TIP (DISPOSABLE) ×1
IMMOBILIZER KNEE 20 (SOFTGOODS) ×2
IMMOBILIZER KNEE 20 THIGH 36 (SOFTGOODS) ×1 IMPLANT
KIT BASIN OR (CUSTOM PROCEDURE TRAY) ×2 IMPLANT
MANIFOLD NEPTUNE II (INSTRUMENTS) ×2 IMPLANT
NDL SAFETY ECLIPSE 18X1.5 (NEEDLE) ×2 IMPLANT
NEEDLE HYPO 18GX1.5 SHARP (NEEDLE) ×2
NS IRRIG 1000ML POUR BTL (IV SOLUTION) ×2 IMPLANT
PACK TOTAL JOINT (CUSTOM PROCEDURE TRAY) ×2 IMPLANT
PADDING CAST COTTON 6X4 STRL (CAST SUPPLIES) ×2 IMPLANT
POSITIONER SURGICAL ARM (MISCELLANEOUS) ×2 IMPLANT
SET HNDPC FAN SPRY TIP SCT (DISPOSABLE) ×1 IMPLANT
SPONGE GAUZE 4X4 12PLY (GAUZE/BANDAGES/DRESSINGS) ×2 IMPLANT
STRIP CLOSURE SKIN 1/2X4 (GAUZE/BANDAGES/DRESSINGS) ×2 IMPLANT
SUCTION FRAZIER 12FR DISP (SUCTIONS) ×2 IMPLANT
SUT MNCRL AB 4-0 PS2 18 (SUTURE) ×2 IMPLANT
SUT VIC AB 2-0 CT1 27 (SUTURE) ×3
SUT VIC AB 2-0 CT1 TAPERPNT 27 (SUTURE) ×3 IMPLANT
SUT VLOC 180 0 24IN GS25 (SUTURE) ×2 IMPLANT
SYR 20CC LL (SYRINGE) ×2 IMPLANT
SYR 50ML LL SCALE MARK (SYRINGE) ×2 IMPLANT
TOWEL OR 17X26 10 PK STRL BLUE (TOWEL DISPOSABLE) ×4 IMPLANT
TRAY FOLEY CATH 14FRSI W/METER (CATHETERS) ×2 IMPLANT
WATER STERILE IRR 1500ML POUR (IV SOLUTION) ×2 IMPLANT
WRAP KNEE MAXI GEL POST OP (GAUZE/BANDAGES/DRESSINGS) ×2 IMPLANT

## 2013-08-29 NOTE — Anesthesia Preprocedure Evaluation (Addendum)
Anesthesia Evaluation  Patient identified by MRN, date of birth, ID band Patient awake    Reviewed: Allergy & Precautions, H&P , NPO status , Patient's Chart, lab work & pertinent test results  Airway Mallampati: II TM Distance: >3 FB Neck ROM: Full    Dental no notable dental hx.    Pulmonary sleep apnea , former smoker,  CXR: No acute abnormalities. breath sounds clear to auscultation  Pulmonary exam normal       Cardiovascular Exercise Tolerance: Good hypertension, Pt. on medications + CAD and + Cardiac Stents + dysrhythmias Rhythm:Regular Rate:Normal  Recent clearance Dr. Allyson Sabal. No current cardiopulmonary symptoms.  ECG: First degree AV block.   Neuro/Psych negative neurological ROS  negative psych ROS   GI/Hepatic negative GI ROS, Neg liver ROS, GERD-  ,  Endo/Other  negative endocrine ROSMorbid obesity  Renal/GU negative Renal ROS  negative genitourinary   Musculoskeletal negative musculoskeletal ROS (+)   Abdominal (+) + obese,   Peds negative pediatric ROS (+)  Hematology negative hematology ROS (+)   Anesthesia Other Findings   Reproductive/Obstetrics negative OB ROS                         Anesthesia Physical Anesthesia Plan  ASA: III  Anesthesia Plan: General   Post-op Pain Management:    Induction: Intravenous  Airway Management Planned: Oral ETT  Additional Equipment:   Intra-op Plan:   Post-operative Plan: Extubation in OR  Informed Consent: I have reviewed the patients History and Physical, chart, labs and discussed the procedure including the risks, benefits and alternatives for the proposed anesthesia with the patient or authorized representative who has indicated his/her understanding and acceptance.   Dental advisory given  Plan Discussed with: CRNA  Anesthesia Plan Comments: (Last plavix was 08-24-13. Plan general.)        Anesthesia Quick  Evaluation

## 2013-08-29 NOTE — Interval H&P Note (Signed)
History and Physical Interval Note:  08/29/2013 8:29 AM  Deborah Elliott  has presented today for surgery, with the diagnosis of OA OF RIGHT KNEE  The various methods of treatment have been discussed with the patient and family. After consideration of risks, benefits and other options for treatment, the patient has consented to  Procedure(s): RIGHT TOTAL KNEE ARTHROPLASTY (Right) as a surgical intervention .  The patient's history has been reviewed, patient examined, no change in status, stable for surgery.  I have reviewed the patient's chart and labs.  Questions were answered to the patient's satisfaction.     Loanne Drilling

## 2013-08-29 NOTE — H&P (View-Only) (Signed)
Deborah Elliott  DOB: 09/07/1946 Married / Language: English / Race: White Female  Date of Admission:  08/29/2013  Chief Complaint:  Right Knee Pain  History of Present Illness The patient is a 68 year old female who comes in for a preoperative History and Physical. The patient is scheduled for a right total knee arthroplasty to be performed by Dr. Gus Rankin. Aluisio, MD at [facility] on 08/29/2013. The patient is a 67 year old female who presents with knee complaints. The patient reports right knee symptoms including: pain (anterior and lateral), swelling and giving way which began year(s) ago without any known injury. Symptoms are exacerbated by weight bearing and walking. Symptoms are relieved by ice. Note for "Knee pain": Patient states that she saw Dr.Aluisio 33yr ago and received cortisone injections. She said that the injection did not help. She is currently taking tylenol for pain. Her knee hurts constantly, but gets worse with weightbearing. Knee has been hurtin for a while now. She went onto the cane last year. It has been getting progressively worse. some swelling. Popping and buckling a lot. Some catching locking. Very stiff in the morning and after sitting a while. She has been seen several years ago. Tylenol helps some and she uses ice with some benefit. She is now ready to proced with knee repalcement. They have been treated conservatively in the past for the above stated problem and despite conservative measures, they continue to have progressive pain and severe functional limitations and dysfunction. They have failed non-operative management including home exercise, medications, and injections. It is felt that they would benefit from undergoing total joint replacement. Risks and benefits of the procedure have been discussed with the patient and they elect to proceed with surgery. There are no active contraindications to surgery such as ongoing infection or  rapidly progressive neurological disease.   Problem List Primary osteoarthritis of one knee (715.16)   Allergies SULFA   Family History Diabetes Mellitus. mother and grandfather mothers side Hypertension. mother Cerebrovascular Accident. mother Congestive Heart Failure. First Degree Relatives. mother Cancer. grandmother mothers side   Social History Alcohol use. never consumed alcohol Exercise. Exercises rarely; does other Drug/Alcohol Rehab (Previously). no Children. 2 Tobacco / smoke exposure. no Pain Contract. no Current work status. retired Financial planner (Currently). no Illicit drug use. no Tobacco use. Former smoker. former smoker; smoke(d) 1/2 pack(s) per day Marital status. married Living situation. live with spouse Post-Surgical Plans. Plan is to go home. Advance Directives. Living Will, Healthcare POA   Medication History Plavix ( Oral) Specific dose unknown - Active. Toprol XL (50MG  Tablet ER 24HR, Oral) Active. Monopril (20MG  Tablet, Oral) Active. Lasix (40MG  Tablet, Oral) Active. Crestor (20MG  Tablet, Oral) Active. Osteo Bi-Flex Adv Double St ( Oral) Specific dose unknown - Active. Caltrate 600+D (600-800MG -UNIT Tablet, Oral) Active. Prevacid (15MG  Capsule DR, Oral) Active. ZyrTEC Allergy (10MG  Capsule, Oral) Active. Tylenol (325MG  Tablet, Oral) Active.   Past Surgical History Heart Stents. 3 Colon Polyp Removal - Colonoscopy Appendectomy Excision of Skin Cancer   Medical History Ruptured, tendon, Achilles (845.09) Pain, Joint, Ankle/Foot (719.47) Leg strain (844.9) Cardiac Arrhythmia Sleep Apnea. uses CPAP High blood pressure Coronary Artery Disease/Heart Disease Hypercholesterolemia Skin Cancer Vertigo Bronchitis Gastroesophageal Reflux Disease Hemorrhoids Urinary Tract Infection Measles Mumps   Review of Systems General:Not Present- Chills, Fever, Night Sweats, Fatigue, Weight Gain,  Weight Loss and Memory Loss. Skin:Not Present- Hives, Itching, Rash, Eczema and Lesions. HEENT:Not Present- Tinnitus, Headache, Double Vision, Visual Loss, Hearing Loss and  Dentures. Respiratory:Not Present- Shortness of breath with exertion, Shortness of breath at rest, Allergies, Coughing up blood and Chronic Cough. Cardiovascular:Not Present- Chest Pain, Racing/skipping heartbeats, Difficulty Breathing Lying Down, Murmur, Swelling and Palpitations. Gastrointestinal:Not Present- Bloody Stool, Heartburn, Abdominal Pain, Vomiting, Nausea, Constipation, Diarrhea, Difficulty Swallowing, Jaundice and Loss of appetitie. Female Genitourinary:Not Present- Blood in Urine, Urinary frequency, Weak urinary stream, Discharge, Flank Pain, Incontinence, Painful Urination, Urgency, Urinary Retention and Urinating at Night. Musculoskeletal:Not Present- Muscle Weakness, Muscle Pain, Joint Swelling, Joint Pain, Back Pain, Morning Stiffness and Spasms. Neurological:Not Present- Tremor, Dizziness, Blackout spells, Paralysis, Difficulty with balance and Weakness. Psychiatric:Not Present- Insomnia.   Vitals Pulse: 60 (Regular) Resp.: 16 (Unlabored) BP: 158/88 (Sitting, Left Arm, Standard)   Physical Exam The physical exam findings are as follows:  Note: Patient is a 67 year old feamle with continued knee pain. Patient is accompanied today by her husband [name].   General Mental Status - Alert, cooperative and good historian. General Appearance- pleasant. Not in acute distress. Orientation- Oriented X3. Build & Nutrition- Well nourished and Well developed.   Head and Neck Head- normocephalic, atraumatic . Neck Global Assessment- supple. no bruit auscultated on the right and no bruit auscultated on the left.   Eye Vision- Wears corrective lenses. Pupil- Bilateral- Regular and Round. Motion- Bilateral- EOMI.   Chest and Lung Exam Auscultation: Breath sounds:-  clear at anterior chest wall and - clear at posterior chest wall. Adventitious sounds:- No Adventitious sounds.   Cardiovascular Auscultation:Rhythm- Regular rate and rhythm. Heart Sounds- S1 WNL and S2 WNL. Murmurs & Other Heart Sounds:Auscultation of the heart reveals - No Murmurs.   Abdomen Palpation/Percussion:Tenderness- Abdomen is non-tender to palpation. Rigidity (guarding)- Abdomen is soft. Auscultation:Auscultation of the abdomen reveals - Bowel sounds normal.   Female Genitourinary Not done, not pertinent to present illness  Musculoskeletal Well developed female in no distress. Right knee shows no effusion. She has a varus deformity. Range of motion is 5 to 125. Tenderness medial greater than lateral. No instability noted.  RADIOGRAPHS: AP both knees and lateral of the right show bone on bone arthritis in the right knee medial and patellofemoral compartments. The left knee is unremarkable.   Assessment & Plan Primary osteoarthritis of one knee (715.16) Impression: Right Knee  Note: Plan is for a Right Total Knee Replacement by Dr. Lequita Halt.  Plan is to go home.  PCP - Dr. Jacky Kindle Cardiology - Dr. Allyson Sabal - Patient has been seen preoperatively and felt to be stable for surgery. The patient was instructed to stop her Plavix 5 days prior to surgery.  The patient will not receive TXA (tranexamic acid) due to: Coronary Heart Disease  Signed electronically by Lauraine Rinne, III PA-C

## 2013-08-29 NOTE — Progress Notes (Signed)
Utilization review completed.  

## 2013-08-29 NOTE — Op Note (Signed)
Pre-operative diagnosis- Osteoarthritis  Right knee(s)  Post-operative diagnosis- Osteoarthritis Right knee(s)  Procedure-  Right  Total Knee Arthroplasty  Surgeon- Gus Rankin. Sanyiah Kanzler, MD  Assistant- Avel Peace, PA-C   Anesthesia-  General EBL-* No blood loss amount entered *  Drains Hemovac  Tourniquet time-  Total Tourniquet Time Documented: Thigh (Right) - 30 minutes Total: Thigh (Right) - 30 minutes    Complications- None  Condition-PACU - hemodynamically stable.   Brief Clinical Note  Deborah Elliott is a 67 y.o. year old female with end stage OA of her right knee with progressively worsening pain and dysfunction. She has constant pain, with activity and at rest and significant functional deficits with difficulties even with ADLs. She has had extensive non-op management including analgesics, injections of cortisone and viscosupplements, and home exercise program, but remains in significant pain with significant dysfunction.Radiographs show bone on bone arthritis medial and patellofemoral. She presents now for right Total Knee Arthroplasty.    Procedure in detail---   The patient is brought into the operating room and positioned supine on the operating table. After successful administration of  General,   a tourniquet is placed high on the  Right thigh(s) and the lower extremity is prepped and draped in the usual sterile fashion. Time out is performed by the operating team and then the  Right lower extremity is wrapped in Esmarch, knee flexed and the tourniquet inflated to 300 mmHg.       A midline incision is made with a ten blade through the subcutaneous tissue to the level of the extensor mechanism. A fresh blade is used to make a medial parapatellar arthrotomy. Soft tissue over the proximal medial tibia is subperiosteally elevated to the joint line with a knife and into the semimembranosus bursa with a Cobb elevator. Soft tissue over the proximal lateral tibia is elevated with  attention being paid to avoiding the patellar tendon on the tibial tubercle. The patella is everted, knee flexed 90 degrees and the ACL and PCL are removed. Findings are bone on bone medial and patellofemoral with large medial osteophytes.        The drill is used to create a starting hole in the distal femur and the canal is thoroughly irrigated with sterile saline to remove the fatty contents. The 5 degree Right  valgus alignment guide is placed into the femoral canal and the distal femoral cutting block is pinned to remove 10 mm off the distal femur. Resection is made with an oscillating saw.      The tibia is subluxed forward and the menisci are removed. The extramedullary alignment guide is placed referencing proximally at the medial aspect of the tibial tubercle and distally along the second metatarsal axis and tibial crest. The block is pinned to remove 2mm off the more deficient medial  side. Resection is made with an oscillating saw. Size 3is the most appropriate size for the tibia and the proximal tibia is prepared with the modular drill and keel punch for that size.      The femoral sizing guide is placed and size 3 is most appropriate. Rotation is marked off the epicondylar axis and confirmed by creating a rectangular flexion gap at 90 degrees. The size 3 cutting block is pinned in this rotation and the anterior, posterior and chamfer cuts are made with the oscillating saw. The intercondylar block is then placed and that cut is made.      Trial size 3 tibial component, trial size 3 posterior  stabilized femur and a 12.5  mm posterior stabilized rotating platform insert trial is placed. Full extension is achieved with excellent varus/valgus and anterior/posterior balance throughout full range of motion. The patella is everted and thickness measured to be 22  mm. Free hand resection is taken to 12 mm, a 35 template is placed, lug holes are drilled, trial patella is placed, and it tracks normally.  Osteophytes are removed off the posterior femur with the trial in place. All trials are removed and the cut bone surfaces prepared with pulsatile lavage. Cement is mixed and once ready for implantation, the size 3 tibial implant, size  3 posterior stabilized femoral component, and the size 35 patella are cemented in place and the patella is held with the clamp. The trial insert is placed and the knee held in full extension. The Exparel (20 ml mixed with 30 ml saline) and .25% Bupivicaine, are injected into the extensor mechanism, posterior capsule, medial and lateral gutters and subcutaneous tissues.  All extruded cement is removed and once the cement is hard the permanent 12.5 mm posterior stabilized rotating platform insert is placed into the tibial tray.      The wound is copiously irrigated with saline solution and the extensor mechanism closed over a hemovac drain with #1 PDS suture. The tourniquet is released for a total tourniquet time of 30  minutes. Flexion against gravity is 140 degrees and the patella tracks normally. Subcutaneous tissue is closed with 2.0 vicryl and subcuticular with running 4.0 Monocryl. The incision is cleaned and dried and steri-strips and a bulky sterile dressing are applied. The limb is placed into a knee immobilizer and the patient is awakened and transported to recovery in stable condition.      Please note that a surgical assistant was a medical necessity for this procedure in order to perform it in a safe and expeditious manner. Surgical assistant was necessary to retract the ligaments and vital neurovascular structures to prevent injury to them and also necessary for proper positioning of the limb to allow for anatomic placement of the prosthesis.   Gus Rankin Shatika Grinnell, MD    08/29/2013, 11:25 AM

## 2013-08-29 NOTE — Preoperative (Signed)
Beta Blockers   Reason not to administer Beta Blockers:Not Applicable 

## 2013-08-29 NOTE — Anesthesia Procedure Notes (Signed)
Procedure Name: Intubation Date/Time: 08/29/2013 10:30 AM Performed by: Leroy Libman L Patient Re-evaluated:Patient Re-evaluated prior to inductionOxygen Delivery Method: Circle system utilized Preoxygenation: Pre-oxygenation with 100% oxygen Intubation Type: IV induction Ventilation: Mask ventilation without difficulty and Oral airway inserted - appropriate to patient size Laryngoscope Size: Hyacinth Meeker and 2 Grade View: Grade I Tube type: Oral Tube size: 7.5 mm Number of attempts: 1 Airway Equipment and Method: Stylet Placement Confirmation: ETT inserted through vocal cords under direct vision,  breath sounds checked- equal and bilateral and positive ETCO2 Secured at: 20 cm Tube secured with: Tape Dental Injury: Teeth and Oropharynx as per pre-operative assessment

## 2013-08-29 NOTE — Anesthesia Postprocedure Evaluation (Signed)
  Anesthesia Post-op Note  Patient: Deborah Elliott  Procedure(s) Performed: Procedure(s) (LRB): RIGHT TOTAL KNEE ARTHROPLASTY (Right)  Patient Location: PACU  Anesthesia Type: General  Level of Consciousness: awake and alert   Airway and Oxygen Therapy: Patient Spontanous Breathing  Post-op Pain: mild  Post-op Assessment: Post-op Vital signs reviewed, Patient's Cardiovascular Status Stable, Respiratory Function Stable, Patent Airway and No signs of Nausea or vomiting  Last Vitals:  Filed Vitals:   08/29/13 1304  BP: 168/84  Pulse: 65  Temp: 36.9 C  Resp: 16    Post-op Vital Signs: stable   Complications: No apparent anesthesia complications

## 2013-08-29 NOTE — Transfer of Care (Signed)
Immediate Anesthesia Transfer of Care Note  Patient: Deborah Elliott  Procedure(s) Performed: Procedure(s): RIGHT TOTAL KNEE ARTHROPLASTY (Right)  Patient Location: PACU  Anesthesia Type:General  Level of Consciousness: sedated  Airway & Oxygen Therapy: Patient Spontanous Breathing and Patient connected to face mask oxygen  Post-op Assessment: Report given to PACU RN and Post -op Vital signs reviewed and stable  Post vital signs: Reviewed and stable  Complications: No apparent anesthesia complications

## 2013-08-30 DIAGNOSIS — D62 Acute posthemorrhagic anemia: Secondary | ICD-10-CM | POA: Diagnosis not present

## 2013-08-30 LAB — CBC
HCT: 29.3 % — ABNORMAL LOW (ref 36.0–46.0)
Hemoglobin: 10.1 g/dL — ABNORMAL LOW (ref 12.0–15.0)
MCH: 30.4 pg (ref 26.0–34.0)
MCHC: 34.5 g/dL (ref 30.0–36.0)
MCV: 88.3 fL (ref 78.0–100.0)
Platelets: 155 10*3/uL (ref 150–400)
RBC: 3.32 MIL/uL — ABNORMAL LOW (ref 3.87–5.11)
RDW: 13.5 % (ref 11.5–15.5)
WBC: 9.3 10*3/uL (ref 4.0–10.5)

## 2013-08-30 LAB — BASIC METABOLIC PANEL
BUN: 10 mg/dL (ref 6–23)
CO2: 22 mEq/L (ref 19–32)
Calcium: 8.7 mg/dL (ref 8.4–10.5)
Chloride: 103 mEq/L (ref 96–112)
Creatinine, Ser: 0.63 mg/dL (ref 0.50–1.10)
GFR calc Af Amer: >90 mL/min (ref >90)
GFR calc non Af Amer: >90 mL/min (ref >90)
Glucose, Bld: 186 mg/dL — ABNORMAL HIGH (ref 70–99)
Potassium: 4.2 mEq/L (ref 3.5–5.1)
Sodium: 135 mEq/L (ref 135–145)

## 2013-08-30 MED ORDER — LANSOPRAZOLE 15 MG PO CPDR
15.0000 mg | DELAYED_RELEASE_CAPSULE | Freq: Every day | ORAL | Status: DC
  Administered 2013-08-30 – 2013-09-02 (×4): 15 mg via ORAL
  Filled 2013-08-30 (×4): qty 1

## 2013-08-30 MED ORDER — NON FORMULARY: 15.0000 mg | Freq: Every day | Status: DC

## 2013-08-30 NOTE — Progress Notes (Addendum)
   Subjective: 1 Day Post-Op Procedure(s) (LRB): RIGHT TOTAL KNEE ARTHROPLASTY (Right) Patient reports pain as mild.   Patient seen in rounds with Dr. Lequita Halt. Patient is well, and has had no acute complaints or problems We will start therapy today.  Plan is to go Home after hospital stay.  Objective: Vital signs in last 24 hours: Temp:  [97.5 F (36.4 C)-99 F (37.2 C)] 98.6 F (37 C) (11/11 0629) Pulse Rate:  [61-80] 68 (11/11 0629) Resp:  [10-18] 18 (11/11 0755) BP: (127-185)/(61-87) 135/81 mmHg (11/11 0629) SpO2:  [92 %-100 %] 92 % (11/11 0629) Weight:  [108.58 kg (239 lb 6 oz)] 108.58 kg (239 lb 6 oz) (11/10 1330)  Intake/Output from previous day:  Intake/Output Summary (Last 24 hours) at 08/30/13 0801 Last data filed at 08/30/13 0700  Gross per 24 hour  Intake 3911.75 ml  Output   3278 ml  Net 633.75 ml    Intake/Output this shift:    Labs:  Recent Labs  08/30/13 0433  HGB 10.1*    Recent Labs  08/30/13 0433  WBC 9.3  RBC 3.32*  HCT 29.3*  PLT 155    Recent Labs  08/30/13 0433  NA 135  K 4.2  CL 103  CO2 22  BUN 10  CREATININE 0.63  GLUCOSE 186*  CALCIUM 8.7   No results found for this basename: LABPT, INR,  in the last 72 hours  EXAM General - Patient is Alert, Appropriate and Oriented Extremity - Neurovascular intact Sensation intact distally Dorsiflexion/Plantar flexion intact Dressing - dressing C/D/I Motor Function - intact, moving foot and toes well on exam.  Hemovac pulled without difficulty.  Past Medical History  Diagnosis Date  . Hypertension   . Arthritis of knee   . Coronary artery disease     X3 STENTS  . Dysrhythmia     HX of tachycardia - controlled with med  . Leg cramps   . Hx of skin cancer, basal cell   . GERD (gastroesophageal reflux disease)   . Sleep apnea     uses c-pap   . Cancer     skin cancer    Assessment/Plan: 1 Day Post-Op Procedure(s) (LRB): RIGHT TOTAL KNEE ARTHROPLASTY  (Right) Principal Problem:   OA (osteoarthritis) of knee Active Problems:   Postoperative anemia due to acute blood loss  Estimated body mass index is 41.07 kg/(m^2) as calculated from the following:   Height as of this encounter: 5\' 4"  (1.626 m).   Weight as of this encounter: 108.58 kg (239 lb 6 oz). Advance diet Up with therapy Plan for discharge tomorrow Discharge home with home health  DVT Prophylaxis - Xarelto, Plavix 75 mg on hold for three weeks and then resume when finishes the Xarelto. Weight-Bearing as tolerated to right leg D/C O2 and Pulse OX and try on Room Air  Deborah Elliott 08/30/2013, 8:01 AM

## 2013-08-30 NOTE — Progress Notes (Signed)
Inpatient Diabetes Program Recommendations  AACE/ADA: New Consensus Statement on Inpatient Glycemic Control (2013)  Target Ranges:  Prepandial:   less than 140 mg/dL      Peak postprandial:   less than 180 mg/dL (1-2 hours)      Critically ill patients:  140 - 180 mg/dL   Reason for Assessment: Elevated glucose above target  Results for Deborah Elliott, Deborah Elliott (MRN [id number]) as of 08/30/2013 10:30  Ref. Range 08/30/2013 04:33  Glucose Latest Range: 70-99 mg/dL 409 (H)   Presumably patient's response to stress of surgery and use of steroids triggered an increase in glucose above target. Patient has the following risk factors for diabetes:  Overweight, Family History of DM, CAD, Hypertension, Hgb A1C of 6.4 in May of 2011, which could place her at risk for diabetes.   Request MD alert patient's PCP (through Discharge Summary or any other appropriate method) so appropriate follow-up regarding potential for pre-diabetes or diabetes can be pursued. Thank you. Anderson Coppock S. Elsie Lincoln, RN, CNS, CDE Inpatient Diabetes Program, team pager [phone]

## 2013-08-30 NOTE — Evaluation (Signed)
Occupational Therapy Evaluation and Discharge Summary Patient Details Name: Deborah Elliott MRN: [id number] DOB: 02/26/1946 Today's Date: 08/30/2013 Time: 7829-5621 OT Time Calculation (min): 17 min  OT Assessment / Plan / Recommendation History of present illness rtka   Clinical Impression   Pt admitted with above diagnosis and is doing fairly well with adls.  Pt only needs assist with LE dressing and reaching R foot at this time.  Husband will be home to assist.  All adl education complete at this time.    OT Assessment  Patient does not need any further OT services    Follow Up Recommendations  No OT follow up;Supervision/Assistance - 24 hour    Barriers to Discharge      Equipment Recommendations  None recommended by OT    Recommendations for Other Services    Frequency       Precautions / Restrictions Precautions Precautions: Knee Required Braces or Orthoses: Knee Immobilizer - Right Knee Immobilizer - Right: Discontinue once straight leg raise with < 10 degree lag Restrictions Weight Bearing Restrictions: No   Pertinent Vitals/Pain Pt with pain of 8/10.  Nursing notified.    ADL  Eating/Feeding: Performed;Independent Where Assessed - Eating/Feeding: Chair Grooming: Performed;Wash/dry hands;Wash/dry face;Supervision/safety Where Assessed - Grooming: Supported standing Upper Body Bathing: Simulated;Set up Where Assessed - Upper Body Bathing: Unsupported sitting Lower Body Bathing: Simulated;Minimal assistance Where Assessed - Lower Body Bathing: Unsupported sit to stand Upper Body Dressing: Performed;Set up Where Assessed - Upper Body Dressing: Unsupported sitting Lower Body Dressing: Performed;Moderate assistance Where Assessed - Lower Body Dressing: Unsupported sit to stand Toilet Transfer: Performed;Min guard Toilet Transfer Method: Sit to stand;Other (comment) (walked to br) Toilet Transfer Equipment: Comfort height toilet;Grab bars Toileting - Clothing  Manipulation and Hygiene: Performed;Supervision/safety Where Assessed - Toileting Clothing Manipulation and Hygiene: Sit to stand from 3-in-1 or toilet Tub/Shower Transfer: Simulated;Minimal assistance Tub/Shower Transfer Method: Ambulating Tub/Shower Transfer Equipment: Counsellor Used: Knee Immobilizer;Rolling walker Transfers/Ambulation Related to ADLs: Pt walked to br and back with close S.  Pt transferred sit to stand to sit with min guard to start then S. ADL Comments: Pt only needs assist with LE adls due to inability to reach R foot. Husbabnd is at home to assist.    OT Diagnosis:    OT Problem List:   OT Treatment Interventions:     OT Goals(Current goals can be found in the care plan section) Acute Rehab OT Goals Patient Stated Goal: I want to walk, not fall  Visit Information  Last OT Received On: 08/30/13 Assistance Needed: +1 History of Present Illness: rtka       Prior Functioning     Home Living Family/patient expects to be discharged to:: Private residence Living Arrangements: Spouse/significant other Available Help at Discharge: Family Type of Home: House Home Access: Stairs to enter Secretary/administrator of Steps: 1/2 Home Layout: Multi-level Alternate Level Stairs-Number of Steps: 1/2 between kitchen/den Home Equipment: Cane - single point;Walker - 2 wheels;Bedside commode;Tub bench Prior Function Level of Independence: Independent with assistive device(s) Communication Communication: No difficulties Dominant Hand: Right         Vision/Perception Vision - History Baseline Vision: No visual deficits Patient Visual Report: No change from baseline Vision - Assessment Eye Alignment: Within Functional Limits Vision Assessment: Vision not tested   Cognition  Cognition Arousal/Alertness: Awake/alert Behavior During Therapy: Impulsive;WFL for tasks assessed/performed Overall Cognitive Status: Within Functional Limits for tasks  assessed    Extremity/Trunk Assessment Upper Extremity Assessment Upper  Extremity Assessment: Overall WFL for tasks assessed Lower Extremity Assessment Lower Extremity Assessment: Defer to PT evaluation RLE Deficits / Details: slr WITH A LAG. Cervical / Trunk Assessment Cervical / Trunk Assessment: Normal     Mobility Bed Mobility Bed Mobility: Not assessed Supine to Sit: 4: Min assist;With rails;HOB elevated Details for Bed Mobility Assistance: pt in chair on arrival. Transfers Transfers: Sit to Stand;Stand to Sit Sit to Stand: 4: Min guard;With armrests;From chair/3-in-1 Stand to Sit: 5: Supervision;To chair/3-in-1;With armrests Details for Transfer Assistance: Cues for hand position adn cues to take her time.     Exercise Total Joint Exercises Ankle Circles/Pumps: AROM;Both;10 reps Heel Slides: AAROM;Right;5 reps;Supine Straight Leg Raises: AAROM;Right;5 reps;Supine   Balance Balance Balance Assessed: Yes Static Standing Balance Static Standing - Balance Support: Bilateral upper extremity supported;During functional activity Static Standing - Level of Assistance: 5: Stand by assistance Static Standing - Comment/# of Minutes: 3 a t sink   End of Session OT - End of Session Equipment Utilized During Treatment: Rolling walker;Right knee immobilizer Activity Tolerance: Patient tolerated treatment well Patient left: in chair;with call bell/phone within reach Nurse Communication: Mobility status CPM Right Knee CPM Right Knee: Off  GO     Hope Budds 08/30/2013, 9:48 AM [phone]

## 2013-08-30 NOTE — Evaluation (Signed)
Physical Therapy Evaluation Patient Details Name: Deborah Elliott MRN: [id number] DOB: 08-19-1946 Today's Date: 08/30/2013 Time: 4098-1191 PT Time Calculation (min): 29 min  PT Assessment / Plan / Recommendation History of Present Illness  rtka  Clinical Impression  Pt ambulated x 25 ' with RW. Pt with dyspnea, decreased endurance. Pt plans DC home. Pt will benefit from PT to address problems listed below.    PT Assessment  Patient needs continued PT services    Follow Up Recommendations  Home health PT    Does the patient have the potential to tolerate intense rehabilitation      Barriers to Discharge        Equipment Recommendations  None recommended by PT    Recommendations for Other Services     Frequency 7X/week    Precautions / Restrictions Precautions Precautions: Knee Required Braces or Orthoses: Knee Immobilizer - Right Knee Immobilizer - Right: Discontinue once straight leg raise with < 10 degree lag Restrictions Weight Bearing Restrictions: No   Pertinent Vitals/Pain HR 66 Sats 94 RA.       Mobility  Bed Mobility Bed Mobility: Supine to Sit Supine to Sit: 4: Min assist;With rails;HOB elevated Details for Bed Mobility Assistance: CUES FOR TECHNIQUE, SLOW DOWN TO CONTROL leg, support  RLE to floor. Transfers Transfers: Sit to Stand;Stand to Sit Sit to Stand: 4: Min assist;From bed;With upper extremity assist Stand to Sit: To chair/3-in-1;With upper extremity assist;4: Min assist Details for Transfer Assistance: cues for RLE position and hand position, safe use of RW. Ambulation/Gait Ambulation/Gait Assistance: 4: Min assist Ambulation Distance (Feet): 25 Feet Assistive device: Rolling walker Ambulation/Gait Assistance Details: cues for safe use of RW, sequence and step length on R Gait Pattern: Step-to pattern;Antalgic;Decreased step length - right;Decreased stance time - right    Exercises Total Joint Exercises Ankle Circles/Pumps: AROM;Both;10  reps Heel Slides: AAROM;Right;5 reps;Supine Straight Leg Raises: AAROM;Right;5 reps;Supine   PT Diagnosis: Acute pain  PT Problem List: Decreased strength;Decreased range of motion;Decreased activity tolerance;Decreased mobility;Decreased knowledge of precautions;Decreased safety awareness;Decreased knowledge of use of DME;Pain PT Treatment Interventions: DME instruction;Gait training;Stair training;Functional mobility training;Therapeutic activities;Therapeutic exercise;Patient/family education     PT Goals(Current goals can be found in the care plan section) Acute Rehab PT Goals Patient Stated Goal: I want to walk, not fall PT Goal Formulation: With patient Time For Goal Achievement: 09/06/13 Potential to Achieve Goals: Good  Visit Information  Last PT Received On: 08/30/13 Assistance Needed: +1 History of Present Illness: rtka       Prior Functioning  Home Living Family/patient expects to be discharged to:: Private residence Living Arrangements: Spouse/significant other Available Help at Discharge: Family Type of Home: House Home Access: Stairs to enter Secretary/administrator of Steps: 1/2 Home Layout: Multi-level Alternate Level Stairs-Number of Steps: 1/2 between kitchen/den Home Equipment: Cane - single point;Walker - 2 wheels;Bedside commode Prior Function Level of Independence: Independent with assistive device(s) Communication Communication: No difficulties    Cognition  Cognition Arousal/Alertness: Awake/alert Behavior During Therapy: Impulsive;WFL for tasks assessed/performed Overall Cognitive Status: Within Functional Limits for tasks assessed    Extremity/Trunk Assessment Upper Extremity Assessment Upper Extremity Assessment: Defer to OT evaluation Lower Extremity Assessment Lower Extremity Assessment: RLE deficits/detail RLE Deficits / Details: slr WITH A LAG.   Balance    End of Session PT - End of Session Equipment Utilized During Treatment: Right  knee immobilizer Activity Tolerance: Patient limited by pain;Patient limited by fatigue Patient left: in chair Nurse Communication: Mobility status;Patient requests pain meds  CPM Right Knee CPM Right Knee: Off  GP     Rada Hay 08/30/2013, 9:31 AM Blanchard Kelch PT [phone]

## 2013-08-30 NOTE — Care Management Note (Signed)
  Page 1 of 1   08/30/2013     5:45:26 PM   CARE MANAGEMENT NOTE 08/30/2013  Patient:  Deborah Elliott, Deborah Elliott   Account Number:  [id number]  Date Initiated:  08/30/2013  Documentation initiated by:  Colleen Can  Subjective/Objective Assessment:   dx total rt knee replacment     Action/Plan:   CM spke with patient. Plans are for her to return to her home in [facility] where spouse will be caregiver. She already has RW, commode chair, and transfer bench.   Anticipated DC Date:  08/31/2013   Anticipated DC Plan:  HOME W HOME HEALTH SERVICES      DC Planning Services  CM consult      [facility] Choice  HOME HEALTH   Choice offered to / List presented to:  C-1 Patient        HH arranged  HH-2 PT      [facility] agency  [facility]   Status of service:  In process, will continue to follow Medicare Important Message given?   (If response is "NO", the following Medicare IM given date fields will be blank) Date Medicare IM given:   Date Additional Medicare IM given:    Discharge Disposition:    Per UR Regulation:    If discussed at Long Length of Stay Meetings, dates discussed:    Comments:

## 2013-08-30 NOTE — Progress Notes (Signed)
Physical Therapy Treatment Patient Details Name: ALAISA Elliott MRN: [id number] DOB: 11-Nov-1945 Today's Date: 08/30/2013 Time: 6440-3474 PT Time Calculation (min): 19 min  PT Assessment / Plan / Recommendation  History of Present Illness rtka   PT Comments   Pt experiencing increased pain. Limited exercises at this time. RN brought IV meds.  Follow Up Recommendations  Home health PT     Does the patient have the potential to tolerate intense rehabilitation     Barriers to Discharge        Equipment Recommendations  None recommended by PT    Recommendations for Other Services    Frequency 7X/week   Progress towards PT Goals Progress towards PT goals: Progressing toward goals  Plan Current plan remains appropriate    Precautions / Restrictions Precautions Precautions: Knee;Fall   Pertinent Vitals/Pain 9 R knee    Mobility  Bed Mobility Bed Mobility: Sit to Supine Sit to Supine: 4: Min guard Details for Bed Mobility Assistance: support  of R leg onto bed. Transfers Stand to Sit: To bed;With upper extremity assist;4: Min guard Details for Transfer Assistance: Cues for hand position adn cues to take her time. Ambulation/Gait Ambulation/Gait Assistance: 4: Min assist Ambulation Distance (Feet): 10 Feet Assistive device: Rolling walker Ambulation/Gait Assistance Details: limited by pain in R knee. Gait Pattern: Step-to pattern;Antalgic;Decreased step length - right;Decreased stance time - right    Exercises Total Joint Exercises Quad Sets: AROM;Right;10 reps;Supine Heel Slides: AAROM;Right;5 reps   PT Diagnosis:    PT Problem List:   PT Treatment Interventions:     PT Goals (current goals can now be found in the care plan section)    Visit Information  Last PT Received On: 08/30/13 Assistance Needed: +1 History of Present Illness: rtka    Subjective Data      Cognition  Cognition Behavior During Therapy: Anxious    Balance     End of Session PT - End  of Session Equipment Utilized During Treatment: Right knee immobilizer Activity Tolerance: Patient limited by pain;Patient limited by fatigue Patient left: in bed;with call bell/phone within reach;with family/visitor present Nurse Communication: Mobility status;Patient requests pain meds CPM Right Knee CPM Right Knee: On   GP     Rada Hay 08/30/2013, 4:41 PM Blanchard Kelch PT [phone]

## 2013-08-31 LAB — BASIC METABOLIC PANEL
BUN: 11 mg/dL (ref 6–23)
CO2: 23 mEq/L (ref 19–32)
Calcium: 9.1 mg/dL (ref 8.4–10.5)
Chloride: 102 mEq/L (ref 96–112)
Creatinine, Ser: 0.59 mg/dL (ref 0.50–1.10)
GFR calc Af Amer: >90 mL/min (ref >90)
GFR calc non Af Amer: >90 mL/min (ref >90)
Glucose, Bld: 156 mg/dL — ABNORMAL HIGH (ref 70–99)
Potassium: 4.3 mEq/L (ref 3.5–5.1)
Sodium: 136 mEq/L (ref 135–145)

## 2013-08-31 LAB — CBC
HCT: 28.6 % — ABNORMAL LOW (ref 36.0–46.0)
Hemoglobin: 9.8 g/dL — ABNORMAL LOW (ref 12.0–15.0)
MCH: 30.4 pg (ref 26.0–34.0)
MCHC: 34.3 g/dL (ref 30.0–36.0)
MCV: 88.8 fL (ref 78.0–100.0)
Platelets: 173 10*3/uL (ref 150–400)
RBC: 3.22 MIL/uL — ABNORMAL LOW (ref 3.87–5.11)
RDW: 13.6 % (ref 11.5–15.5)
WBC: 13.7 10*3/uL — ABNORMAL HIGH (ref 4.0–10.5)

## 2013-08-31 NOTE — Progress Notes (Signed)
Pt has stated that she does not require help from RT tonight with CPAP.  Pt notified to call if any problems.  RT to monitor and assess as needed.

## 2013-08-31 NOTE — Progress Notes (Signed)
   Subjective: 2 Days Post-Op Procedure(s) (LRB): RIGHT TOTAL KNEE ARTHROPLASTY (Right) Patient reports pain as mild and moderate.   Patient seen in rounds for Dr. Lequita Halt. Patient is well, but has had some minor complaints of pain in the knee, requiring pain medications Plan is to go Home after hospital stay.  Objective: Vital signs in last 24 hours: Temp:  [97.7 F (36.5 C)-98 F (36.7 C)] 97.9 F (36.6 C) (11/12 1417) Pulse Rate:  [64-74] 64 (11/12 1417) Resp:  [16-18] 16 (11/12 1417) BP: (167-186)/(74-82) 178/74 mmHg (11/12 1417) SpO2:  [91 %-95 %] 93 % (11/12 1417)  Intake/Output from previous day:  Intake/Output Summary (Last 24 hours) at 08/31/13 1553 Last data filed at 08/31/13 0900  Gross per 24 hour  Intake    884 ml  Output      0 ml  Net    884 ml    Intake/Output this shift: Total I/O In: 240 [P.O.:240] Out: -   Labs:  Recent Labs  08/30/13 0433 08/31/13 0455  HGB 10.1* 9.8*    Recent Labs  08/30/13 0433 08/31/13 0455  WBC 9.3 13.7*  RBC 3.32* 3.22*  HCT 29.3* 28.6*  PLT 155 173    Recent Labs  08/30/13 0433 08/31/13 0455  NA 135 136  K 4.2 4.3  CL 103 102  CO2 22 23  BUN 10 11  CREATININE 0.63 0.59  GLUCOSE 186* 156*  CALCIUM 8.7 9.1   No results found for this basename: LABPT, INR,  in the last 72 hours  EXAM General - Patient is Alert and Appropriate Extremity - Neurovascular intact Sensation intact distally Dorsiflexion/Plantar flexion intact Dressing/Incision - clean, dry, no drainage, healing Motor Function - intact, moving foot and toes well on exam.   Past Medical History  Diagnosis Date  . Hypertension   . Arthritis of knee   . Coronary artery disease     X3 STENTS  . Dysrhythmia     HX of tachycardia - controlled with med  . Leg cramps   . Hx of skin cancer, basal cell   . GERD (gastroesophageal reflux disease)   . Sleep apnea     uses c-pap   . Cancer     skin cancer    Assessment/Plan: 2 Days  Post-Op Procedure(s) (LRB): RIGHT TOTAL KNEE ARTHROPLASTY (Right) Principal Problem:   OA (osteoarthritis) of knee Active Problems:   Postoperative anemia due to acute blood loss  Estimated body mass index is 41.07 kg/(m^2) as calculated from the following:   Height as of this encounter: 5\' 4"  (1.626 m).   Weight as of this encounter: 108.58 kg (239 lb 6 oz). Up with therapy Plan for discharge tomorrow Discharge home with home health  DVT Prophylaxis - Xarelto, Plavix 75 mg on hold for three weeks and then resume when finishes the Xarelto.  Weight-Bearing as tolerated to right leg  Race Latour 08/31/2013, 3:53 PM

## 2013-08-31 NOTE — Progress Notes (Signed)
Physical Therapy Treatment Patient Details Name: LINDELL TUSSEY MRN: [id number] DOB: 1946/01/15 Today's Date: 08/31/2013 Time: 7829-5621 PT Time Calculation (min): 44 min  PT Assessment / Plan / Recommendation  History of Present Illness rtka   PT Comments   Gentle ROM today to R knee. Pt is unsteady during ambulation, tends to lose balance, is impulsive. DC delayed until possibly tomorrow. Pt  Can benefit fro more mobility to improve safety. Spouse present toobserve and instruct in mobility.  Follow Up Recommendations  Home health PT     Does the patient have the potential to tolerate intense rehabilitation     Barriers to Discharge        Equipment Recommendations  None recommended by PT    Recommendations for Other Services    Frequency 7X/week   Progress towards PT Goals Progress towards PT goals: Progressing toward goals  Plan Current plan remains appropriate    Precautions / Restrictions Precautions Precautions: Knee;Fall Precaution Comments: pt has developed blisters under dressing. Required Braces or Orthoses: Knee Immobilizer - Right Knee Immobilizer - Right: Discontinue once straight leg raise with < 10 degree lag   Pertinent Vitals/Pain 4, RN in to give meds.    Mobility  Bed Mobility Sit to Supine: 4: Min assist Details for Bed Mobility Assistance: support  of R leg onto bed. Transfers Sit to Stand: 4: Min guard;With armrests;From chair/3-in-1;With upper extremity assist Stand to Sit: To bed;With upper extremity assist;4: Min guard Details for Transfer Assistance: Cues for hand position and cues to take her time. Ambulation/Gait Ambulation/Gait Assistance: 4: Min assist Ambulation Distance (Feet): 50 Feet Assistive device: Rolling walker Ambulation/Gait Assistance Details: frequent rest breaks. Pt with noted dyspnea. cueas for sequence and position inside of RW. Gait Pattern: Step-to pattern;Antalgic;Decreased step length - right;Decreased stance time -  right    Exercises Total Joint Exercises Ankle Circles/Pumps: AROM;Both;10 reps Quad Sets: AROM;Right;10 reps;Supine Short Arc Quad: AAROM;Right;10 reps;Supine Heel Slides: AAROM;Right;10 reps Straight Leg Raises: AAROM;Right;10 reps;Supine Goniometric ROM: 10-40 R knee   PT Diagnosis:    PT Problem List:   PT Treatment Interventions:     PT Goals (current goals can now be found in the care plan section)    Visit Information  Last PT Received On: 08/31/13 Assistance Needed: +1 History of Present Illness: rtka    Subjective Data      Cognition  Cognition Arousal/Alertness: Awake/alert    Balance     End of Session PT - End of Session Equipment Utilized During Treatment: Right knee immobilizer Activity Tolerance: Patient limited by pain;Patient limited by fatigue Patient left: in bed;with call bell/phone within reach;with family/visitor present Nurse Communication: Mobility status;Patient requests pain meds CPM Right Knee CPM Right Knee:    GP     Rada Hay 08/31/2013, 3:54 PM Blanchard Kelch PT [phone]

## 2013-09-01 LAB — CBC
HCT: 25.3 % — ABNORMAL LOW (ref 36.0–46.0)
Hemoglobin: 8.6 g/dL — ABNORMAL LOW (ref 12.0–15.0)
MCH: 30.3 pg (ref 26.0–34.0)
MCHC: 34 g/dL (ref 30.0–36.0)
MCV: 89.1 fL (ref 78.0–100.0)
Platelets: 120 10*3/uL — ABNORMAL LOW (ref 150–400)
RBC: 2.84 MIL/uL — ABNORMAL LOW (ref 3.87–5.11)
RDW: 13.7 % (ref 11.5–15.5)
WBC: 8.7 10*3/uL (ref 4.0–10.5)

## 2013-09-01 MED ORDER — SODIUM CHLORIDE 0.9 % IV BOLUS (SEPSIS)
500.0000 mL | Freq: Once | INTRAVENOUS | Status: AC
  Administered 2013-09-01: 12:00:00 500 mL via INTRAVENOUS

## 2013-09-01 MED ORDER — HYDROCODONE-ACETAMINOPHEN 5-325 MG PO TABS
1.0000 | ORAL_TABLET | ORAL | Status: DC | PRN
  Administered 2013-09-01: 1 via ORAL
  Administered 2013-09-01: 22:00:00 2 via ORAL
  Administered 2013-09-01 – 2013-09-02 (×2): 1 via ORAL
  Administered 2013-09-02: 2 via ORAL
  Administered 2013-09-02: 13:00:00 1 via ORAL
  Filled 2013-09-01 (×3): qty 1
  Filled 2013-09-01: qty 2
  Filled 2013-09-01: qty 1
  Filled 2013-09-01: qty 2

## 2013-09-01 MED ORDER — TRAMADOL HCL 50 MG PO TABS: 50.0000 mg | ORAL_TABLET | Freq: Four times a day (QID) | ORAL | Status: DC | PRN

## 2013-09-01 MED ORDER — RIVAROXABAN 10 MG PO TABS: 10.0000 mg | ORAL_TABLET | Freq: Every day | ORAL | Status: DC

## 2013-09-01 MED ORDER — HYDROCODONE-ACETAMINOPHEN 5-325 MG PO TABS: 1.0000 | ORAL_TABLET | ORAL | Status: DC | PRN

## 2013-09-01 MED ORDER — METHOCARBAMOL 500 MG PO TABS: 500.0000 mg | ORAL_TABLET | Freq: Four times a day (QID) | ORAL | Status: DC | PRN

## 2013-09-01 NOTE — Progress Notes (Signed)
Physical Therapy Treatment Patient Details Name: Deborah Elliott MRN: [id number] DOB: Jan 30, 1946 Today's Date: 09/01/2013 Time: 4098-1191 PT Time Calculation (min): 35 min  PT Assessment / Plan / Recommendation  History of Present Illness rtka   PT Comments   Pt having more difficulty with ambulation and dizziness, diaphoresis, some decrease in BP after amb x 6 ft. RN aware.  Follow Up Recommendations  Home health PT     Does the patient have the potential to tolerate intense rehabilitation     Barriers to Discharge        Equipment Recommendations  None recommended by PT    Recommendations for Other Services    Frequency 7X/week   Progress towards PT Goals Progress towards PT goals: Progressing toward goals  Plan Current plan remains appropriate    Precautions / Restrictions Precautions Precautions: Knee;Fall Precaution Comments: pt has developed blisters under dressing.orthostatic  Required Braces or Orthoses: Knee Immobilizer - Right   Pertinent Vitals/Pain Sup=136/77 94% 79 Sitting 143./73 Standing 136/87 After 6' walk=106/64 After reclined=134/75 100% 88hr   Mobility  Bed Mobility Sit to Supine: 4: Min assist Details for Bed Mobility Assistance: support  of R leg onto bed. Transfers Sit to Stand: 4: Min guard;From chair/3-in-1;With upper extremity assist Stand to Sit: To bed;With upper extremity assist;4: Min assist;3: Mod assist Details for Transfer Assistance: pt began c/o's of feeling nausea, dizzy, "Like I will pass out". Pt diaphoretic.VS taken and RN summoned. Ambulation/Gait Ambulation/Gait Assistance: 1: +2 Total assist (follow with chair for safety due to previous dizziness with RN) Ambulation/Gait: Patient Percentage: 60% Ambulation Distance (Feet): 6 Feet Assistive device: Rolling walker Ambulation/Gait Assistance Details: pt had much difficulty walking, anxious.    Exercises     PT Diagnosis:    PT Problem List:   PT Treatment Interventions:      PT Goals (current goals can now be found in the care plan section)    Visit Information  Last PT Received On: 09/01/13 Assistance Needed: +2 History of Present Illness: rtka    Subjective Data      Cognition  Cognition Arousal/Alertness: Lethargic Behavior During Therapy: Anxious    Balance     End of Session PT - End of Session Equipment Utilized During Treatment: Right knee immobilizer Activity Tolerance: Treatment limited secondary to medical complications (Comment) (hypotension) Patient left: in bed;with family/visitor present;with call bell/phone within reach Nurse Communication: Mobility status;Other (comment) (BP)   GP     Rada Hay 09/01/2013, 11:21 AM Blanchard Kelch PT [phone]

## 2013-09-01 NOTE — Progress Notes (Signed)
Physical Therapy Treatment Patient Details Name: Deborah Elliott MRN: [id number] DOB: 11-25-1945 Today's Date: 09/01/2013 Time: 5638-7564 PT Time Calculation (min): 31 min  PT Assessment / Plan / Recommendation  History of Present Illness rtka   PT Comments   Pt received fluids today. Performed active assistive the ex. Tender for flexing R knee.Plans DC tomorrow if BP stays up.   Follow Up Recommendations  Home health PT     Does the patient have the potential to tolerate intense rehabilitation     Barriers to Discharge        Equipment Recommendations  None recommended by PT    Recommendations for Other Services    Frequency 7X/week   Progress towards PT Goals Progress towards PT goals: Progressing toward goals  Plan Current plan remains appropriate    Precautions / Restrictions     Pertinent Vitals/Pain R lateral knee with flexing.    Mobility       Exercises Total Joint Exercises Ankle Circles/Pumps: AROM;Both;10 reps;Supine Quad Sets: AROM;Both;10 reps;Supine Heel Slides: AAROM;Right;15 reps;Supine Hip ABduction/ADduction: AAROM;Right;10 reps;Supine Straight Leg Raises: AAROM;Right;10 reps;Supine Goniometric ROM: 10-40 at best    PT Diagnosis:    PT Problem List:   PT Treatment Interventions:     PT Goals (current goals can now be found in the care plan section)    Visit Information  Last PT Received On: 09/01/13 Assistance Needed: +1 History of Present Illness: rtka    Subjective Data      Cognition       Balance     End of Session PT - End of Session Activity Tolerance: Patient limited by fatigue;Patient tolerated treatment well Patient left: in bed;with call bell/phone within reach;with family/visitor present   GP     Rada Hay 09/01/2013, 3:41 PM

## 2013-09-01 NOTE — Discharge Summary (Signed)
Physician Discharge Summary   Patient ID: Deborah Elliott MRN: [id number] DOB/AGE: [birth year] 67 y.o.  Admit date: 08/29/2013 Discharge date: 09/01/2013  Primary Diagnosis:  Osteoarthritis Right knee(s)  Admission Diagnoses:  Past Medical History  Diagnosis Date  . Hypertension   . Arthritis of knee   . Coronary artery disease     X3 STENTS  . Dysrhythmia     HX of tachycardia - controlled with med  . Leg cramps   . Hx of skin cancer, basal cell   . GERD (gastroesophageal reflux disease)   . Sleep apnea     uses c-pap   . Cancer     skin cancer   Discharge Diagnoses:   Principal Problem:   OA (osteoarthritis) of knee Active Problems:   Postoperative anemia due to acute blood loss  Estimated body mass index is 41.07 kg/(m^2) as calculated from the following:   Height as of this encounter: 5\' 4"  (1.626 m).   Weight as of this encounter: 108.58 kg (239 lb 6 oz).  Procedure:  Procedure(s) (LRB): RIGHT TOTAL KNEE ARTHROPLASTY (Right)   Consults: None  HPI: Deborah Elliott is a 67 y.o. year old female with end stage OA of her right knee with progressively worsening pain and dysfunction. She has constant pain, with activity and at rest and significant functional deficits with difficulties even with ADLs. She has had extensive non-op management including analgesics, injections of cortisone and viscosupplements, and home exercise program, but remains in significant pain with significant dysfunction.Radiographs show bone on bone arthritis medial and patellofemoral. She presents now for right Total Knee Arthroplasty.   Laboratory Data: Admission on 08/29/2013  Component Date Value Range Status  . WBC 08/30/2013 9.3  4.0 - 10.5 K/uL Final  . RBC 08/30/2013 3.32* 3.87 - 5.11 MIL/uL Final  . Hemoglobin 08/30/2013 10.1* 12.0 - 15.0 g/dL Final  . HCT 40/98/1191 29.3* 36.0 - 46.0 % Final  . MCV 08/30/2013 88.3  78.0 - 100.0 fL Final  . MCH 08/30/2013 30.4  26.0 - 34.0 pg Final  .  MCHC 08/30/2013 34.5  30.0 - 36.0 g/dL Final  . RDW 47/82/9562 13.5  11.5 - 15.5 % Final  . Platelets 08/30/2013 155  150 - 400 K/uL Final  . Sodium 08/30/2013 135  135 - 145 mEq/L Final  . Potassium 08/30/2013 4.2  3.5 - 5.1 mEq/L Final  . Chloride 08/30/2013 103  96 - 112 mEq/L Final  . CO2 08/30/2013 22  19 - 32 mEq/L Final  . Glucose, Bld 08/30/2013 186* 70 - 99 mg/dL Final  . BUN 13/05/6577 10  6 - 23 mg/dL Final  . Creatinine, Ser 08/30/2013 0.63  0.50 - 1.10 mg/dL Final  . Calcium 46/96/2952 8.7  8.4 - 10.5 mg/dL Final  . GFR calc non Af Amer 08/30/2013 >90  >90 mL/min Final  . GFR calc Af Amer 08/30/2013 >90  >90 mL/min Final   Comment: (NOTE)                          The eGFR has been calculated using the CKD EPI equation.                          This calculation has not been validated in all clinical situations.  eGFR's persistently <90 mL/min signify possible Chronic Kidney                          Disease.  . WBC 08/31/2013 13.7* 4.0 - 10.5 K/uL Final  . RBC 08/31/2013 3.22* 3.87 - 5.11 MIL/uL Final  . Hemoglobin 08/31/2013 9.8* 12.0 - 15.0 g/dL Final  . HCT 16/07/9603 28.6* 36.0 - 46.0 % Final  . MCV 08/31/2013 88.8  78.0 - 100.0 fL Final  . MCH 08/31/2013 30.4  26.0 - 34.0 pg Final  . MCHC 08/31/2013 34.3  30.0 - 36.0 g/dL Final  . RDW 54/06/8118 13.6  11.5 - 15.5 % Final  . Platelets 08/31/2013 173  150 - 400 K/uL Final  . Sodium 08/31/2013 136  135 - 145 mEq/L Final  . Potassium 08/31/2013 4.3  3.5 - 5.1 mEq/L Final  . Chloride 08/31/2013 102  96 - 112 mEq/L Final  . CO2 08/31/2013 23  19 - 32 mEq/L Final  . Glucose, Bld 08/31/2013 156* 70 - 99 mg/dL Final  . BUN 14/78/2956 11  6 - 23 mg/dL Final  . Creatinine, Ser 08/31/2013 0.59  0.50 - 1.10 mg/dL Final  . Calcium 21/30/8657 9.1  8.4 - 10.5 mg/dL Final  . GFR calc non Af Amer 08/31/2013 >90  >90 mL/min Final  . GFR calc Af Amer 08/31/2013 >90  >90 mL/min Final   Comment: (NOTE)                           The eGFR has been calculated using the CKD EPI equation.                          This calculation has not been validated in all clinical situations.                          eGFR's persistently <90 mL/min signify possible Chronic Kidney                          Disease.  . WBC 09/01/2013 8.7  4.0 - 10.5 K/uL Final  . RBC 09/01/2013 2.84* 3.87 - 5.11 MIL/uL Final  . Hemoglobin 09/01/2013 8.6* 12.0 - 15.0 g/dL Final  . HCT 84/69/6295 25.3* 36.0 - 46.0 % Final  . MCV 09/01/2013 89.1  78.0 - 100.0 fL Final  . MCH 09/01/2013 30.3  26.0 - 34.0 pg Final  . MCHC 09/01/2013 34.0  30.0 - 36.0 g/dL Final  . RDW 28/41/3244 13.7  11.5 - 15.5 % Final  . Platelets 09/01/2013 120* 150 - 400 K/uL Final   Comment: SPECIMEN CHECKED FOR CLOTS                          REPEATED TO VERIFY                          [facility] Outpatient Visit on 08/23/2013  Component Date Value Range Status  . MRSA, PCR 08/23/2013 NEGATIVE  NEGATIVE Final  . Staphylococcus aureus 08/23/2013 NEGATIVE  NEGATIVE Final   Comment:  The Xpert SA Assay (FDA                          approved for NASAL specimens                          in patients over 29 years of age),                          is one component of                          a comprehensive surveillance                          program.  Test performance has                          been validated by [employer] for patients greater                          than or equal to 61 year old.                          It is not intended                          to diagnose infection nor to                          guide or monitor treatment.  Marland Kitchen aPTT 08/23/2013 34  24 - 37 seconds Final  . Prothrombin Time 08/23/2013 13.2  11.6 - 15.2 seconds Final  . INR 08/23/2013 1.02  0.00 - 1.49 Final  . ABO/RH(D) 08/23/2013 O NEG   Final  . Antibody Screen 08/23/2013 NEG   Final  . Sample  Expiration 08/23/2013 09/01/2013   Final  . ABO/RH(D) 08/23/2013 O NEG   Final     X-Rays:Dg Chest 2 View  08/23/2013   CLINICAL DATA:  Preoperative evaluation for right total knee surgery, history hypertension, coronary artery disease, sleep apnea  EXAM: CHEST  2 VIEW  COMPARISON:  01/17/2011  FINDINGS: Upper normal heart size.  Tortuous aorta.  Pulmonary vascularity normal.  Lungs clear.  No pleural effusion or pneumothorax.  No acute osseous findings.  IMPRESSION: No acute abnormalities.   Electronically Signed   By: Ulyses Southward M.D.   On: 08/23/2013 13:27    EKG: Orders placed in visit on 03/24/13  . EKG 12-LEAD     Hospital Course: Juleen Sorrels Bucaro is a 67 y.o. who was admitted to [facility]. They were brought to the operating room on 08/29/2013 and underwent Procedure(s): RIGHT TOTAL KNEE ARTHROPLASTY.  Patient tolerated the procedure well and was later transferred to the recovery room and then to the orthopaedic floor for postoperative care.  They were given PO and IV analgesics for pain control following their surgery.  They were given 24 hours of postoperative antibiotics of  Anti-infectives   Start     Dose/Rate Route Frequency Ordered Stop  08/29/13 1600  ceFAZolin (ANCEF) IVPB 1 g/50 mL premix     1 g 100 mL/hr over 30 Minutes Intravenous Every 6 hours 08/29/13 1317 08/29/13 2242   08/29/13 0745  ceFAZolin (ANCEF) IVPB 2 g/50 mL premix     2 g 100 mL/hr over 30 Minutes Intravenous On call to O.R. 08/29/13 1914 08/29/13 1039     and started on DVT prophylaxis in the form of Xarelto.   PT and OT were ordered for total joint protocol.  Discharge planning consulted to help with postop disposition and equipment needs.  Patient had a decent night on the evening of surgery.  They started to get up OOB with therapy on day one walking 25 feet. Hemovac drain was pulled without difficulty.  Continued to work with therapy into day two walking 50 feet.  Dressing was changed on day two  and the incision was healing well.  By day three, the patient had progressed with therapy and meeting their goals.  Incision was healing well.  Patient was seen in rounds and was ready to go home.   Discharge Medications: Prior to Admission medications   Medication Sig Start Date End Date Taking? Authorizing Provider  acetaminophen (TYLENOL) 500 MG tablet Take 1,000 mg by mouth daily as needed. For pain   Yes Historical Provider, MD  bismuth subsalicylate (PEPTO BISMOL) 262 MG/15ML suspension Take 10 mLs by mouth every 6 (six) hours as needed (upset stomach).   Yes Historical Provider, MD  cetirizine (ZYRTEC) 10 MG tablet Take 10 mg by mouth daily.   Yes Historical Provider, MD  furosemide (LASIX) 40 MG tablet Take 20 mg by mouth every morning.    Yes Historical Provider, MD  lansoprazole (PREVACID) 15 MG capsule Take 15 mg by mouth daily.   Yes Historical Provider, MD  potassium chloride SA (K-DUR,KLOR-CON) 20 MEQ tablet Take 20 mEq by mouth daily.   Yes Historical Provider, MD  psyllium (HYDROCIL/METAMUCIL) 95 % PACK Take 1 packet by mouth daily.   Yes Historical Provider, MD  rosuvastatin (CRESTOR) 20 MG tablet Take 20 mg by mouth every evening.    Yes Historical Provider, MD  sodium chloride (OCEAN) 0.65 % nasal spray Place 1 spray into the nose daily as needed for congestion.   Yes Historical Provider, MD  fosinopril (MONOPRIL) 20 MG tablet Take 20 mg by mouth every morning.     Historical Provider, MD  HYDROcodone-acetaminophen (NORCO/VICODIN) 5-325 MG per tablet Take 1-2 tablets by mouth every 4 (four) hours as needed for moderate pain. 09/01/13   Alexzandrew Perkins, PA-C  methocarbamol (ROBAXIN) 500 MG tablet Take 1 tablet (500 mg total) by mouth every 6 (six) hours as needed for muscle spasms. 09/01/13   Alexzandrew Perkins, PA-C  metoprolol succinate (TOPROL-XL) 50 MG 24 hr tablet Take 50 mg by mouth every morning. Take with or immediately following a meal.    Historical Provider, MD    rivaroxaban (XARELTO) 10 MG TABS tablet Take 1 tablet (10 mg total) by mouth daily with breakfast. Take Xarelto for two and a half more weeks, then discontinue Xarelto. Once the patient has completed the Xarelto, they may resume the Plavix 75 mg. 09/01/13   Alexzandrew Julien Girt, PA-C  traMADol (ULTRAM) 50 MG tablet Take 1-2 tablets (50-100 mg total) by mouth every 6 (six) hours as needed (mild pain). 09/01/13   Alexzandrew Julien Girt, PA-C   Discharge home with home health  Diet - Cardiac diet  Follow up - in 2 weeks  Activity -  WBAT  Disposition - Home  Condition Upon Discharge - Good  D/C Meds - See DC Summary  DVT Prophylaxis - Xarelto, Plavix 75 mg on hold for three weeks and then resume when finishes the Xarelto.       Discharge Orders   Future Orders Complete By Expires   Call MD / Call 911  As directed    Comments:     If you experience chest pain or shortness of breath, CALL 911 and be transported to the hospital emergency room.  If you develope a fever above 101 F, pus (white drainage) or increased drainage or redness at the wound, or calf pain, call your surgeon's office.   Change dressing  As directed    Comments:     Change dressing daily with sterile 4 x 4 inch gauze dressing and apply TED hose. Do not submerge the incision under water.   Constipation Prevention  As directed    Comments:     Drink plenty of fluids.  Prune juice may be helpful.  You may use a stool softener, such as Colace (over the counter) 100 mg twice a day.  Use MiraLax (over the counter) for constipation as needed.   Diet - low sodium heart healthy  As directed    Diet Carb Modified  As directed    Discharge instructions  As directed    Comments:     Pick up stool softner and laxative for home. Do not submerge incision under water. May shower. Continue to use ice for pain and swelling from surgery.  Take Xarelto for two and a half more weeks, then discontinue Xarelto. Once the patient has completed  the Xarelto, they may resume the Plavix 75 mg at home.   Do not put a pillow under the knee. Place it under the heel.  As directed    Do not sit on low chairs, stoools or toilet seats, as it may be difficult to get up from low surfaces  As directed    Driving restrictions  As directed    Comments:     No driving until released by the physician.   Increase activity slowly as tolerated  As directed    Lifting restrictions  As directed    Comments:     No lifting until released by the physician.   Patient may shower  As directed    Comments:     You may shower without a dressing once there is no drainage.  Do not wash over the wound.  If drainage remains, do not shower until drainage stops.   TED hose  As directed    Comments:     Use stockings (TED hose) for 3 weeks on both leg(s).  You may remove them at night for sleeping.   Weight bearing as tolerated  As directed    Questions:     Laterality:     Extremity:         Medication List    STOP taking these medications       CALTRATE 600+D PO     clopidogrel 75 MG tablet  Commonly known as:  PLAVIX     OSTEO BI-FLEX JOINT SHIELD PO      TAKE these medications       acetaminophen 500 MG tablet  Commonly known as:  TYLENOL  Take 1,000 mg by mouth daily as needed. For pain     bismuth subsalicylate 262 MG/15ML suspension  Commonly known as:  PEPTO  BISMOL  Take 10 mLs by mouth every 6 (six) hours as needed (upset stomach).     cetirizine 10 MG tablet  Commonly known as:  ZYRTEC  Take 10 mg by mouth daily.     fosinopril 20 MG tablet  Commonly known as:  MONOPRIL  Take 20 mg by mouth every morning.     furosemide 40 MG tablet  Commonly known as:  LASIX  Take 20 mg by mouth every morning.     HYDROcodone-acetaminophen 5-325 MG per tablet  Commonly known as:  NORCO/VICODIN  Take 1-2 tablets by mouth every 4 (four) hours as needed for moderate pain.     lansoprazole 15 MG capsule  Commonly known as:  PREVACID  Take  15 mg by mouth daily.     methocarbamol 500 MG tablet  Commonly known as:  ROBAXIN  Take 1 tablet (500 mg total) by mouth every 6 (six) hours as needed for muscle spasms.     metoprolol succinate 50 MG 24 hr tablet  Commonly known as:  TOPROL-XL  Take 50 mg by mouth every morning. Take with or immediately following a meal.     potassium chloride SA 20 MEQ tablet  Commonly known as:  K-DUR,KLOR-CON  Take 20 mEq by mouth daily.     psyllium 95 % Pack  Commonly known as:  HYDROCIL/METAMUCIL  Take 1 packet by mouth daily.     rivaroxaban 10 MG Tabs tablet  Commonly known as:  XARELTO  - Take 1 tablet (10 mg total) by mouth daily with breakfast. Take Xarelto for two and a half more weeks, then discontinue Xarelto.  - Once the patient has completed the Xarelto, they may resume the Plavix 75 mg.     rosuvastatin 20 MG tablet  Commonly known as:  CRESTOR  Take 20 mg by mouth every evening.     sodium chloride 0.65 % nasal spray  Commonly known as:  OCEAN  Place 1 spray into the nose daily as needed for congestion.     traMADol 50 MG tablet  Commonly known as:  ULTRAM  Take 1-2 tablets (50-100 mg total) by mouth every 6 (six) hours as needed (mild pain).       Follow-up Information   Follow up with Loanne Drilling, MD. Schedule an appointment as soon as possible for a visit on 09/13/2013.   Specialty:  Orthopedic Surgery   Contact information:   [address] [place] [address] [phone]       Signed: Patrica Duel 09/01/2013, 8:15 AM

## 2013-09-01 NOTE — Progress Notes (Addendum)
   Subjective: 3 Days Post-Op Procedure(s) (LRB): RIGHT TOTAL KNEE ARTHROPLASTY (Right) Patient reports pain as mild.   Patient seen in rounds by Dr. Lequita Halt. Patient is well, and has had no acute complaints or problems Patient is ready to go home  Objective: Vital signs in last 24 hours: Temp:  [97.7 F (36.5 C)-100 F (37.8 C)] 100 F (37.8 C) (11/13 0610) Pulse Rate:  [64-91] 79 (11/13 0610) Resp:  [16-18] 18 (11/13 0610) BP: (132-178)/(74-91) 172/91 mmHg (11/13 0610) SpO2:  [93 %-97 %] 93 % (11/13 0610)  Intake/Output from previous day:  Intake/Output Summary (Last 24 hours) at 09/01/13 0807 Last data filed at 08/31/13 1300  Gross per 24 hour  Intake    480 ml  Output      0 ml  Net    480 ml    Intake/Output this shift:    Labs:  Recent Labs  08/30/13 0433 08/31/13 0455 09/01/13 0433  HGB 10.1* 9.8* 8.6*    Recent Labs  08/31/13 0455 09/01/13 0433  WBC 13.7* 8.7  RBC 3.22* 2.84*  HCT 28.6* 25.3*  PLT 173 120*    Recent Labs  08/30/13 0433 08/31/13 0455  NA 135 136  K 4.2 4.3  CL 103 102  CO2 22 23  BUN 10 11  CREATININE 0.63 0.59  GLUCOSE 186* 156*  CALCIUM 8.7 9.1   No results found for this basename: LABPT, INR,  in the last 72 hours  EXAM: General - Patient is Alert, Appropriate and Oriented Extremity - Neurovascular intact Sensation intact distally Dorsiflexion/Plantar flexion intact Incision - clean, dry, no drainage Motor Function - intact, moving foot and toes well on exam.   Assessment/Plan: 3 Days Post-Op Procedure(s) (LRB): RIGHT TOTAL KNEE ARTHROPLASTY (Right) Procedure(s) (LRB): RIGHT TOTAL KNEE ARTHROPLASTY (Right) Past Medical History  Diagnosis Date  . Hypertension   . Arthritis of knee   . Coronary artery disease     X3 STENTS  . Dysrhythmia     HX of tachycardia - controlled with med  . Leg cramps   . Hx of skin cancer, basal cell   . GERD (gastroesophageal reflux disease)   . Sleep apnea     uses c-pap    . Cancer     skin cancer   Principal Problem:   OA (osteoarthritis) of knee Active Problems:   Postoperative anemia due to acute blood loss  Estimated body mass index is 41.07 kg/(m^2) as calculated from the following:   Height as of this encounter: 5\' 4"  (1.626 m).   Weight as of this encounter: 108.58 kg (239 lb 6 oz). Up with therapy Discharge home with home health Diet - Cardiac diet Follow up - in 2 weeks Activity - WBAT Disposition - Home Condition Upon Discharge - Good D/C Meds - See DC Summary DVT Prophylaxis - Xarelto, Plavix 75 mg on hold for three weeks and then resume when finishes the Xarelto.   Deborah Elliott 09/01/2013, 8:07 AM

## 2013-09-01 NOTE — Progress Notes (Signed)
1115 patient b/p dropped and was diaphoretic with physical therapy, vitals signs charted and Gareth Eagle PA notified with orders received  D Susann Givens RN

## 2013-09-01 NOTE — Progress Notes (Signed)
Pt already on CPAP when RT came by.  Machine is working properly and Pt is tolerating well at this time, RT to monitor and assess as needed.

## 2013-09-02 NOTE — Progress Notes (Signed)
Pt to d/c home. AVS reviewed and "My Chart" discussed with pt. Pt capable of verbalizing medications and follow-up appointments. Remains hemodynamically stable. No signs and symptoms of distress. Educated pt to return to ER in the case of SOB, dizziness, or chest pain.  

## 2013-09-02 NOTE — Progress Notes (Addendum)
Physical Therapy Treatment Patient Details Name: Deborah Elliott MRN: [id number] DOB: October 13, 1946 Today's Date: 09/02/2013 Time: 4098-1191 PT Time Calculation (min): 18 min  PT Assessment / Plan / Recommendation  History of Present Illness R TKA   PT Comments   *Stair training completed with pt and her family. Ready to DC home from PT standpoint. Encouraged pt to ambulate frequently. **  Follow Up Recommendations  Home health PT     Does the patient have the potential to tolerate intense rehabilitation     Barriers to Discharge        Equipment Recommendations  None recommended by PT    Recommendations for Other Services    Frequency 7X/week   Progress towards PT Goals Progress towards PT goals: Progressing toward goals  Plan Current plan remains appropriate    Precautions / Restrictions Precautions Precautions: Knee;Fall Precaution Comments: pt has developed blisters under dressing. Required Braces or Orthoses: Knee Immobilizer - Right Knee Immobilizer - Right: Discontinue once straight leg raise with < 10 degree lag   Pertinent Vitals/Pain **3/10 R knee RN notified Ice deferred 2* pt being discharged*    Mobility   Transfers Sit to Stand: With upper extremity assist;From chair/3-in-1;5: Supervision Stand to Sit: With upper extremity assist;To chair/3-in-1;With armrests;4: Min guard;5: Supervision Details for Transfer Assistance: pt began c/o's of feeling nausea, dizzy, "Like I will pass out". Pt diaphoretic.VS taken and RN summoned. Ambulation/Gait Ambulation/Gait Assistance: 4: Min guard (follow with chair for safety due to previous dizziness with RN) Ambulation Distance (Feet): 20 Feet Assistive device: Rolling walker Gait Pattern: Step-to pattern;Antalgic;Decreased step length - right;Decreased stance time - right Gait velocity: decr General Gait Details: cues for positioning in RW, steps all the way to front of RW, VCs to lift head Stairs: Yes Stairs  Assistance: 4: Min assist Stairs Assistance Details (indicate cue type and reason): VCs sequencing, min A to steady RW, husband and son present during stair training Stair Management Technique: No rails;Backwards Number of Stairs: 1    Exercises Total Joint Exercises Ankle Circles/Pumps: AROM;Both;10 reps  Heel Slides: AAROM;Right;10 reps    PT Diagnosis:    PT Problem List:   PT Treatment Interventions:     PT Goals (current goals can now be found in the care plan section) Acute Rehab PT Goals Patient Stated Goal: I want to walk, not fall PT Goal Formulation: With patient Time For Goal Achievement: 09/06/13 Potential to Achieve Goals: Good  Visit Information  Last PT Received On: 09/02/13 Assistance Needed: +1 History of Present Illness: R TKA    Subjective Data  Patient Stated Goal: I want to walk, not fall   Cognition  Cognition Arousal/Alertness: Awake/alert Behavior During Therapy: WFL for tasks assessed/performed Overall Cognitive Status: Within Functional Limits for tasks assessed    Balance     End of Session PT - End of Session Equipment Utilized During Treatment: Right knee immobilizer;Gait belt Activity Tolerance: Patient limited by pain;Treatment limited secondary to medical complications (Comment) (nausea) Patient left: with call bell/phone within reach;in chair;with family/visitor present Nurse Communication: Mobility status;Other (comment) (BP)   GP     Ralene Bathe Kistler 09/02/2013, 1:34 PM [phone]

## 2013-09-02 NOTE — Progress Notes (Signed)
   Subjective: 4 Days Post-Op Procedure(s) (LRB): RIGHT TOTAL KNEE ARTHROPLASTY (Right) Patient reports pain as mild.   Patient seen in rounds with Dr. Lequita Halt.  She is feeling much better today. Patient is feeling much better today.  She had pressure issues yesterday but imoproved with fluids. Patient is ready to go home today as long as she does well with therapy.  Objective: Vital signs in last 24 hours: Temp:  [98.4 F (36.9 C)-98.8 F (37.1 C)] 98.4 F (36.9 C) (11/14 0510) Pulse Rate:  [76-93] 76 (11/14 0510) Resp:  [16-18] 16 (11/14 0510) BP: (106-163)/(64-84) 149/72 mmHg (11/14 0510) SpO2:  [94 %-100 %] 97 % (11/14 0510)  Intake/Output from previous day:  Intake/Output Summary (Last 24 hours) at 09/02/13 0714 Last data filed at 09/02/13 0517  Gross per 24 hour  Intake 1493.33 ml  Output   2300 ml  Net -806.67 ml    Intake/Output this shift:    Labs:  Recent Labs  08/31/13 0455 09/01/13 0433  HGB 9.8* 8.6*    Recent Labs  08/31/13 0455 09/01/13 0433  WBC 13.7* 8.7  RBC 3.22* 2.84*  HCT 28.6* 25.3*  PLT 173 120*    Recent Labs  08/31/13 0455  NA 136  K 4.3  CL 102  CO2 23  BUN 11  CREATININE 0.59  GLUCOSE 156*  CALCIUM 9.1   No results found for this basename: LABPT, INR,  in the last 72 hours  EXAM: General - Patient is Alert, Appropriate and Oriented Extremity - Neurovascular intact Sensation intact distally Dorsiflexion/Plantar flexion intact Incision - clean, dry, no drainage, healing, Several blisters noted along incision. Motor Function - intact, moving foot and toes well on exam.   Assessment/Plan: 4 Days Post-Op Procedure(s) (LRB): RIGHT TOTAL KNEE ARTHROPLASTY (Right) Procedure(s) (LRB): RIGHT TOTAL KNEE ARTHROPLASTY (Right) Past Medical History  Diagnosis Date  . Hypertension   . Arthritis of knee   . Coronary artery disease     X3 STENTS  . Dysrhythmia     HX of tachycardia - controlled with med  . Leg cramps   .  Hx of skin cancer, basal cell   . GERD (gastroesophageal reflux disease)   . Sleep apnea     uses c-pap   . Cancer     skin cancer   Principal Problem:   OA (osteoarthritis) of knee Active Problems:   Postoperative anemia due to acute blood loss  Estimated body mass index is 41.07 kg/(m^2) as calculated from the following:   Height as of this encounter: 5\' 4"  (1.626 m).   Weight as of this encounter: 108.58 kg (239 lb 6 oz). Discharge home with home health  Diet - Cardiac diet  Follow up - in 2 weeks  Activity - WBAT  Disposition - Home  Condition Upon Discharge - Good  D/C Meds - See DC Summary  DVT Prophylaxis - Xarelto, Plavix 75 mg on hold for three weeks and then resume when finishes the Xarelto.  Deborah Elliott 09/02/2013, 7:14 AM

## 2013-09-02 NOTE — Progress Notes (Signed)
Physical Therapy Treatment Patient Details Name: Deborah Elliott MRN: [id number] DOB: 06-Aug-1946 Today's Date: 09/02/2013 Time: 6644-0347 PT Time Calculation (min): 40 min  PT Assessment / Plan / Recommendation  History of Present Illness R TKA   PT Comments   **Seems to be feeling better today. Checked orthostatics. Supine 153/78, sitting 143/76, standing 140/75 with minimal dizziness reported. Pt walked 40', distance limited by pain and nausea. Pt would benefit from one more PT visit today prior to DC home. Will do stair training with pt and husband who should arrive later this morning. *  Follow Up Recommendations  Home health PT     Does the patient have the potential to tolerate intense rehabilitation     Barriers to Discharge        Equipment Recommendations  None recommended by PT    Recommendations for Other Services    Frequency 7X/week   Progress towards PT Goals Progress towards PT goals: Progressing toward goals  Plan Current plan remains appropriate    Precautions / Restrictions Precautions Precautions: Knee;Fall Precaution Comments: pt has developed blisters under dressing. Required Braces or Orthoses: Knee Immobilizer - Right Knee Immobilizer - Right: Discontinue once straight leg raise with < 10 degree lag Restrictions Weight Bearing Restrictions: No   Pertinent Vitals/Pain **2/10 R knee Premedicated, ice applied*    Mobility  Bed Mobility Supine to Sit: 4: Min assist;HOB elevated Details for Bed Mobility Assistance: support  of R leg out of bed Transfers Sit to Stand: 4: Min guard;With upper extremity assist;From bed Stand to Sit: With upper extremity assist;4: Min assist;To chair/3-in-1;With armrests Details for Transfer Assistance: pt began c/o's of feeling nausea, dizzy, "Like I will pass out". Pt diaphoretic.VS taken and RN summoned. Ambulation/Gait Ambulation/Gait Assistance: 4: Min assist (follow with chair for safety due to previous dizziness  with RN) Ambulation Distance (Feet): 40 Feet Assistive device: Rolling walker Gait Pattern: Step-to pattern;Antalgic;Decreased step length - right;Decreased stance time - right Gait velocity: decr General Gait Details: cues for positioning in RW, steps all the way to front of RW, VCs to lift head    Exercises Total Joint Exercises Ankle Circles/Pumps: AROM;Both;10 reps;Supine Quad Sets: AROM;Both;10 reps;Supine Short Arc Quad: AAROM;Right;10 reps;Supine Heel Slides: AAROM;Right;15 reps;Supine Hip ABduction/ADduction: AAROM;Right;10 reps;Supine Straight Leg Raises: AAROM;Right;10 reps;Supine   PT Diagnosis:    PT Problem List:   PT Treatment Interventions:     PT Goals (current goals can now be found in the care plan section) Acute Rehab PT Goals Patient Stated Goal: I want to walk, not fall PT Goal Formulation: With patient Time For Goal Achievement: 09/06/13 Potential to Achieve Goals: Good  Visit Information  Last PT Received On: 09/02/13 Assistance Needed: +1 History of Present Illness: R TKA    Subjective Data  Patient Stated Goal: I want to walk, not fall   Cognition  Cognition Arousal/Alertness: Awake/alert Behavior During Therapy: WFL for tasks assessed/performed Overall Cognitive Status: Within Functional Limits for tasks assessed    Balance     End of Session PT - End of Session Equipment Utilized During Treatment: Right knee immobilizer Activity Tolerance: Patient limited by pain;Treatment limited secondary to medical complications (Comment) (nausea) Patient left: with call bell/phone within reach;in chair Nurse Communication: Mobility status;Other (comment) (BP) CPM Right Knee CPM Right Knee: Off   GP     Ralene Bathe Kistler 09/02/2013, 9:50 AM [phone]

## 2013-09-26 ENCOUNTER — Ambulatory Visit: Payer: Medicare Other | Attending: Orthopedic Surgery | Admitting: Physical Therapy

## 2013-09-26 DIAGNOSIS — R5381 Other malaise: Secondary | ICD-10-CM | POA: Insufficient documentation

## 2013-09-26 DIAGNOSIS — M25669 Stiffness of unspecified knee, not elsewhere classified: Secondary | ICD-10-CM | POA: Insufficient documentation

## 2013-09-26 DIAGNOSIS — IMO0001 Reserved for inherently not codable concepts without codable children: Secondary | ICD-10-CM | POA: Insufficient documentation

## 2013-09-26 DIAGNOSIS — M25569 Pain in unspecified knee: Secondary | ICD-10-CM | POA: Insufficient documentation

## 2013-09-26 DIAGNOSIS — R269 Unspecified abnormalities of gait and mobility: Secondary | ICD-10-CM | POA: Insufficient documentation

## 2013-09-28 ENCOUNTER — Ambulatory Visit: Payer: Medicare Other | Admitting: Physical Therapy

## 2013-09-29 ENCOUNTER — Ambulatory Visit: Payer: Medicare Other | Admitting: Physical Therapy

## 2013-09-29 ENCOUNTER — Encounter: Payer: Medicare Other | Admitting: *Deleted

## 2013-09-30 ENCOUNTER — Ambulatory Visit (INDEPENDENT_AMBULATORY_CARE_PROVIDER_SITE_OTHER): Payer: Medicare Other | Admitting: Cardiovascular Disease

## 2013-09-30 ENCOUNTER — Encounter: Payer: Self-pay | Admitting: Cardiovascular Disease

## 2013-09-30 VITALS — BP 110/60 | HR 76 | Ht 64.0 in | Wt 227.0 lb

## 2013-09-30 DIAGNOSIS — I2510 Atherosclerotic heart disease of native coronary artery without angina pectoris: Secondary | ICD-10-CM

## 2013-09-30 DIAGNOSIS — E785 Hyperlipidemia, unspecified: Secondary | ICD-10-CM

## 2013-09-30 DIAGNOSIS — I10 Essential (primary) hypertension: Secondary | ICD-10-CM

## 2013-09-30 NOTE — Assessment & Plan Note (Signed)
Status post PCI and stenting of her LAD in 3 places by myself using Taxus drug-eluting stents 03/13/10. She had normal LV function.  She denies chest pain or shortness of breath.

## 2013-09-30 NOTE — Patient Instructions (Signed)
Your physician wants you to follow-up in: 1 year with Dr Berry. You will receive a reminder letter in the mail two months in advance. If you don't receive a letter, please call our office to schedule the follow-up appointment.  

## 2013-09-30 NOTE — Assessment & Plan Note (Signed)
On statin therapy followed by her PCP 

## 2013-09-30 NOTE — Progress Notes (Signed)
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 I saw her 6 months ago. She has obstructive sleep apnea on CPAP for which she sees Dr. Daphene Jaeger. She has a history of CAD status post PCI and stenting of her LAD in 3 places by myself with Taxus drug-eluting stents back on Mar 14, 2010, after being admitted with unstable angina. She had a normal EF. She did have a 50% to 60% mid RCA lesion that was treated medically. Her other problems include hypertension and hyperlipidemia. She is otherwise asymptomatic.Dr. Jacky Kindle follows her lipid profile closely.    Current Outpatient Prescriptions  Medication Sig Dispense Refill  . acetaminophen (TYLENOL) 500 MG tablet Take 1,000 mg by mouth daily as needed. For pain      . bismuth subsalicylate (PEPTO BISMOL) 262 MG/15ML suspension Take 10 mLs by mouth every 6 (six) hours as needed (upset stomach).      . cetirizine (ZYRTEC) 10 MG tablet Take 10 mg by mouth daily.      . clopidogrel (PLAVIX) 75 MG tablet Take 75 mg by mouth once.       . fosinopril (MONOPRIL) 20 MG tablet Take 20 mg by mouth every morning.       . furosemide (LASIX) 40 MG tablet Take 20 mg by mouth every morning.       Marland Kitchen HYDROcodone-acetaminophen (NORCO/VICODIN) 5-325 MG per tablet Take 1-2 tablets by mouth every 4 (four) hours as needed for moderate pain.  80 tablet  0  . lansoprazole (PREVACID) 15 MG capsule Take 15 mg by mouth daily.      . methocarbamol (ROBAXIN) 500 MG tablet Take 1 tablet (500 mg total) by mouth every 6 (six) hours as needed for muscle spasms.  80 tablet  0  . metoprolol succinate (TOPROL-XL) 50 MG 24 hr tablet Take 50 mg by mouth every morning. Take  with or immediately following a meal.      . potassium chloride SA (K-DUR,KLOR-CON) 20 MEQ tablet Take 20 mEq by mouth daily.      . psyllium (HYDROCIL/METAMUCIL) 95 % PACK Take 1 packet by mouth daily.      . rivaroxaban (XARELTO) 10 MG TABS tablet Take 1 tablet (10 mg total) by mouth daily with breakfast. Take Xarelto for two and a half more weeks, then discontinue Xarelto. Once the patient has completed the Xarelto, they may resume the Plavix 75 mg.  18 tablet  0  . rosuvastatin (CRESTOR) 20 MG tablet Take 20 mg by mouth every evening.       . sodium chloride (OCEAN) 0.65 % nasal spray Place 1 spray into the nose daily as needed for congestion.      . traMADol (ULTRAM) 50 MG tablet Take 1-2 tablets (50-100 mg total) by mouth every 6 (six) hours as needed (mild pain).  60 tablet  0   No current facility-administered medications for this visit.    Allergies  Allergen Reactions  . Morphine And Related Other (See Comments)    Reaction unknown per patient  . Sulfur Itching and Rash    History   Social History  . Marital Status: Married    Spouse Name: N/A    Number  of Children: N/A  . Years of Education: N/A   Occupational History  . Not on file.   Social History Main Topics  . Smoking status: Former Smoker    Quit date: 08/23/1970  . Smokeless tobacco: Not on file  . Alcohol Use: No  . Drug Use: No  . Sexual Activity: Not on file   Other Topics Concern  . Not on file   Social History Narrative  . No narrative on file     Review of Systems: General: negative for chills, fever, night sweats or weight changes.  Cardiovascular: negative for chest pain, dyspnea on exertion, edema, orthopnea, palpitations, paroxysmal nocturnal dyspnea or shortness of breath Dermatological: negative for rash Respiratory: negative for cough or wheezing Urologic: negative for hematuria Abdominal: negative for nausea, vomiting, diarrhea, bright red blood per rectum, melena, or  hematemesis Neurologic: negative for visual changes, syncope, or dizziness All other systems reviewed and are otherwise negative except as noted above.    Blood pressure 110/60, pulse 76, height 5\' 4"  (1.626 m), weight 227 lb (102.967 kg).  General appearance: alert and no distress Neck: no adenopathy, no carotid bruit, no JVD, supple, symmetrical, trachea midline and thyroid not enlarged, symmetric, no tenderness/mass/nodules Lungs: clear to auscultation bilaterally Heart: regular rate and rhythm, S1, S2 normal, no murmur, click, rub or gallop Extremities: extremities normal, atraumatic, no cyanosis or edema Pulses: 2+ and symmetric  EKG normal sinus rhythm 76 with LVH and QRS widening  ASSESSMENT AND PLAN:   Coronary artery disease Status post PCI and stenting of her LAD in 3 places by myself using Taxus drug-eluting stents 03/13/10. She had normal LV function.  She denies chest pain or shortness of breath.  Dyslipidemia On statin therapy followed by her PCP  Essential hypertension Well-controlled on current medications      Runell Gess MD [facility], [facility] 09/30/2013 2:03 PM

## 2013-09-30 NOTE — Assessment & Plan Note (Signed)
Well-controlled on current medications 

## 2013-10-03 ENCOUNTER — Ambulatory Visit: Payer: Medicare Other | Admitting: Physical Therapy

## 2013-10-05 ENCOUNTER — Ambulatory Visit: Payer: Medicare Other | Admitting: Physical Therapy

## 2013-10-06 ENCOUNTER — Ambulatory Visit: Payer: Medicare Other | Admitting: *Deleted

## 2013-10-10 ENCOUNTER — Ambulatory Visit: Payer: Medicare Other | Admitting: *Deleted

## 2013-10-12 ENCOUNTER — Ambulatory Visit: Payer: Medicare Other | Admitting: Physical Therapy

## 2013-10-17 ENCOUNTER — Ambulatory Visit: Payer: Medicare Other | Admitting: *Deleted

## 2013-10-19 ENCOUNTER — Ambulatory Visit: Payer: Medicare Other | Admitting: *Deleted

## 2013-10-21 ENCOUNTER — Encounter: Payer: Medicare Other | Admitting: Physical Therapy

## 2013-10-24 ENCOUNTER — Ambulatory Visit: Payer: Medicare Other | Attending: Orthopedic Surgery | Admitting: Physical Therapy

## 2013-10-24 DIAGNOSIS — M25569 Pain in unspecified knee: Secondary | ICD-10-CM | POA: Insufficient documentation

## 2013-10-24 DIAGNOSIS — R269 Unspecified abnormalities of gait and mobility: Secondary | ICD-10-CM | POA: Insufficient documentation

## 2013-10-24 DIAGNOSIS — R5381 Other malaise: Secondary | ICD-10-CM | POA: Insufficient documentation

## 2013-10-24 DIAGNOSIS — M25669 Stiffness of unspecified knee, not elsewhere classified: Secondary | ICD-10-CM | POA: Insufficient documentation

## 2013-10-24 DIAGNOSIS — IMO0001 Reserved for inherently not codable concepts without codable children: Secondary | ICD-10-CM | POA: Insufficient documentation

## 2013-10-26 ENCOUNTER — Ambulatory Visit: Payer: Medicare Other | Admitting: Physical Therapy

## 2013-10-28 ENCOUNTER — Ambulatory Visit: Payer: Medicare Other | Admitting: Physical Therapy

## 2013-10-31 ENCOUNTER — Other Ambulatory Visit: Payer: Self-pay | Admitting: Cardiovascular Disease

## 2013-10-31 ENCOUNTER — Ambulatory Visit: Payer: Medicare Other | Admitting: Physical Therapy

## 2013-10-31 NOTE — Telephone Encounter (Signed)
Rx was sent to pharmacy electronically. 

## 2013-11-02 ENCOUNTER — Ambulatory Visit: Payer: Medicare Other | Admitting: Physical Therapy

## 2013-11-04 ENCOUNTER — Ambulatory Visit: Payer: Medicare Other | Admitting: Physical Therapy

## 2013-11-07 ENCOUNTER — Ambulatory Visit: Payer: Medicare Other | Admitting: Physical Therapy

## 2013-11-09 ENCOUNTER — Encounter: Payer: Medicare Other | Admitting: Physical Therapy

## 2013-11-11 ENCOUNTER — Encounter: Payer: Medicare Other | Admitting: Physical Therapy

## 2014-02-16 ENCOUNTER — Other Ambulatory Visit: Payer: Self-pay

## 2014-02-16 DIAGNOSIS — Z1231 Encounter for screening mammogram for malignant neoplasm of breast: Secondary | ICD-10-CM

## 2014-03-01 ENCOUNTER — Ambulatory Visit
Admission: RE | Admit: 2014-03-01 | Discharge: 2014-03-01 | Disposition: A | Discharge status: Home / Self Care | Payer: Medicare Other | Source: Ambulatory Visit

## 2014-03-01 ENCOUNTER — Encounter (INDEPENDENT_AMBULATORY_CARE_PROVIDER_SITE_OTHER): Payer: Self-pay

## 2014-03-01 DIAGNOSIS — Z1231 Encounter for screening mammogram for malignant neoplasm of breast: Secondary | ICD-10-CM

## 2014-09-21 ENCOUNTER — Encounter: Payer: Self-pay | Admitting: Cardiovascular Disease

## 2014-09-21 ENCOUNTER — Ambulatory Visit (INDEPENDENT_AMBULATORY_CARE_PROVIDER_SITE_OTHER): Payer: Medicare Other | Admitting: Cardiovascular Disease

## 2014-09-21 VITALS — BP 132/80 | HR 69 | Ht 65.5 in | Wt 245.0 lb

## 2014-09-21 DIAGNOSIS — I2510 Atherosclerotic heart disease of native coronary artery without angina pectoris: Secondary | ICD-10-CM

## 2014-09-21 DIAGNOSIS — E785 Hyperlipidemia, unspecified: Secondary | ICD-10-CM

## 2014-09-21 DIAGNOSIS — E669 Obesity, unspecified: Secondary | ICD-10-CM

## 2014-09-21 DIAGNOSIS — I10 Essential (primary) hypertension: Secondary | ICD-10-CM

## 2014-09-21 NOTE — Assessment & Plan Note (Signed)
History of CAD status post PCI and stenting of her LAD in 3 places by myself with Taxus drug-eluting stents 03/14/10 after being admitted with unstable angina. She has a normal EF. She denies chest pain or shortness of breath.

## 2014-09-21 NOTE — Assessment & Plan Note (Signed)
History of hyperlipidemia on rosuvastatin 20 mg a day.this is followed by her PCP. We'll we'll obtain a copy of her most recent blood work

## 2014-09-21 NOTE — Assessment & Plan Note (Signed)
She has gained 20 pounds since she was here a year ago. She does admit to dietary indiscretion and says that she goes to checks to have hamburgers and hot dogs. We talked about the importance of weight loss, diet and exercise as well as a heart healthy diet.

## 2014-09-21 NOTE — Progress Notes (Signed)
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 I saw her 12 months ago. She has obstructive sleep apnea on CPAP for which she sees Dr. Ellouise Newer. She has a history of CAD status post PCI and stenting of her LAD in 3 places by myself with Taxus drug-eluting stents back on Mar 14, 2010, after being admitted with unstable angina. She had a normal EF. She did have a 50% to 60% mid RCA lesion that was treated medically. Her other problems include hypertension and hyperlipidemia. She is otherwise asymptomatic.Dr. Reynaldo Minium follows her lipid profile closely   Current Outpatient Prescriptions  Medication Sig Dispense Refill  . acetaminophen (TYLENOL) 500 MG tablet Take 1,000 mg by mouth daily as needed. For pain    . bismuth subsalicylate (PEPTO BISMOL) 262 MG/15ML suspension Take 10 mLs by mouth every 6 (six) hours as needed (upset stomach).    . cetirizine (ZYRTEC) 10 MG tablet Take 10 mg by mouth daily.    . clopidogrel (PLAVIX) 75 MG tablet TAKE 1 TABLET DAILY 30 tablet 11  . fosinopril (MONOPRIL) 20 MG tablet Take 20 mg by mouth every morning.     . furosemide (LASIX) 40 MG tablet Take 20 mg by mouth every morning.     . lansoprazole (PREVACID) 15 MG capsule Take 15 mg by mouth daily.    . meclizine (ANTIVERT) 25 MG tablet Take 25 mg by mouth 2 (two) times daily as needed.     . metoprolol succinate (TOPROL-XL) 50 MG 24 hr tablet Take 50 mg by mouth every morning. Take with or immediately following a meal.    . potassium chloride SA (K-DUR,KLOR-CON) 20 MEQ tablet Take 20 mEq by mouth daily.    . psyllium (HYDROCIL/METAMUCIL) 95 % PACK Take 1 packet by mouth daily.    . rosuvastatin  (CRESTOR) 20 MG tablet Take 20 mg by mouth every evening.     . traMADol (ULTRAM) 50 MG tablet Take 1-2 tablets (50-100 mg total) by mouth every 6 (six) hours as needed (mild pain). 60 tablet 0   No current facility-administered medications for this visit.    Allergies  Allergen Reactions  . Morphine And Related Other (See Comments)    Reaction unknown per patient  . Sulfur Itching and Rash    History   Social History  . Marital Status: Married    Spouse Name: N/A    Number of Children: N/A  . Years of Education: N/A   Occupational History  . Not on file.   Social History Main Topics  . Smoking status: Former Smoker    Quit date: 08/23/1970  . Smokeless tobacco: Not on file  . Alcohol Use: No  . Drug Use: No  . Sexual Activity: Not on file   Other Topics Concern  . Not on file   Social History Narrative     Review of Systems: General: negative for chills, fever, night sweats or weight changes.  Cardiovascular: negative for chest pain, dyspnea on exertion, edema, orthopnea, palpitations, paroxysmal nocturnal dyspnea or shortness of breath Dermatological: negative for rash Respiratory: negative for cough or wheezing Urologic: negative for hematuria Abdominal: negative for nausea, vomiting, diarrhea, bright red blood per rectum,  melena, or hematemesis Neurologic: negative for visual changes, syncope, or dizziness All other systems reviewed and are otherwise negative except as noted above.    Blood pressure 132/80, pulse 69, height 5' 5.5" (1.664 m), weight 245 lb (111.131 kg).  General appearance: alert and no distress Neck: no adenopathy, no carotid bruit, no JVD, supple, symmetrical, trachea midline and thyroid not enlarged, symmetric, no tenderness/mass/nodules Lungs: clear to auscultation bilaterally Heart: regular rate and rhythm, S1, S2 normal, no murmur, click, rub or gallop Extremities: extremities normal, atraumatic, no cyanosis or edema  EKG normal  sinus rhythm at 69 with nonspecific IVCD/incomplete left bundle branch block with left axis deviation unchanged from prior EKGs. I personally reviewed this EKG  ASSESSMENT AND PLAN:   Coronary artery disease History of CAD status post PCI and stenting of her LAD in 3 places by myself with Taxus drug-eluting stents 03/14/10 after being admitted with unstable angina. She has a normal EF. She denies chest pain or shortness of breath.  Essential hypertension History of hypertension with blood pressure measured in the office at 132/80. She is on Monopril 20 mg a day, metoprolol succinate 50 mg a day. Continue current medications at current dosing  Dyslipidemia History of hyperlipidemia on rosuvastatin 20 mg a day.this is followed by her PCP. We'll we'll obtain a copy of her most recent blood work  Obesity (BMI 35.0-39.9 without comorbidity) She has gained 20 pounds since she was here a year ago. She does admit to dietary indiscretion and says that she goes to checks to have hamburgers and hot dogs. We talked about the importance of weight loss, diet and exercise as well as a heart healthy diet.      Lorretta Harp MD FACP,FACC,FAHA, [facility] 09/21/2014 3:39 PM

## 2014-09-21 NOTE — Assessment & Plan Note (Signed)
History of hypertension with blood pressure measured in the office at 132/80. She is on Monopril 20 mg a day, metoprolol succinate 50 mg a day. Continue current medications at current dosing

## 2014-09-21 NOTE — Patient Instructions (Signed)
Your physician wants you to follow-up in: 1 year with Dr Berry. You will receive a reminder letter in the mail two months in advance. If you don't receive a letter, please call our office to schedule the follow-up appointment.  

## 2014-10-03 ENCOUNTER — Ambulatory Visit: Payer: Medicare Other | Admitting: Cardiovascular Disease

## 2014-10-06 ENCOUNTER — Other Ambulatory Visit: Payer: Self-pay | Admitting: Cardiovascular Disease

## 2014-10-06 NOTE — Telephone Encounter (Signed)
Rx was sent to pharmacy electronically. 

## 2015-01-26 ENCOUNTER — Other Ambulatory Visit: Payer: Self-pay

## 2015-01-26 DIAGNOSIS — Z1231 Encounter for screening mammogram for malignant neoplasm of breast: Secondary | ICD-10-CM

## 2015-03-13 ENCOUNTER — Ambulatory Visit: Payer: Self-pay

## 2015-03-13 ENCOUNTER — Ambulatory Visit
Admission: RE | Admit: 2015-03-13 | Discharge: 2015-03-13 | Disposition: A | Discharge status: Home / Self Care | Payer: Medicare Other | Source: Ambulatory Visit

## 2015-03-13 DIAGNOSIS — Z1231 Encounter for screening mammogram for malignant neoplasm of breast: Secondary | ICD-10-CM

## 2015-04-06 ENCOUNTER — Encounter: Payer: Self-pay | Admitting: *Deleted

## 2015-04-13 LAB — IFOBT (OCCULT BLOOD): IFOBT: NEGATIVE

## 2015-05-16 ENCOUNTER — Encounter: Payer: Self-pay | Admitting: Cardiovascular Disease

## 2015-09-21 ENCOUNTER — Ambulatory Visit (INDEPENDENT_AMBULATORY_CARE_PROVIDER_SITE_OTHER): Payer: Medicare Other | Admitting: Cardiovascular Disease

## 2015-09-21 ENCOUNTER — Encounter: Payer: Self-pay | Admitting: Cardiovascular Disease

## 2015-09-21 VITALS — BP 138/88 | HR 61 | Ht 65.0 in | Wt 237.0 lb

## 2015-09-21 DIAGNOSIS — I2510 Atherosclerotic heart disease of native coronary artery without angina pectoris: Secondary | ICD-10-CM | POA: Diagnosis not present

## 2015-09-21 DIAGNOSIS — E785 Hyperlipidemia, unspecified: Secondary | ICD-10-CM

## 2015-09-21 DIAGNOSIS — I10 Essential (primary) hypertension: Secondary | ICD-10-CM

## 2015-09-21 DIAGNOSIS — I2583 Coronary atherosclerosis due to lipid rich plaque: Secondary | ICD-10-CM

## 2015-09-21 NOTE — Assessment & Plan Note (Signed)
History of coronary artery disease status post PCI and stenting of her LAD in 3 places by myself with Taxus drug loading stents 03/14/10 after being admitted with unstable angina. She had normal ejection fraction. She did have a 50-60% mid RCA lesion which was treated medically. She denies chest pain or shortness of breath.

## 2015-09-21 NOTE — Progress Notes (Signed)
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 I saw her 12 months ago. She has obstructive sleep apnea on CPAP which she benefits from for which she sees Dr. Ellouise Newer. She has a history of CAD status post PCI and stenting of her LAD in 3 places by myself with Taxus drug-eluting stents back on Mar 14, 2010, after being admitted with unstable angina. She had a normal EF. She did have a 50% to 60% mid RCA lesion that was treated medically. Her other problems include hypertension and hyperlipidemia. She is otherwise asymptomatic.Dr. Reynaldo Minium follows her lipid profile closely   Current Outpatient Prescriptions  Medication Sig Dispense Refill  . acetaminophen (TYLENOL) 500 MG tablet Take 1,000 mg by mouth daily as needed. For pain    . bismuth subsalicylate (PEPTO BISMOL) 262 MG/15ML suspension Take 10 mLs by mouth every 6 (six) hours as needed (upset stomach).    . cetirizine (ZYRTEC) 10 MG tablet Take 10 mg by mouth daily.    . clopidogrel (PLAVIX) 75 MG tablet Take 1 tablet (75 mg total) by mouth daily. 30 tablet 11  . fosinopril (MONOPRIL) 20 MG tablet Take 20 mg by mouth every morning.     . furosemide (LASIX) 40 MG tablet Take 20 mg by mouth every morning.     . lansoprazole (PREVACID) 15 MG capsule Take 15 mg by mouth daily.    . meclizine (ANTIVERT) 25 MG tablet Take 25 mg by mouth 2 (two) times daily as needed.     . metoprolol succinate (TOPROL-XL) 50 MG 24 hr tablet Take 50 mg by mouth every morning. Take with or immediately following a meal.    . potassium chloride SA (K-DUR,KLOR-CON) 20 MEQ tablet Take 20 mEq by mouth daily.    . psyllium (HYDROCIL/METAMUCIL) 95 %  PACK Take 1 packet by mouth daily.    . rosuvastatin (CRESTOR) 20 MG tablet Take 20 mg by mouth every evening.     . traMADol (ULTRAM) 50 MG tablet Take 1-2 tablets (50-100 mg total) by mouth every 6 (six) hours as needed (mild pain). 60 tablet 0   No current facility-administered medications for this visit.    Allergies  Allergen Reactions  . Morphine And Related Other (See Comments)    Reaction unknown per patient  . Sulfur Itching and Rash    Social History   Social History  . Marital Status: Married    Spouse Name: N/A  . Number of Children: N/A  . Years of Education: N/A   Occupational History  . Not on file.   Social History Main Topics  . Smoking status: Former Smoker    Quit date: 08/23/1970  . Smokeless tobacco: Not on file  . Alcohol Use: No  . Drug Use: No  . Sexual Activity: Not on file   Other Topics Concern  . Not on file   Social History Narrative     Review of Systems: General: negative for chills, fever, night sweats or weight changes.  Cardiovascular: negative for chest pain, dyspnea on exertion, edema, orthopnea, palpitations, paroxysmal nocturnal dyspnea or shortness of breath Dermatological: negative for rash Respiratory: negative for cough or wheezing Urologic: negative for hematuria Abdominal: negative  for nausea, vomiting, diarrhea, bright red blood per rectum, melena, or hematemesis Neurologic: negative for visual changes, syncope, or dizziness All other systems reviewed and are otherwise negative except as noted above.    Blood pressure 138/88, pulse 61, height 5\' 5"  (1.651 m), weight 237 lb (107.502 kg).  General appearance: alert and no distress Neck: no adenopathy, no carotid bruit, no JVD, supple, symmetrical, trachea midline and thyroid not enlarged, symmetric, no tenderness/mass/nodules Lungs: clear to auscultation bilaterally Heart: regular rate and rhythm, S1, S2 normal, no murmur, click, rub or gallop Extremities: extremities  normal, atraumatic, no cyanosis or edema  EKG normal sinus rhythm at 61 with left anterior fascicular block, left ventricular hypertrophy with QRS widening. I personally reviewed this EKG  ASSESSMENT AND PLAN:   Essential hypertension History of hypertension with blood pressure measured today at 138/88. She is on Monopril and metoprolol. Continue current meds at current dosing  Dyslipidemia History of hyperlipidemia on Crestor followed by her PCP  Coronary artery disease History of coronary artery disease status post PCI and stenting of her LAD in 3 places by myself with Taxus drug loading stents 03/14/10 after being admitted with unstable angina. She had normal ejection fraction. She did have a 50-60% mid RCA lesion which was treated medically. She denies chest pain or shortness of breath.      Lorretta Harp MD FACP,FACC,FAHA, [facility] 09/21/2015 11:16 AM

## 2015-09-21 NOTE — Assessment & Plan Note (Signed)
History of hypertension with blood pressure measured today at 138/88. She is on Monopril and metoprolol. Continue current meds at current dosing

## 2015-09-21 NOTE — Assessment & Plan Note (Signed)
History of hyperlipidemia on Crestor followed by her PCP 

## 2015-09-21 NOTE — Patient Instructions (Signed)

## 2015-10-18 ENCOUNTER — Other Ambulatory Visit: Payer: Self-pay | Admitting: Cardiovascular Disease

## 2015-10-18 NOTE — Telephone Encounter (Signed)
Rx has been sent to the pharmacy electronically. ° °

## 2016-04-30 ENCOUNTER — Other Ambulatory Visit: Payer: Self-pay | Admitting: Physician Assistant

## 2016-06-09 ENCOUNTER — Other Ambulatory Visit: Payer: Self-pay | Admitting: Internal Medicine

## 2016-06-09 DIAGNOSIS — Z1231 Encounter for screening mammogram for malignant neoplasm of breast: Secondary | ICD-10-CM

## 2016-07-15 ENCOUNTER — Ambulatory Visit
Admission: RE | Admit: 2016-07-15 | Discharge: 2016-07-15 | Disposition: A | Discharge status: Home / Self Care | Payer: Medicare Other | Source: Ambulatory Visit | Attending: Internal Medicine | Admitting: Internal Medicine

## 2016-07-15 DIAGNOSIS — Z1231 Encounter for screening mammogram for malignant neoplasm of breast: Secondary | ICD-10-CM

## 2016-09-23 ENCOUNTER — Encounter: Payer: Self-pay | Admitting: Cardiovascular Disease

## 2016-09-23 ENCOUNTER — Ambulatory Visit (INDEPENDENT_AMBULATORY_CARE_PROVIDER_SITE_OTHER): Payer: Medicare Other | Admitting: Cardiovascular Disease

## 2016-09-23 VITALS — BP 118/78 | HR 56 | Ht 65.0 in | Wt 231.0 lb

## 2016-09-23 DIAGNOSIS — I2510 Atherosclerotic heart disease of native coronary artery without angina pectoris: Secondary | ICD-10-CM

## 2016-09-23 DIAGNOSIS — I10 Essential (primary) hypertension: Secondary | ICD-10-CM | POA: Diagnosis not present

## 2016-09-23 DIAGNOSIS — E785 Hyperlipidemia, unspecified: Secondary | ICD-10-CM | POA: Diagnosis not present

## 2016-09-23 NOTE — Progress Notes (Signed)
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 I saw her in the office 09/21/15. She is accompanied by her husband today.. She has obstructive sleep apnea on CPAP which she benefits from for which she sees Dr. Ellouise Newer. She has a history of CAD status post PCI and stenting of her LAD in 3 places by myself with Taxus drug-eluting stents back on Mar 14, 2010, after being admitted with unstable angina. She had a normal EF. She did have a 50% to 60% mid RCA lesion that was treated medically. Her other problems include hypertension and hyperlipidemia. She is otherwise asymptomatic.Dr. Reynaldo Minium follows her lipid profile closely   Current Outpatient Prescriptions  Medication Sig Dispense Refill  . acetaminophen (TYLENOL) 500 MG tablet Take 1,000 mg by mouth daily as needed. For pain    . bismuth subsalicylate (PEPTO BISMOL) 262 MG/15ML suspension Take 10 mLs by mouth every 6 (six) hours as needed (upset stomach).    . cetirizine (ZYRTEC) 10 MG tablet Take 10 mg by mouth daily.    . clopidogrel (PLAVIX) 75 MG tablet TAKE 1 TABLET DAILY 30 tablet 11  . fosinopril (MONOPRIL) 20 MG tablet Take 20 mg by mouth every morning.     . furosemide (LASIX) 40 MG tablet Take 20 mg by mouth every morning.     . lansoprazole (PREVACID) 15 MG capsule Take 15 mg by mouth daily.    . meclizine (ANTIVERT) 25 MG tablet Take 25 mg by mouth 2 (two) times daily as needed.     . metoprolol succinate (TOPROL-XL) 50 MG 24 hr tablet Take 50 mg by mouth every morning. Take with or immediately following a meal.    . potassium chloride SA (K-DUR,KLOR-CON) 20 MEQ tablet Take 20 mEq by mouth daily.    . psyllium  (HYDROCIL/METAMUCIL) 95 % PACK Take 1 packet by mouth daily.    . rosuvastatin (CRESTOR) 20 MG tablet Take 20 mg by mouth every evening.     . traMADol (ULTRAM) 50 MG tablet Take 1-2 tablets (50-100 mg total) by mouth every 6 (six) hours as needed (mild pain). 60 tablet 0   No current facility-administered medications for this visit.     Allergies  Allergen Reactions  . Morphine And Related Other (See Comments)    Reaction unknown per patient  . Sulfur Itching and Rash    Social History   Social History  . Marital status: Married    Spouse name: N/A  . Number of children: N/A  . Years of education: N/A   Occupational History  . Not on file.   Social History Main Topics  . Smoking status: Former Smoker    Quit date: 08/23/1970  . Smokeless tobacco: Never Used  . Alcohol use No  . Drug use: No  . Sexual activity: Not on file   Other Topics Concern  . Not on file   Social History Narrative  . No narrative on file     Review of Systems: General: negative for chills, fever, night sweats or weight changes.  Cardiovascular: negative for chest pain, dyspnea on exertion, edema, orthopnea, palpitations, paroxysmal nocturnal dyspnea or shortness of breath Dermatological: negative for rash Respiratory:  negative for cough or wheezing Urologic: negative for hematuria Abdominal: negative for nausea, vomiting, diarrhea, bright red blood per rectum, melena, or hematemesis Neurologic: negative for visual changes, syncope, or dizziness All other systems reviewed and are otherwise negative except as noted above.    Blood pressure 118/78, pulse (!) 56, height 5\' 5"  (1.651 m), weight 231 lb (104.8 kg).  General appearance: alert and no distress Neck: no adenopathy, no carotid bruit, no JVD, supple, symmetrical, trachea midline and thyroid not enlarged, symmetric, no tenderness/mass/nodules Lungs: clear to auscultation bilaterally Heart: regular rate and rhythm, S1, S2 normal, no  murmur, click, rub or gallop Extremities: extremities normal, atraumatic, no cyanosis or edema  EKG sinus bradycardia 56 with QRS widening and left atrial hypertrophy. I personally reviewed his EKG.  ASSESSMENT AND PLAN:   Coronary artery disease History of CAD status post LAD stenting in 3 places by myself 03/14/2010 with Taxus drug-eluting stents. She had a normal ejection fraction. She also had a 56% mid RCA lesion treated medically. She denies chest pain or shortness of breath.  Essential hypertension History of hypertension blood pressure measured 118/78.Marland Kitchen She is on Monopril and metoprolol. Continue current meds at current dosing  Dyslipidemia History of hyperlipidemia on statin therapy followed by her PCP      Lorretta Harp MD [facility], [facility] 09/23/2016 11:28 AM

## 2016-09-23 NOTE — Assessment & Plan Note (Signed)
History of hypertension blood pressure measured 118/78.Marland Kitchen She is on Monopril and metoprolol. Continue current meds at current dosing

## 2016-09-23 NOTE — Assessment & Plan Note (Signed)
History of hyperlipidemia on statin therapy followed by her PCP. 

## 2016-09-23 NOTE — Patient Instructions (Signed)

## 2016-09-23 NOTE — Assessment & Plan Note (Signed)
History of CAD status post LAD stenting in 3 places by myself 03/14/2010 with Taxus drug-eluting stents. She had a normal ejection fraction. She also had a 56% mid RCA lesion treated medically. She denies chest pain or shortness of breath.

## 2016-10-23 ENCOUNTER — Other Ambulatory Visit: Payer: Self-pay | Admitting: Cardiovascular Disease

## 2017-08-24 ENCOUNTER — Other Ambulatory Visit: Payer: Self-pay | Admitting: Internal Medicine

## 2017-08-24 DIAGNOSIS — Z1231 Encounter for screening mammogram for malignant neoplasm of breast: Secondary | ICD-10-CM

## 2017-09-22 ENCOUNTER — Ambulatory Visit
Admission: RE | Admit: 2017-09-22 | Discharge: 2017-09-22 | Disposition: A | Discharge status: Home / Self Care | Payer: Medicare Other | Source: Ambulatory Visit | Attending: Internal Medicine | Admitting: Internal Medicine

## 2017-09-22 DIAGNOSIS — Z1231 Encounter for screening mammogram for malignant neoplasm of breast: Secondary | ICD-10-CM

## 2017-09-23 ENCOUNTER — Other Ambulatory Visit: Payer: Self-pay | Admitting: Internal Medicine

## 2017-09-23 DIAGNOSIS — R928 Other abnormal and inconclusive findings on diagnostic imaging of breast: Secondary | ICD-10-CM

## 2017-09-28 ENCOUNTER — Other Ambulatory Visit: Payer: Medicare Other

## 2017-10-01 ENCOUNTER — Other Ambulatory Visit: Payer: Self-pay | Admitting: Internal Medicine

## 2017-10-01 ENCOUNTER — Ambulatory Visit
Admission: RE | Admit: 2017-10-01 | Discharge: 2017-10-01 | Disposition: A | Discharge status: Home / Self Care | Payer: Medicare Other | Source: Ambulatory Visit | Attending: Internal Medicine | Admitting: Internal Medicine

## 2017-10-01 DIAGNOSIS — R928 Other abnormal and inconclusive findings on diagnostic imaging of breast: Secondary | ICD-10-CM

## 2017-10-01 DIAGNOSIS — N641 Fat necrosis of breast: Secondary | ICD-10-CM

## 2017-11-11 ENCOUNTER — Other Ambulatory Visit: Payer: Self-pay | Admitting: Cardiovascular Disease

## 2017-12-30 ENCOUNTER — Other Ambulatory Visit: Payer: Medicare Other

## 2017-12-31 ENCOUNTER — Other Ambulatory Visit: Payer: Self-pay | Admitting: Internal Medicine

## 2017-12-31 ENCOUNTER — Ambulatory Visit
Admission: RE | Admit: 2017-12-31 | Discharge: 2017-12-31 | Disposition: A | Discharge status: Home / Self Care | Payer: Medicare Other | Source: Ambulatory Visit | Attending: Internal Medicine | Admitting: Internal Medicine

## 2017-12-31 DIAGNOSIS — N641 Fat necrosis of breast: Secondary | ICD-10-CM

## 2018-06-18 DIAGNOSIS — M25562 Pain in left knee: Secondary | ICD-10-CM | POA: Insufficient documentation

## 2018-07-02 ENCOUNTER — Telehealth: Payer: Self-pay

## 2018-07-02 NOTE — Telephone Encounter (Signed)
   Primary Cardiologist:Jonathan Gwenlyn Found, MD last office visit, 2017  Chart reviewed as part of pre-operative protocol coverage. Because of Deborah Elliott's past medical history and time since last visit, he/she will require a follow-up visit in order to better assess preoperative cardiovascular risk.  Pre-op covering staff: - Please schedule appointment and call patient to inform them. - Please contact requesting surgeon's office via preferred method (i.e, phone, fax) to inform them of need for appointment prior to surgery.  Rosaria Ferries, PA-C  07/02/2018, 2:37 PM

## 2018-07-02 NOTE — Telephone Encounter (Signed)
   [facility] Pre-operative Risk Assessment    Request for surgical clearance:  1. What type of surgery is being performed?   LEFT TOTAL KNEE ARTHROPLASTY  2. When is this surgery scheduled?  08-16-2018   3. What type of clearance is required (medical clearance vs. Pharmacy clearance to hold med vs. Both)?  MEDICAL  4. Are there any medications that need to be held prior to surgery and how long? NONE LISTED   5. Practice name and name of physician performing surgery?   [facility] Jann[facility] Franklin, MD ATTN  [facility]   6. What is your office phone number [phone]    7.   What is your office fax number [phone]  8.   Anesthesia type (None, local, MAC, general) ?  CHOICE   Waylan Rocher 07/02/2018, 10:15 AM  _________________________________________________________________   (provider comments below)

## 2018-07-02 NOTE — Telephone Encounter (Signed)
Patient has appointment on 07/23/18, will be cleared at that appointment.

## 2018-07-20 NOTE — H&P (Signed)
TOTAL KNEE ADMISSION H&P  Patient is being admitted for left total knee arthroplasty.  Subjective:  Chief Complaint:left knee pain.  HPI: Deborah Elliott, 72 y.o. female, has a history of pain and functional disability in the left knee due to arthritis and has failed non-surgical conservative treatments for greater than 12 weeks to includeNSAID's and/or analgesics, use of assistive devices and activity modification.  Onset of symptoms was abrupt, starting 1 years ago with rapidlly worsening course since that time. The patient noted no past surgery on the left knee(s).  Patient currently rates pain in the left knee(s) at 9 out of 10 with activity. Patient has night pain, worsening of pain with activity and weight bearing, crepitus, joint swelling and instability.  Patient has evidence of moderate narrowing in the medial compartment and bone on bone change in the patellofemoral compartment with osteophyte formation along the medial femoral condyle by imaging studies. There is no active infection.  Patient Active Problem List   Diagnosis Date Noted  . Postoperative anemia due to acute blood loss 08/30/2013  . OA (osteoarthritis) of knee 08/29/2013  . Coronary artery disease 03/24/2013  . Obesity (BMI 35.0-39.9 without comorbidity) 03/24/2013  . Essential hypertension 03/24/2013  . Dyslipidemia 03/24/2013   Past Medical History:  Diagnosis Date  . Arthritis of knee   . Cancer ([place])    skin cancer  . Coronary artery disease    X3 STENTS  . Dysrhythmia    HX of tachycardia - controlled with med  . GERD (gastroesophageal reflux disease)   . Hx of skin cancer, basal cell   . Hyperlipidemia   . Hypertension   . Leg cramps   . Obesity   . Sleep apnea    uses c-pap     Past Surgical History:  Procedure Laterality Date  . APPENDECTOMY  48 YRS AGO  . NM MYOCAR PERF WALL MOTION  05/27/2010   normal  . SKIN CANCER EXCISION    . STENTED CORONARY ARTERY  03/14/2010   X3 STENTS; tandem proximal  and mid LAD  . TOTAL KNEE ARTHROPLASTY Right 08/29/2013   Procedure: RIGHT TOTAL KNEE ARTHROPLASTY;  Surgeon: Gearlean Alf, MD;  Location: [facility];  Service: Orthopedics;  Laterality: Right;    No current facility-administered medications for this encounter.    Current Outpatient Medications  Medication Sig Dispense Refill Last Dose  . acetaminophen (TYLENOL) 500 MG tablet Take 1,000 mg by mouth daily as needed. For pain   Taking  . bismuth subsalicylate (PEPTO BISMOL) 262 MG/15ML suspension Take 10 mLs by mouth every 6 (six) hours as needed (upset stomach).   Taking  . cetirizine (ZYRTEC) 10 MG tablet Take 10 mg by mouth daily.   Taking  . clopidogrel (PLAVIX) 75 MG tablet TAKE 1 TABLET DAILY 30 tablet 11   . fosinopril (MONOPRIL) 20 MG tablet Take 20 mg by mouth every morning.    Taking  . furosemide (LASIX) 40 MG tablet Take 20 mg by mouth every morning.    Taking  . lansoprazole (PREVACID) 15 MG capsule Take 15 mg by mouth daily.   Taking  . meclizine (ANTIVERT) 25 MG tablet Take 25 mg by mouth 2 (two) times daily as needed.    Taking  . metoprolol succinate (TOPROL-XL) 50 MG 24 hr tablet Take 50 mg by mouth every morning. Take with or immediately following a meal.   Taking  . potassium chloride SA (K-DUR,KLOR-CON) 20 MEQ tablet Take 20 mEq by mouth daily.  Taking  . psyllium (HYDROCIL/METAMUCIL) 95 % PACK Take 1 packet by mouth daily.   Taking  . rosuvastatin (CRESTOR) 20 MG tablet Take 20 mg by mouth every evening.    Taking  . traMADol (ULTRAM) 50 MG tablet Take 1-2 tablets (50-100 mg total) by mouth every 6 (six) hours as needed (mild pain). 60 tablet 0 Taking   Allergies  Allergen Reactions  . Morphine And Related Other (See Comments)    Reaction unknown per patient  . Sulfur Itching and Rash    Social History   Tobacco Use  . Smoking status: Former Smoker    Last attempt to quit: 08/23/1970    Years since quitting: 47.9  . Smokeless tobacco: Never Used  Substance Use  Topics  . Alcohol use: No    Family History  Problem Relation Age of Onset  . Hypertension Mother   . Diabetes Mother   . Stroke Mother   . Aneurysm Father   . Cancer Maternal Grandmother      Review of Systems  Constitutional: Negative for chills and fever.  HENT: Negative for congestion, sore throat and tinnitus.   Eyes: Negative for double vision, photophobia and pain.  Respiratory: Negative for cough, shortness of breath and wheezing.   Cardiovascular: Negative for chest pain, palpitations and orthopnea.  Gastrointestinal: Negative for heartburn, nausea and vomiting.  Genitourinary: Negative for dysuria, frequency and urgency.  Musculoskeletal: Positive for joint pain.  Neurological: Negative for dizziness, weakness and headaches.    Objective:  Physical Exam  Obese and well developed. General: Alert and oriented x3, cooperative and pleasant, no acute distress. Head: normocephalic, atraumatic, neck supple. Eyes: EOMI. Respiratory: breath sounds clear in all fields, no wheezing, rales, or rhonchi. Cardiovascular: Regular rate and rhythm, no murmurs, gallops or rubs.  Abdomen: non-tender to palpation and soft, normoactive bowel sounds. Musculoskeletal: Left Knee Exam: Moderate tenderness to palpation about the medial joint line of the left knee. ROM left knee 0-125 degrees. No effusion noted in the left knee. No instability about the left knee. Mild varus deformity. Moderate patellofemoral crepitus in the left knee. Calves soft and nontender. Motor function intact in LE. Strength 5/5 LE bilaterally. Neuro: Distal pulses 2+. Sensation to light touch intact in LE.  Vital signs in last 24 hours: Blood pressure: 142/86 mmHg Pulse: 64 bpm   Labs:   Estimated body mass index is 38.44 kg/m as calculated from the following:   Height as of 09/23/16: 5\' 5"  (1.651 m).   Weight as of 09/23/16: 104.8 kg.   Imaging Review Plain radiographs demonstrate severe degenerative joint  disease of the left knee(s). The overall alignment ismild varus. The bone quality appears to be adequate for age and reported activity level.   Preoperative templating of the joint replacement has been completed, documented, and submitted to the Operating Room personnel in order to optimize intra-operative equipment management.   Anticipated LOS equal to or greater than 2 midnights due to - Age 36 and older with one or more of the following:  - Obesity  - Expected need for hospital services (PT, OT, Nursing) required for safe  discharge  - Anticipated need for postoperative skilled nursing care or inpatient rehab  - Active co-morbidities: Diabetes and Coronary Artery Disease OR   - Unanticipated findings during/Post Surgery: None  - Patient is a high risk of re-admission due to: None     Assessment/Plan:  End stage arthritis, left knee   The patient history, physical examination, clinical judgment  of the provider and imaging studies are consistent with end stage degenerative joint disease of the left knee(s) and total knee arthroplasty is deemed medically necessary. The treatment options including medical management, injection therapy arthroscopy and arthroplasty were discussed at length. The risks and benefits of total knee arthroplasty were presented and reviewed. The risks due to aseptic loosening, infection, stiffness, patella tracking problems, thromboembolic complications and other imponderables were discussed. The patient acknowledged the explanation, agreed to proceed with the plan and consent was signed. Patient is being admitted for inpatient treatment for surgery, pain control, PT, OT, prophylactic antibiotics, VTE prophylaxis, progressive ambulation and ADL's and discharge planning. The patient is planning to be discharged home with outpatient physical therapy.   Therapy Plans: outpatient therapy at [facility] in [place] Disposition: Home with husband Planned DVT  Prophylaxis: Plavix (pt takes due to cardiac stent) DME needed: 3-n-1 PCP: Dr. Reynaldo Minium Cardiologist: Dr. Gwenlyn Found TXA: Topical (patient has 3 cardiac stents) Allergies: Sulfa, adhesive tape Anesthesia Concerns: None HgbA1c: 6.3%  - Patient was instructed on what medications to stop prior to surgery. - Follow-up visit in 2 weeks with Dr. Wynelle Link - Begin physical therapy following surgery - Pre-operative lab work as pre-surgical testing - Prescriptions will be provided in hospital at time of discharge  Theresa Duty, PA-C Orthopedic Surgery [facility] [place]

## 2018-07-23 ENCOUNTER — Ambulatory Visit: Payer: Medicare Other | Admitting: Cardiovascular Disease

## 2018-07-23 ENCOUNTER — Encounter: Payer: Self-pay | Admitting: Cardiovascular Disease

## 2018-07-23 VITALS — BP 133/76 | HR 61 | Ht 64.0 in | Wt 230.2 lb

## 2018-07-23 DIAGNOSIS — I10 Essential (primary) hypertension: Secondary | ICD-10-CM

## 2018-07-23 DIAGNOSIS — E785 Hyperlipidemia, unspecified: Secondary | ICD-10-CM

## 2018-07-23 DIAGNOSIS — I2510 Atherosclerotic heart disease of native coronary artery without angina pectoris: Secondary | ICD-10-CM

## 2018-07-23 NOTE — Assessment & Plan Note (Signed)
History of essential hypertension her blood pressure measured at 133/76.  She is on Monopril and metoprolol.  Continue current meds at current dosing

## 2018-07-23 NOTE — Assessment & Plan Note (Signed)
History of dyslipidemia on statin therapy with lipid profile performed 04/21/2018 revealing total cholesterol 171, LDL of 108 and HDL 34.

## 2018-07-23 NOTE — Patient Instructions (Signed)

## 2018-07-23 NOTE — Assessment & Plan Note (Signed)
History of CAD status post PCI and drug-eluting stenting of her LAD by myself 03/14/2010 with 3 stents placed.  EF was normal at that time.  She did have a 50 to 60% mid RCA lesion which was treated medically.  She denies chest pain.

## 2018-07-23 NOTE — Progress Notes (Signed)
07/23/2018 Deborah Elliott   13-Oct-1946  [id number]  Primary Physician Burnard Bunting, MD Primary Cardiologist: Lorretta Harp MD [name], [place]  HPI:  Deborah Elliott is a 72 y.o.  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 I saw her in the office 09/23/2016. She is accompanied by her husband today.. She has obstructive sleep apnea on CPAP which she benefits from for which she sees Dr. Ellouise Newer. She has a history of CAD status post PCI and stenting of her LAD in 3 places by myself with Taxus drug-eluting stents back on Mar 14, 2010, after being admitted with unstable angina. She had a normal EF. She did have a 50% to 60% mid RCA lesion that was treated medically. Her other problems include hypertension and hyperlipidemia. She is otherwise asymptomatic.Dr. Reynaldo Minium follows her lipid profile closely Since I saw her 2 years ago she apparently needs a left total knee replacement scheduled for 08/08/2018 by Dr. Maureen Ralphs.  Given her lack of symptoms and the relative low risk of the procedure I am clearing her for this at low risk.  Current Meds  Medication Sig  . acetaminophen (TYLENOL) 500 MG tablet Take 1,000 mg by mouth daily as needed. For pain  . bismuth subsalicylate (PEPTO BISMOL) 262 MG/15ML suspension Take 10 mLs by mouth every 6 (six) hours as needed (upset stomach).  . clopidogrel (PLAVIX) 75 MG tablet TAKE 1 TABLET DAILY  . fosinopril (MONOPRIL) 20 MG tablet Take 20 mg by mouth every morning.   . furosemide (LASIX) 40 MG tablet Take 20 mg by mouth every morning.   . lansoprazole (PREVACID) 15 MG capsule Take 15 mg by mouth daily.  . meclizine (ANTIVERT) 25 MG tablet Take 25 mg by mouth 2 (two) times daily as needed.   . metoprolol succinate (TOPROL-XL) 50 MG 24 hr tablet Take 50 mg by mouth every morning. Take with or immediately following a meal.  .  potassium chloride SA (K-DUR,KLOR-CON) 20 MEQ tablet Take 20 mEq by mouth daily.  . psyllium (HYDROCIL/METAMUCIL) 95 % PACK Take 1 packet by mouth daily.  . rosuvastatin (CRESTOR) 20 MG tablet Take 20 mg by mouth every evening.      Allergies  Allergen Reactions  . Morphine And Related Other (See Comments)    Reaction unknown per patient  . Sulfur Itching and Rash    Social History   Socioeconomic History  . Marital status: Married    Spouse name: Not on file  . Number of children: Not on file  . Years of education: Not on file  . Highest education level: Not on file  Occupational History  . Not on file  Social Needs  . Financial resource strain: Not on file  . Food insecurity:    Worry: Not on file    Inability: Not on file  . Transportation needs:    Medical: Not on file    Non-medical: Not on file  Tobacco Use  . Smoking status: Former Smoker    Last attempt to quit: 08/23/1970    Years since quitting: 47.9  . Smokeless tobacco: Never Used  Substance and Sexual Activity  . Alcohol use: No  . Drug use: No  . Sexual activity: Not on file  Lifestyle  . Physical activity:    Days per week: Not on file    Minutes per session: Not  on file  . Stress: Not on file  Relationships  . Social connections:    Talks on phone: Not on file    Gets together: Not on file    Attends religious service: Not on file    Active member of club or organization: Not on file    Attends meetings of clubs or organizations: Not on file    Relationship status: Not on file  . Intimate partner violence:    Fear of current or ex partner: Not on file    Emotionally abused: Not on file    Physically abused: Not on file    Forced sexual activity: Not on file  Other Topics Concern  . Not on file  Social History Narrative  . Not on file     Review of Systems: General: negative for chills, fever, night sweats or weight changes.  Cardiovascular: negative for chest pain, dyspnea on exertion,  edema, orthopnea, palpitations, paroxysmal nocturnal dyspnea or shortness of breath Dermatological: negative for rash Respiratory: negative for cough or wheezing Urologic: negative for hematuria Abdominal: negative for nausea, vomiting, diarrhea, bright red blood per rectum, melena, or hematemesis Neurologic: negative for visual changes, syncope, or dizziness All other systems reviewed and are otherwise negative except as noted above.    Blood pressure 133/76, pulse 61, height 5\' 4"  (1.626 m), weight 230 lb 3.2 oz (104.4 kg).  General appearance: alert and no distress Neck: no adenopathy, no carotid bruit, no JVD, supple, symmetrical, trachea midline and thyroid not enlarged, symmetric, no tenderness/mass/nodules Lungs: clear to auscultation bilaterally Heart: regular rate and rhythm, S1, S2 normal, no murmur, click, rub or gallop Extremities: extremities normal, atraumatic, no cyanosis or edema Pulses: 2+ and symmetric Skin: Skin color, texture, turgor normal. No rashes or lesions Neurologic: Alert and oriented X 3, normal strength and tone. Normal symmetric reflexes. Normal coordination and gait  EKG sinus rhythm at 61 with evidence of LVH with repolarization changes, poor R wave progression and QRS widening unchanged from her last EKG.  ASSESSMENT AND PLAN:   Coronary artery disease History of CAD status post PCI and drug-eluting stenting of her LAD by myself 03/14/2010 with 3 stents placed.  EF was normal at that time.  She did have a 50 to 60% mid RCA lesion which was treated medically.  She denies chest pain.  Essential hypertension History of essential hypertension her blood pressure measured at 133/76.  She is on Monopril and metoprolol.  Continue current meds at current dosing  Dyslipidemia History of dyslipidemia on statin therapy with lipid profile performed 04/21/2018 revealing total cholesterol 171, LDL of 108 and HDL 34.      Lorretta Harp MD [facility],  [facility] 07/23/2018 11:59 AM

## 2018-08-09 ENCOUNTER — Other Ambulatory Visit: Payer: Self-pay

## 2018-08-09 ENCOUNTER — Encounter (HOSPITAL_COMMUNITY)
Admission: RE | Admit: 2018-08-09 | Discharge: 2018-08-09 | Disposition: A | Discharge status: Home / Self Care | Payer: Medicare Other | Source: Ambulatory Visit | Attending: Orthopedic Surgery | Admitting: Orthopedic Surgery

## 2018-08-09 ENCOUNTER — Encounter (HOSPITAL_COMMUNITY): Payer: Self-pay

## 2018-08-09 DIAGNOSIS — Z01812 Encounter for preprocedural laboratory examination: Secondary | ICD-10-CM | POA: Insufficient documentation

## 2018-08-09 DIAGNOSIS — M1712 Unilateral primary osteoarthritis, left knee: Secondary | ICD-10-CM | POA: Diagnosis not present

## 2018-08-09 HISTORY — DX: Nausea with vomiting, unspecified: R11.2

## 2018-08-09 HISTORY — DX: Other specified postprocedural states: Z98.890

## 2018-08-09 HISTORY — DX: Strain of left Achilles tendon, initial encounter: S86.012A

## 2018-08-09 HISTORY — DX: Type 2 diabetes mellitus without complications: E11.9

## 2018-08-09 HISTORY — DX: Dyspnea, unspecified: R06.00

## 2018-08-09 HISTORY — DX: Dizziness and giddiness: R42

## 2018-08-09 LAB — SURGICAL PCR SCREEN
MRSA, PCR: NEGATIVE
Staphylococcus aureus: NEGATIVE

## 2018-08-09 LAB — CBC
HCT: 43.7 % (ref 36.0–46.0)
Hemoglobin: 14.1 g/dL (ref 12.0–15.0)
MCH: 29.7 pg (ref 26.0–34.0)
MCHC: 32.3 g/dL (ref 30.0–36.0)
MCV: 92 fL (ref 80.0–100.0)
Platelets: 172 10*3/uL (ref 150–400)
RBC: 4.75 MIL/uL (ref 3.87–5.11)
RDW: 13.2 % (ref 11.5–15.5)
WBC: 6 10*3/uL (ref 4.0–10.5)
nRBC: 0 % (ref 0.0–0.2)

## 2018-08-09 LAB — PROTIME-INR
INR: 0.99
Prothrombin Time: 13 seconds (ref 11.4–15.2)

## 2018-08-09 LAB — COMPREHENSIVE METABOLIC PANEL
ALT: 17 U/L (ref 0–44)
AST: 32 U/L (ref 15–41)
Albumin: 4 g/dL (ref 3.5–5.0)
Alkaline Phosphatase: 49 U/L (ref 38–126)
Anion gap: 7 (ref 5–15)
BUN: 11 mg/dL (ref 8–23)
CO2: 26 mmol/L (ref 22–32)
Calcium: 9.5 mg/dL (ref 8.9–10.3)
Chloride: 110 mmol/L (ref 98–111)
Creatinine, Ser: 0.63 mg/dL (ref 0.44–1.00)
GFR calc Af Amer: >60 mL/min (ref >60)
GFR calc non Af Amer: >60 mL/min (ref >60)
Glucose, Bld: 116 mg/dL — ABNORMAL HIGH (ref 70–99)
Potassium: 5.3 mmol/L — ABNORMAL HIGH (ref 3.5–5.1)
Sodium: 143 mmol/L (ref 135–145)
Total Bilirubin: 0.8 mg/dL (ref 0.3–1.2)
Total Protein: 6.9 g/dL (ref 6.5–8.1)

## 2018-08-09 LAB — APTT: aPTT: 34 seconds (ref 24–36)

## 2018-08-09 LAB — GLUCOSE, CAPILLARY: Glucose-Capillary: 109 mg/dL — ABNORMAL HIGH (ref 70–99)

## 2018-08-09 NOTE — Patient Instructions (Signed)
Your procedure is scheduled on: Monday, Oct. 28, 2019   Surgery Time:  2:50PM-3:40PM   Report to [place]  Entrance    Report to admitting at 12:15 PM   Call this number if you have problems the morning of surgery [phone]   Bring CPAP mask and tubing day of surgery   Do not eat food:After Midnight.   May have liquids until 8:30AM morning of surgery  CLEAR LIQUID DIET  Foods Allowed                                                                     Foods Excluded  Water, Coffee and tea, regular and decaf                             liquids that you cannot  Plain Jell-O in any flavor                                             see through such as: Fruit ices (not with fruit pulp)                                     milk, soups, orange juice  Iced Popsicles                                    All solid food Carbonated beverages, regular and diet                                    Cranberry, grape and apple juices Sports drinks like Gatorade Lightly seasoned clear broth or consume(fat free) Sugar, honey syrup  Sample Menu Breakfast                                Lunch                                     Supper Cranberry juice                    Beef broth                            Chicken broth Jell-O                                     Grape juice                           Apple juice Coffee or tea  Jell-O                                      Popsicle                                                Coffee or tea                        Coffee or tea   Brush your teeth the morning of surgery.   Do NOT smoke after Midnight   Take these medicines the morning of surgery with A SIP OF WATER: Metoprolol, Nexium  DO NOT TAKE ANY DIABETIC MEDICATIONS DAY OF YOUR SURGERY                               You may not have any metal on your body including hair pins, jewelry, and body piercings             Do not wear make-up, lotions,  powders, perfumes/cologne, or deodorant             Do not wear nail polish.  Do not shave  48 hours prior to surgery.       Do not bring valuables to the hospital. [place].   Contacts, dentures or bridgework may not be worn into surgery.   Leave suitcase in the car. After surgery it may be brought to your room.   Special Instructions: Bring a copy of your healthcare power of attorney and living will documents         the day of surgery if you haven't scanned them in before.              Please read over the following fact sheets you were given:  [facility] - Preparing for Surgery Before surgery, you can play an important role.  Because skin is not sterile, your skin needs to be as free of germs as possible.  You can reduce the number of germs on your skin by washing with CHG (chlorahexidine gluconate) soap before surgery.  CHG is an antiseptic cleaner which kills germs and bonds with the skin to continue killing germs even after washing. Please DO NOT use if you have an allergy to CHG or antibacterial soaps.  If your skin becomes reddened/irritated stop using the CHG and inform your nurse when you arrive at Short Stay. Do not shave (including legs and underarms) for at least 48 hours prior to the first CHG shower.  You may shave your face/neck.  Please follow these instructions carefully:  1.  Shower with CHG Soap the night before surgery and the  morning of surgery.  2.  If you choose to wash your hair, wash your hair first as usual with your normal  shampoo.  3.  After you shampoo, rinse your hair and body thoroughly to remove the shampoo.                             4.  Use CHG as you would  any other liquid soap.  You can apply chg directly to the skin and wash.  Gently with a scrungie or clean washcloth.  5.  Apply the CHG Soap to your body ONLY FROM THE NECK DOWN.   Do   not use on face/ open                           Wound or open  sores. Avoid contact with eyes, ears mouth and   genitals (private parts).                       Wash face,  Genitals (private parts) with your normal soap.             6.  Wash thoroughly, paying special attention to the area where your    surgery  will be performed.  7.  Thoroughly rinse your body with warm water from the neck down.  8.  DO NOT shower/wash with your normal soap after using and rinsing off the CHG Soap.                9.  Pat yourself dry with a clean towel.            10.  Wear clean pajamas.            11.  Place clean sheets on your bed the night of your first shower and do not  sleep with pets. Day of Surgery : Do not apply any lotions/deodorants the morning of surgery.  Please wear clean clothes to the hospital/surgery center.  FAILURE TO FOLLOW THESE INSTRUCTIONS MAY RESULT IN THE CANCELLATION OF YOUR SURGERY  PATIENT SIGNATURE_________________________________  NURSE SIGNATURE__________________________________  ________________________________________________________________________   Deborah Elliott  An incentive spirometer is a tool that can help keep your lungs clear and active. This tool measures how well you are filling your lungs with each breath. Taking long deep breaths may help reverse or decrease the chance of developing breathing (pulmonary) problems (especially infection) following:  A long period of time when you are unable to move or be active. BEFORE THE PROCEDURE   If the spirometer includes an indicator to show your best effort, your nurse or respiratory therapist will set it to a desired goal.  If possible, sit up straight or lean slightly forward. Try not to slouch.  Hold the incentive spirometer in an upright position. INSTRUCTIONS FOR USE  1. Sit on the edge of your bed if possible, or sit up as far as you can in bed or on a chair. 2. Hold the incentive spirometer in an upright position. 3. Breathe out normally. 4. Place the  mouthpiece in your mouth and seal your lips tightly around it. 5. Breathe in slowly and as deeply as possible, raising the piston or the ball toward the top of the column. 6. Hold your breath for 3-5 seconds or for as long as possible. Allow the piston or ball to fall to the bottom of the column. 7. Remove the mouthpiece from your mouth and breathe out normally. 8. Rest for a few seconds and repeat Steps 1 through 7 at least 10 times every 1-2 hours when you are awake. Take your time and take a few normal breaths between deep breaths. 9. The spirometer may include an indicator to show your best effort. Use the indicator as a goal to work toward during each repetition. 10. After each set of 10  deep breaths, practice coughing to be sure your lungs are clear. If you have an incision (the cut made at the time of surgery), support your incision when coughing by placing a pillow or rolled up towels firmly against it. Once you are able to get out of bed, walk around indoors and cough well. You may stop using the incentive spirometer when instructed by your caregiver.  RISKS AND COMPLICATIONS  Take your time so you do not get dizzy or light-headed.  If you are in pain, you may need to take or ask for pain medication before doing incentive spirometry. It is harder to take a deep breath if you are having pain. AFTER USE  Rest and breathe slowly and easily.  It can be helpful to keep track of a log of your progress. Your caregiver can provide you with a simple table to help with this. If you are using the spirometer at home, follow these instructions: [place] IF:   You are having difficultly using the spirometer.  You have trouble using the spirometer as often as instructed.  Your pain medication is not giving enough relief while using the spirometer.  You develop fever of 100.5 F (38.1 C) or higher. SEEK IMMEDIATE MEDICAL CARE IF:   You cough up bloody sputum that had not been present  before.  You develop fever of 102 F (38.9 C) or greater.  You develop worsening pain at or near the incision site. MAKE SURE YOU:   Understand these instructions.  Will watch your condition.  Will get help right away if you are not doing well or get worse. Document Released: 02/16/2007 Document Revised: 12/29/2011 Document Reviewed: 04/19/2007 Exit[employer] Patient Information 2014 Exit[employer], [place].   ________________________________________________________________________  WHAT IS A BLOOD TRANSFUSION? Blood Transfusion Information  A transfusion is the replacement of blood or some of its parts. Blood is made up of multiple cells which provide different functions.  Red blood cells carry oxygen and are used for blood loss replacement.  White blood cells fight against infection.  Platelets control bleeding.  Plasma helps clot blood.  Other blood products are available for specialized needs, such as hemophilia or other clotting disorders. BEFORE THE TRANSFUSION  Who gives blood for transfusions?   Healthy volunteers who are fully evaluated to make sure their blood is safe. This is blood bank blood. Transfusion therapy is the safest it has ever been in the practice of medicine. Before blood is taken from a donor, a complete history is taken to make sure that person has no history of diseases nor engages in risky social behavior (examples are intravenous drug use or sexual activity with multiple partners). The donor's travel history is screened to minimize risk of transmitting infections, such as malaria. The donated blood is tested for signs of infectious diseases, such as HIV and hepatitis. The blood is then tested to be sure it is compatible with you in order to minimize the chance of a transfusion reaction. If you or a relative donates blood, this is often done in anticipation of surgery and is not appropriate for emergency situations. It takes many days to process the donated  blood. RISKS AND COMPLICATIONS Although transfusion therapy is very safe and saves many lives, the main dangers of transfusion include:   Getting an infectious disease.  Developing a transfusion reaction. This is an allergic reaction to something in the blood you were given. Every precaution is taken to prevent this. The decision to have a blood transfusion  has been considered carefully by your caregiver before blood is given. Blood is not given unless the benefits outweigh the risks. AFTER THE TRANSFUSION  Right after receiving a blood transfusion, you will usually feel much better and more energetic. This is especially true if your red blood cells have gotten low (anemic). The transfusion raises the level of the red blood cells which carry oxygen, and this usually causes an energy increase.  The nurse administering the transfusion will monitor you carefully for complications. HOME CARE INSTRUCTIONS  No special instructions are needed after a transfusion. You may find your energy is better. Speak with your caregiver about any limitations on activity for underlying diseases you may have. SEEK MEDICAL CARE IF:   Your condition is not improving after your transfusion.  You develop redness or irritation at the intravenous (IV) site. SEEK IMMEDIATE MEDICAL CARE IF:  Any of the following symptoms occur over the next 12 hours:  Shaking chills.  You have a temperature by mouth above 102 F (38.9 C), not controlled by medicine.  Chest, back, or muscle pain.  People around you feel you are not acting correctly or are confused.  Shortness of breath or difficulty breathing.  Dizziness and fainting.  You get a rash or develop hives.  You have a decrease in urine output.  Your urine turns a dark color or changes to pink, red, or brown. Any of the following symptoms occur over the next 10 days:  You have a temperature by mouth above 102 F (38.9 C), not controlled by  medicine.  Shortness of breath.  Weakness after normal activity.  The white part of the eye turns yellow (jaundice).  You have a decrease in the amount of urine or are urinating less often.  Your urine turns a dark color or changes to pink, red, or brown. Document Released: 10/03/2000 Document Revised: 12/29/2011 Document Reviewed: 05/22/2008 [facility] Patient Information 2014 [place], [place].  _______________________________________________________________________

## 2018-08-09 NOTE — Pre-Procedure Instructions (Signed)
CMP results 08/09/2018 sent to Dr. Wynelle Link via epic.

## 2018-08-09 NOTE — Pre-Procedure Instructions (Signed)
The following are in epic: Last office visit note and cardiac clearance Dr. Gwenlyn Found 07/23/2018 EKG 07/23/2018

## 2018-08-10 LAB — HEMOGLOBIN A1C
Hgb A1c MFr Bld: 6.2 % — ABNORMAL HIGH (ref 4.8–5.6)
Mean Plasma Glucose: 131 mg/dL

## 2018-08-10 NOTE — Pre-Procedure Instructions (Signed)
Hgb A 1 C results 08/09/2018 sent to Dr. Wynelle Link  via epic.

## 2018-08-15 MED ORDER — TRANEXAMIC ACID 1000 MG/10ML IV SOLN
2000.0000 mg | INTRAVENOUS | Status: DC
  Filled 2018-08-15: qty 20

## 2018-08-15 MED ORDER — BUPIVACAINE LIPOSOME 1.3 % IJ SUSP
20.0000 mL | Freq: Once | INTRAMUSCULAR | Status: DC
  Filled 2018-08-15: qty 20

## 2018-08-16 ENCOUNTER — Inpatient Hospital Stay (HOSPITAL_COMMUNITY): Payer: Medicare Other | Admitting: Anesthesiology

## 2018-08-16 ENCOUNTER — Encounter (HOSPITAL_COMMUNITY): Payer: Self-pay | Admitting: Emergency Medicine

## 2018-08-16 ENCOUNTER — Inpatient Hospital Stay (HOSPITAL_COMMUNITY)
Admission: RE | Admit: 2018-08-16 | Discharge: 2018-08-20 | DRG: 470 | Disposition: A | Discharge status: Skilled Nursing Facility | Payer: Medicare Other | Attending: Student | Admitting: Student

## 2018-08-16 ENCOUNTER — Other Ambulatory Visit: Payer: Self-pay

## 2018-08-16 ENCOUNTER — Encounter (HOSPITAL_COMMUNITY)
Admission: RE | Disposition: A | Discharge status: Skilled Nursing Facility | Payer: Self-pay | Source: Home / Self Care | Attending: Student

## 2018-08-16 DIAGNOSIS — Z79891 Long term (current) use of opiate analgesic: Secondary | ICD-10-CM

## 2018-08-16 DIAGNOSIS — G4730 Sleep apnea, unspecified: Secondary | ICD-10-CM | POA: Diagnosis present

## 2018-08-16 DIAGNOSIS — Z833 Family history of diabetes mellitus: Secondary | ICD-10-CM

## 2018-08-16 DIAGNOSIS — Z85828 Personal history of other malignant neoplasm of skin: Secondary | ICD-10-CM

## 2018-08-16 DIAGNOSIS — Z955 Presence of coronary angioplasty implant and graft: Secondary | ICD-10-CM

## 2018-08-16 DIAGNOSIS — Z6839 Body mass index (BMI) 39.0-39.9, adult: Secondary | ICD-10-CM

## 2018-08-16 DIAGNOSIS — M1712 Unilateral primary osteoarthritis, left knee: Principal | ICD-10-CM | POA: Diagnosis present

## 2018-08-16 DIAGNOSIS — I2510 Atherosclerotic heart disease of native coronary artery without angina pectoris: Secondary | ICD-10-CM | POA: Diagnosis present

## 2018-08-16 DIAGNOSIS — D62 Acute posthemorrhagic anemia: Secondary | ICD-10-CM | POA: Diagnosis not present

## 2018-08-16 DIAGNOSIS — Z96651 Presence of right artificial knee joint: Secondary | ICD-10-CM | POA: Diagnosis present

## 2018-08-16 DIAGNOSIS — K219 Gastro-esophageal reflux disease without esophagitis: Secondary | ICD-10-CM | POA: Diagnosis present

## 2018-08-16 DIAGNOSIS — E669 Obesity, unspecified: Secondary | ICD-10-CM | POA: Diagnosis present

## 2018-08-16 DIAGNOSIS — E119 Type 2 diabetes mellitus without complications: Secondary | ICD-10-CM | POA: Diagnosis present

## 2018-08-16 DIAGNOSIS — Z91048 Other nonmedicinal substance allergy status: Secondary | ICD-10-CM

## 2018-08-16 DIAGNOSIS — Z809 Family history of malignant neoplasm, unspecified: Secondary | ICD-10-CM

## 2018-08-16 DIAGNOSIS — I10 Essential (primary) hypertension: Secondary | ICD-10-CM | POA: Diagnosis present

## 2018-08-16 DIAGNOSIS — M25762 Osteophyte, left knee: Secondary | ICD-10-CM | POA: Diagnosis present

## 2018-08-16 DIAGNOSIS — Z8249 Family history of ischemic heart disease and other diseases of the circulatory system: Secondary | ICD-10-CM

## 2018-08-16 DIAGNOSIS — M179 Osteoarthritis of knee, unspecified: Secondary | ICD-10-CM | POA: Diagnosis present

## 2018-08-16 DIAGNOSIS — M1710 Unilateral primary osteoarthritis, unspecified knee: Secondary | ICD-10-CM | POA: Diagnosis present

## 2018-08-16 DIAGNOSIS — E785 Hyperlipidemia, unspecified: Secondary | ICD-10-CM | POA: Diagnosis present

## 2018-08-16 DIAGNOSIS — Z882 Allergy status to sulfonamides status: Secondary | ICD-10-CM

## 2018-08-16 DIAGNOSIS — Z885 Allergy status to narcotic agent status: Secondary | ICD-10-CM

## 2018-08-16 DIAGNOSIS — Z7902 Long term (current) use of antithrombotics/antiplatelets: Secondary | ICD-10-CM

## 2018-08-16 DIAGNOSIS — Z87891 Personal history of nicotine dependence: Secondary | ICD-10-CM

## 2018-08-16 DIAGNOSIS — Z79899 Other long term (current) drug therapy: Secondary | ICD-10-CM

## 2018-08-16 HISTORY — PX: TOTAL KNEE ARTHROPLASTY: SHX125

## 2018-08-16 LAB — POCT I-STAT 4, (NA,K, GLUC, HGB,HCT)
Glucose, Bld: 114 mg/dL — ABNORMAL HIGH (ref 70–99)
HCT: 39 % (ref 36.0–46.0)
Hemoglobin: 13.3 g/dL (ref 12.0–15.0)
Potassium: 4.1 mmol/L (ref 3.5–5.1)
Sodium: 141 mmol/L (ref 135–145)

## 2018-08-16 LAB — TYPE AND SCREEN
ABO/RH(D): O NEG
Antibody Screen: NEGATIVE

## 2018-08-16 LAB — GLUCOSE, CAPILLARY
Glucose-Capillary: 120 mg/dL — ABNORMAL HIGH (ref 70–99)
Glucose-Capillary: 113 mg/dL — ABNORMAL HIGH (ref 70–99)

## 2018-08-16 SURGERY — ARTHROPLASTY, KNEE, TOTAL
Anesthesia: Spinal | Site: Knee | Laterality: Left

## 2018-08-16 MED ORDER — METOCLOPRAMIDE HCL 5 MG PO TABS: 5.0000 mg | ORAL_TABLET | Freq: Three times a day (TID) | ORAL | Status: DC | PRN

## 2018-08-16 MED ORDER — SODIUM CHLORIDE 0.9 % IJ SOLN
INTRAMUSCULAR | Status: AC
  Filled 2018-08-16: qty 50

## 2018-08-16 MED ORDER — STERILE WATER FOR IRRIGATION IR SOLN
Status: DC | PRN
  Administered 2018-08-16: 2000 mL

## 2018-08-16 MED ORDER — GABAPENTIN 300 MG PO CAPS
300.0000 mg | ORAL_CAPSULE | Freq: Once | ORAL | Status: AC
  Administered 2018-08-16: 300 mg via ORAL
  Filled 2018-08-16: qty 1

## 2018-08-16 MED ORDER — METHOCARBAMOL 500 MG IVPB - SIMPLE MED
500.0000 mg | Freq: Four times a day (QID) | INTRAVENOUS | Status: DC | PRN
  Administered 2018-08-16: 500 mg via INTRAVENOUS
  Filled 2018-08-16: qty 50

## 2018-08-16 MED ORDER — CEFAZOLIN SODIUM-DEXTROSE 2-4 GM/100ML-% IV SOLN
2.0000 g | Freq: Four times a day (QID) | INTRAVENOUS | Status: AC
  Administered 2018-08-16 – 2018-08-17 (×2): 2 g via INTRAVENOUS
  Filled 2018-08-16 (×2): qty 100

## 2018-08-16 MED ORDER — PROPOFOL 10 MG/ML IV BOLUS
INTRAVENOUS | Status: AC
  Filled 2018-08-16: qty 40

## 2018-08-16 MED ORDER — DEXAMETHASONE SODIUM PHOSPHATE 10 MG/ML IJ SOLN
8.0000 mg | Freq: Once | INTRAMUSCULAR | Status: AC
  Administered 2018-08-16: 8 mg via INTRAVENOUS

## 2018-08-16 MED ORDER — SODIUM CHLORIDE 0.9 % IJ SOLN
INTRAMUSCULAR | Status: DC | PRN
  Administered 2018-08-16: 60 mL via INTRAVENOUS

## 2018-08-16 MED ORDER — SODIUM CHLORIDE 0.9 % IJ SOLN
INTRAMUSCULAR | Status: AC
  Filled 2018-08-16: qty 10

## 2018-08-16 MED ORDER — HYDROMORPHONE HCL 1 MG/ML IJ SOLN: 0.2500 mg | INTRAMUSCULAR | Status: DC | PRN

## 2018-08-16 MED ORDER — METOPROLOL SUCCINATE ER 50 MG PO TB24
50.0000 mg | ORAL_TABLET | Freq: Every day | ORAL | Status: DC
  Administered 2018-08-17 – 2018-08-20 (×4): 50 mg via ORAL
  Filled 2018-08-16 (×4): qty 1

## 2018-08-16 MED ORDER — ACETAMINOPHEN 500 MG PO TABS
1000.0000 mg | ORAL_TABLET | Freq: Four times a day (QID) | ORAL | Status: AC
  Administered 2018-08-16 – 2018-08-17 (×3): 1000 mg via ORAL
  Filled 2018-08-16 (×4): qty 2

## 2018-08-16 MED ORDER — CLONIDINE HCL (ANALGESIA) 100 MCG/ML EP SOLN
EPIDURAL | Status: DC | PRN
  Administered 2018-08-16: 50 ug

## 2018-08-16 MED ORDER — MEPERIDINE HCL 50 MG/ML IJ SOLN: 6.2500 mg | INTRAMUSCULAR | Status: DC | PRN

## 2018-08-16 MED ORDER — ONDANSETRON HCL 4 MG/2ML IJ SOLN: 4.0000 mg | Freq: Four times a day (QID) | INTRAMUSCULAR | Status: DC | PRN

## 2018-08-16 MED ORDER — FENTANYL CITRATE (PF) 100 MCG/2ML IJ SOLN
50.0000 ug | INTRAMUSCULAR | Status: DC
  Administered 2018-08-16: 50 ug via INTRAVENOUS
  Filled 2018-08-16: qty 2

## 2018-08-16 MED ORDER — POLYETHYLENE GLYCOL 3350 17 G PO PACK: 17.0000 g | PACK | Freq: Every day | ORAL | Status: DC | PRN

## 2018-08-16 MED ORDER — BUPIVACAINE IN DEXTROSE 0.75-8.25 % IT SOLN
INTRATHECAL | Status: DC | PRN
  Administered 2018-08-16: 1.8 mL via INTRATHECAL

## 2018-08-16 MED ORDER — BISACODYL 10 MG RE SUPP: 10.0000 mg | Freq: Every day | RECTAL | Status: DC | PRN

## 2018-08-16 MED ORDER — TRANEXAMIC ACID-NACL 1000-0.7 MG/100ML-% IV SOLN
INTRAVENOUS | Status: AC | PRN
  Administered 2018-08-16: 2000 mg via INTRAVENOUS

## 2018-08-16 MED ORDER — ROSUVASTATIN CALCIUM 20 MG PO TABS
20.0000 mg | ORAL_TABLET | Freq: Every evening | ORAL | Status: DC
  Administered 2018-08-16 – 2018-08-19 (×4): 20 mg via ORAL
  Filled 2018-08-16 (×4): qty 1

## 2018-08-16 MED ORDER — METOCLOPRAMIDE HCL 5 MG/ML IJ SOLN: 5.0000 mg | Freq: Three times a day (TID) | INTRAMUSCULAR | Status: DC | PRN

## 2018-08-16 MED ORDER — LIDOCAINE 2% (20 MG/ML) 5 ML SYRINGE
INTRAMUSCULAR | Status: AC
  Filled 2018-08-16: qty 5

## 2018-08-16 MED ORDER — PHENOL 1.4 % MT LIQD: 1.0000 | OROMUCOSAL | Status: DC | PRN

## 2018-08-16 MED ORDER — DOCUSATE SODIUM 100 MG PO CAPS
100.0000 mg | ORAL_CAPSULE | Freq: Two times a day (BID) | ORAL | Status: DC
  Administered 2018-08-16 – 2018-08-20 (×8): 100 mg via ORAL
  Filled 2018-08-16 (×8): qty 1

## 2018-08-16 MED ORDER — FLEET ENEMA 7-19 GM/118ML RE ENEM: 1.0000 | ENEMA | Freq: Once | RECTAL | Status: DC | PRN

## 2018-08-16 MED ORDER — SODIUM CHLORIDE 0.9 % IR SOLN
Status: DC | PRN
  Administered 2018-08-16: 1000 mL

## 2018-08-16 MED ORDER — PROPOFOL 500 MG/50ML IV EMUL
INTRAVENOUS | Status: DC | PRN
  Administered 2018-08-16: 100 ug/kg/min via INTRAVENOUS

## 2018-08-16 MED ORDER — PROPOFOL 10 MG/ML IV BOLUS
INTRAVENOUS | Status: AC
  Filled 2018-08-16: qty 20

## 2018-08-16 MED ORDER — ACETAMINOPHEN 10 MG/ML IV SOLN
1000.0000 mg | Freq: Four times a day (QID) | INTRAVENOUS | Status: DC
  Administered 2018-08-16: 1000 mg via INTRAVENOUS
  Filled 2018-08-16: qty 100

## 2018-08-16 MED ORDER — CEFAZOLIN SODIUM-DEXTROSE 2-4 GM/100ML-% IV SOLN
2.0000 g | INTRAVENOUS | Status: AC
  Administered 2018-08-16: 2 g via INTRAVENOUS
  Filled 2018-08-16: qty 100

## 2018-08-16 MED ORDER — DIPHENHYDRAMINE HCL 12.5 MG/5ML PO ELIX: 12.5000 mg | ORAL_SOLUTION | ORAL | Status: DC | PRN

## 2018-08-16 MED ORDER — METHOCARBAMOL 500 MG PO TABS
500.0000 mg | ORAL_TABLET | Freq: Four times a day (QID) | ORAL | Status: DC | PRN
  Administered 2018-08-16 – 2018-08-20 (×8): 500 mg via ORAL
  Filled 2018-08-16 (×8): qty 1

## 2018-08-16 MED ORDER — PANTOPRAZOLE SODIUM 40 MG PO TBEC: 40.0000 mg | DELAYED_RELEASE_TABLET | Freq: Every day | ORAL | Status: DC | PRN

## 2018-08-16 MED ORDER — OXYCODONE HCL 5 MG PO TABS
5.0000 mg | ORAL_TABLET | ORAL | Status: DC | PRN
  Administered 2018-08-16 – 2018-08-18 (×9): 5 mg via ORAL
  Administered 2018-08-18: 10 mg via ORAL
  Administered 2018-08-18: 5 mg via ORAL
  Administered 2018-08-19 (×2): 10 mg via ORAL
  Administered 2018-08-19: 5 mg via ORAL
  Administered 2018-08-20: 10 mg via ORAL
  Administered 2018-08-20 (×2): 5 mg via ORAL
  Filled 2018-08-16 (×2): qty 1
  Filled 2018-08-16 (×2): qty 2
  Filled 2018-08-16 (×2): qty 1
  Filled 2018-08-16 (×2): qty 2
  Filled 2018-08-16 (×10): qty 1

## 2018-08-16 MED ORDER — MECLIZINE HCL 25 MG PO TABS: 25.0000 mg | ORAL_TABLET | Freq: Two times a day (BID) | ORAL | Status: DC | PRN

## 2018-08-16 MED ORDER — ROCURONIUM BROMIDE 10 MG/ML (PF) SYRINGE
PREFILLED_SYRINGE | INTRAVENOUS | Status: AC
  Filled 2018-08-16: qty 10

## 2018-08-16 MED ORDER — POTASSIUM CHLORIDE CRYS ER 20 MEQ PO TBCR
20.0000 meq | EXTENDED_RELEASE_TABLET | Freq: Every day | ORAL | Status: DC
  Administered 2018-08-17 – 2018-08-20 (×4): 20 meq via ORAL
  Filled 2018-08-16 (×4): qty 1

## 2018-08-16 MED ORDER — SODIUM CHLORIDE 0.9 % IJ SOLN: INTRAMUSCULAR | Status: DC | PRN

## 2018-08-16 MED ORDER — ASPIRIN EC 325 MG PO TBEC
325.0000 mg | DELAYED_RELEASE_TABLET | Freq: Every day | ORAL | Status: DC
  Administered 2018-08-17 – 2018-08-20 (×4): 325 mg via ORAL
  Filled 2018-08-16 (×5): qty 1

## 2018-08-16 MED ORDER — SODIUM CHLORIDE 0.9 % IV SOLN
INTRAVENOUS | Status: DC
  Administered 2018-08-16 – 2018-08-17 (×2): via INTRAVENOUS

## 2018-08-16 MED ORDER — DEXAMETHASONE SODIUM PHOSPHATE 10 MG/ML IJ SOLN
10.0000 mg | Freq: Once | INTRAMUSCULAR | Status: AC
  Administered 2018-08-17: 10 mg via INTRAVENOUS
  Filled 2018-08-16: qty 1

## 2018-08-16 MED ORDER — TRAMADOL HCL 50 MG PO TABS: 50.0000 mg | ORAL_TABLET | Freq: Four times a day (QID) | ORAL | Status: DC | PRN

## 2018-08-16 MED ORDER — ONDANSETRON HCL 4 MG/2ML IJ SOLN
INTRAMUSCULAR | Status: DC | PRN
  Administered 2018-08-16: 4 mg via INTRAVENOUS

## 2018-08-16 MED ORDER — ONDANSETRON HCL 4 MG PO TABS
4.0000 mg | ORAL_TABLET | Freq: Four times a day (QID) | ORAL | Status: DC | PRN
  Administered 2018-08-18: 4 mg via ORAL
  Filled 2018-08-16: qty 1

## 2018-08-16 MED ORDER — MENTHOL 3 MG MT LOZG: 1.0000 | LOZENGE | OROMUCOSAL | Status: DC | PRN

## 2018-08-16 MED ORDER — BUPIVACAINE LIPOSOME 1.3 % IJ SUSP
INTRAMUSCULAR | Status: DC | PRN
  Administered 2018-08-16: 20 mL

## 2018-08-16 MED ORDER — METHOCARBAMOL 500 MG IVPB - SIMPLE MED
INTRAVENOUS | Status: AC
  Filled 2018-08-16: qty 50

## 2018-08-16 MED ORDER — FUROSEMIDE 20 MG PO TABS
20.0000 mg | ORAL_TABLET | Freq: Every day | ORAL | Status: DC
  Administered 2018-08-17 – 2018-08-19 (×2): 20 mg via ORAL
  Filled 2018-08-16 (×4): qty 1

## 2018-08-16 MED ORDER — CHLORHEXIDINE GLUCONATE 4 % EX LIQD: 60.0000 mL | Freq: Once | CUTANEOUS | Status: DC

## 2018-08-16 MED ORDER — CLOPIDOGREL BISULFATE 75 MG PO TABS
75.0000 mg | ORAL_TABLET | Freq: Every day | ORAL | Status: DC
  Administered 2018-08-17 – 2018-08-20 (×4): 75 mg via ORAL
  Filled 2018-08-16 (×4): qty 1

## 2018-08-16 MED ORDER — FENTANYL CITRATE (PF) 100 MCG/2ML IJ SOLN
INTRAMUSCULAR | Status: AC
  Filled 2018-08-16: qty 2

## 2018-08-16 MED ORDER — LACTATED RINGERS IV SOLN
INTRAVENOUS | Status: DC
  Administered 2018-08-16: 11:00:00 via INTRAVENOUS

## 2018-08-16 MED ORDER — ROPIVACAINE HCL 7.5 MG/ML IJ SOLN
INTRAMUSCULAR | Status: DC | PRN
  Administered 2018-08-16: 20 mL via PERINEURAL

## 2018-08-16 MED ORDER — SUCCINYLCHOLINE CHLORIDE 200 MG/10ML IV SOSY
PREFILLED_SYRINGE | INTRAVENOUS | Status: AC
  Filled 2018-08-16: qty 10

## 2018-08-16 MED ORDER — ONDANSETRON HCL 4 MG/2ML IJ SOLN: 4.0000 mg | Freq: Once | INTRAMUSCULAR | Status: DC | PRN

## 2018-08-16 MED ORDER — HYDROMORPHONE HCL 1 MG/ML IJ SOLN: 0.5000 mg | INTRAMUSCULAR | Status: DC | PRN

## 2018-08-16 MED ORDER — MIDAZOLAM HCL 2 MG/2ML IJ SOLN
1.0000 mg | INTRAMUSCULAR | Status: DC
  Administered 2018-08-16: 1 mg via INTRAVENOUS
  Filled 2018-08-16: qty 2

## 2018-08-16 SURGICAL SUPPLY — 59 items
ATTUNE PSFEM LTSZ6 NARCEM KNEE (Femur) ×3 IMPLANT
ATTUNE PSRP INSR SZ6 8 KNEE (Insert) ×2 IMPLANT
ATTUNE PSRP INSR SZ6 8MM KNEE (Insert) ×1 IMPLANT
BAG ZIPLOCK 12X15 (MISCELLANEOUS) ×3 IMPLANT
BANDAGE ACE 6X5 VEL STRL LF (GAUZE/BANDAGES/DRESSINGS) ×3 IMPLANT
BASE TIBIAL ROT PLAT SZ 5 KNEE (Knees) ×1 IMPLANT
BLADE SAG 18X100X1.27 (BLADE) ×3 IMPLANT
BLADE SAW SGTL 11.0X1.19X90.0M (BLADE) ×3 IMPLANT
BNDG ELASTIC 6X10 VLCR STRL LF (GAUZE/BANDAGES/DRESSINGS) ×3 IMPLANT
BOWL SMART MIX CTS (DISPOSABLE) ×3 IMPLANT
CEMENT HV SMART SET (Cement) ×6 IMPLANT
CLOSURE WOUND 1/2 X4 (GAUZE/BANDAGES/DRESSINGS) ×1
COVER SURGICAL LIGHT HANDLE (MISCELLANEOUS) ×3 IMPLANT
COVER WAND RF STERILE (DRAPES) ×3 IMPLANT
CUFF TOURN SGL QUICK 34 (TOURNIQUET CUFF) ×2
CUFF TRNQT CYL 34X4X40X1 (TOURNIQUET CUFF) ×1 IMPLANT
DECANTER SPIKE VIAL GLASS SM (MISCELLANEOUS) ×3 IMPLANT
DRAPE U-SHAPE 47X51 STRL (DRAPES) ×3 IMPLANT
DRSG ADAPTIC 3X8 NADH LF (GAUZE/BANDAGES/DRESSINGS) ×3 IMPLANT
DRSG PAD ABDOMINAL 8X10 ST (GAUZE/BANDAGES/DRESSINGS) ×3 IMPLANT
DURAPREP 26ML APPLICATOR (WOUND CARE) ×3 IMPLANT
ELECT REM PT RETURN 15FT ADLT (MISCELLANEOUS) ×3 IMPLANT
EVACUATOR 1/8 PVC DRAIN (DRAIN) ×3 IMPLANT
GAUZE SPONGE 4X4 12PLY STRL (GAUZE/BANDAGES/DRESSINGS) ×3 IMPLANT
GLOVE BIO SURGEON STRL SZ7 (GLOVE) ×3 IMPLANT
GLOVE BIO SURGEON STRL SZ8 (GLOVE) ×3 IMPLANT
GLOVE BIOGEL PI IND STRL 6.5 (GLOVE) ×1 IMPLANT
GLOVE BIOGEL PI IND STRL 7.0 (GLOVE) ×1 IMPLANT
GLOVE BIOGEL PI IND STRL 8 (GLOVE) ×1 IMPLANT
GLOVE BIOGEL PI INDICATOR 6.5 (GLOVE) ×2
GLOVE BIOGEL PI INDICATOR 7.0 (GLOVE) ×2
GLOVE BIOGEL PI INDICATOR 8 (GLOVE) ×2
GLOVE SURG SS PI 6.5 STRL IVOR (GLOVE) ×3 IMPLANT
GOWN STRL REUS W/TWL LRG LVL3 (GOWN DISPOSABLE) ×6 IMPLANT
GOWN STRL REUS W/TWL XL LVL3 (GOWN DISPOSABLE) ×3 IMPLANT
HANDPIECE INTERPULSE COAX TIP (DISPOSABLE) ×2
HOLDER FOLEY CATH W/STRAP (MISCELLANEOUS) IMPLANT
IMMOBILIZER KNEE 20 (SOFTGOODS) ×6 IMPLANT
IMMOBILIZER KNEE 20 THIGH 36 (SOFTGOODS) ×1 IMPLANT
MANIFOLD NEPTUNE II (INSTRUMENTS) ×3 IMPLANT
NS IRRIG 1000ML POUR BTL (IV SOLUTION) ×3 IMPLANT
PACK TOTAL KNEE CUSTOM (KITS) ×3 IMPLANT
PAD ABD 8X10 STRL (GAUZE/BANDAGES/DRESSINGS) ×3 IMPLANT
PADDING CAST COTTON 6X4 STRL (CAST SUPPLIES) ×3 IMPLANT
PATELLA MEDIAL ATTUN 35MM KNEE (Knees) ×3 IMPLANT
PIN STEINMAN FIXATION KNEE (PIN) ×3 IMPLANT
POSITIONER SURGICAL ARM (MISCELLANEOUS) ×3 IMPLANT
SET HNDPC FAN SPRY TIP SCT (DISPOSABLE) ×1 IMPLANT
STRIP CLOSURE SKIN 1/2X4 (GAUZE/BANDAGES/DRESSINGS) ×2 IMPLANT
SUT MNCRL AB 4-0 PS2 18 (SUTURE) ×3 IMPLANT
SUT STRATAFIX 0 PDS 27 VIOLET (SUTURE) ×3
SUT VIC AB 2-0 CT1 27 (SUTURE) ×8
SUT VIC AB 2-0 CT1 TAPERPNT 27 (SUTURE) ×4 IMPLANT
SUTURE STRATFX 0 PDS 27 VIOLET (SUTURE) ×1 IMPLANT
TIBIAL BASE ROT PLAT SZ 5 KNEE (Knees) ×3 IMPLANT
TRAY FOLEY MTR SLVR 16FR STAT (SET/KITS/TRAYS/PACK) ×3 IMPLANT
WATER STERILE IRR 1000ML POUR (IV SOLUTION) ×6 IMPLANT
WRAP KNEE MAXI GEL POST OP (GAUZE/BANDAGES/DRESSINGS) ×3 IMPLANT
YANKAUER SUCT BULB TIP 10FT TU (MISCELLANEOUS) ×3 IMPLANT

## 2018-08-16 NOTE — Progress Notes (Signed)
Assisted Dr. Lissa Hoard with left, ultrasound guided, adductor canal block. Side rails up, monitors on throughout procedure. See vital signs in flow sheet. Tolerated Procedure well.

## 2018-08-16 NOTE — Discharge Instructions (Signed)
° °Dr. Frank Aluisio °Total Joint Specialist °[facility] °[address] °[place], [place] [address] °[phone] ° °TOTAL KNEE REPLACEMENT POSTOPERATIVE DIRECTIONS ° °Knee Rehabilitation, Guidelines Following Surgery  °Results after knee surgery are often greatly improved when you follow the exercise, range of motion and muscle strengthening exercises prescribed by your doctor. Safety measures are also important to protect the knee from further injury. Any time any of these exercises cause you to have increased pain or swelling in your knee joint, decrease the amount until you are comfortable again and slowly increase them. If you have problems or questions, call your caregiver or physical therapist for advice.  ° °HOME CARE INSTRUCTIONS  °• Remove items at home which could result in a fall. This includes throw rugs or furniture in walking pathways.  °· ICE to the affected knee every three hours for 30 minutes at a time and then as needed for pain and swelling.  Continue to use ice on the knee for pain and swelling from surgery. You may notice swelling that will progress down to the foot and ankle.  This is normal after surgery.  Elevate the leg when you are not up walking on it.   °· Continue to use the breathing machine which will help keep your temperature down.  It is common for your temperature to cycle up and down following surgery, especially at night when you are not up moving around and exerting yourself.  The breathing machine keeps your lungs expanded and your temperature down. °· Do not place pillow under knee, focus on keeping the knee straight while resting ° °DIET °You may resume your previous home diet once your are discharged from the hospital. ° °DRESSING / WOUND CARE / SHOWERING °You may shower 3 days after surgery, but keep the wounds dry during showering.  You may use an occlusive plastic wrap (Press'n Seal for example), NO SOAKING/SUBMERGING IN THE BATHTUB.  If the bandage  gets wet, change with a clean dry gauze.  If the incision gets wet, pat the wound dry with a clean towel. °You may start showering once you are discharged home but do not submerge the incision under water. Just pat the incision dry and apply a dry gauze dressing on daily. °Change the surgical dressing daily and reapply a dry dressing each time. ° °ACTIVITY °Walk with your walker as instructed. °Use walker as long as suggested by your caregivers. °Avoid periods of inactivity such as sitting longer than an hour when not asleep. This helps prevent blood clots.  °You may resume a sexual relationship in one month or when given the OK by your doctor.  °You may return to work once you are cleared by your doctor.  °Do not drive a car for 6 weeks or until released by you surgeon.  °Do not drive while taking narcotics. ° °WEIGHT BEARING °Weight bearing as tolerated with assist device (walker, cane, etc) as directed, use it as long as suggested by your surgeon or therapist, typically at least 4-6 weeks. ° °POSTOPERATIVE CONSTIPATION PROTOCOL °Constipation - defined medically as fewer than three stools per week and severe constipation as less than one stool per week. ° °One of the most common issues patients have following surgery is constipation.  Even if you have a regular bowel pattern at home, your normal regimen is likely to be disrupted due to multiple reasons following surgery.  Combination of anesthesia, postoperative narcotics, change in appetite and fluid intake all can affect your bowels.    In order to avoid complications following surgery, here are some recommendations in order to help you during your recovery period. ° °Colace (docusate) - Pick up an over-the-counter form of Colace or another stool softener and take twice a day as long as you are requiring postoperative pain medications.  Take with a full glass of water daily.  If you experience loose stools or diarrhea, hold the colace until you stool forms back  up.  If your symptoms do not get better within 1 week or if they get worse, check with your doctor. ° °Dulcolax (bisacodyl) - Pick up over-the-counter and take as directed by the product packaging as needed to assist with the movement of your bowels.  Take with a full glass of water.  Use this product as needed if not relieved by Colace only.  ° °MiraLax (polyethylene glycol) - Pick up over-the-counter to have on hand.  MiraLax is a solution that will increase the amount of water in your bowels to assist with bowel movements.  Take as directed and can mix with a glass of water, juice, soda, coffee, or tea.  Take if you go more than two days without a movement. °Do not use MiraLax more than once per day. Call your doctor if you are still constipated or irregular after using this medication for 7 days in a row. ° °If you continue to have problems with postoperative constipation, please contact the office for further assistance and recommendations.  If you experience "the worst abdominal pain ever" or develop nausea or vomiting, please contact the office immediatly for further recommendations for treatment. ° °ITCHING ° If you experience itching with your medications, try taking only a single pain pill, or even half a pain pill at a time.  You can also use Benadryl over the counter for itching or also to help with sleep.  ° °TED HOSE STOCKINGS °Wear the elastic stockings on both legs for three weeks following surgery during the day but you may remove then at night for sleeping. ° °MEDICATIONS °See your medication summary on the “After Visit Summary” that the nursing staff will review with you prior to discharge.  You may have some home medications which will be placed on hold until you complete the course of blood thinner medication.  It is important for you to complete the blood thinner medication as prescribed by your surgeon.  Continue your approved medications as instructed at time of discharge. ° °PRECAUTIONS °If  you experience chest pain or shortness of breath - call 911 immediately for transfer to the hospital emergency department.  °If you develop a fever greater that 101 F, purulent drainage from wound, increased redness or drainage from wound, foul odor from the wound/dressing, or calf pain - CONTACT YOUR SURGEON.   °                                                °FOLLOW-UP APPOINTMENTS °Make sure you keep all of your appointments after your operation with your surgeon and caregivers. You should call the office at the above phone number and make an appointment for approximately two weeks after the date of your surgery or on the date instructed by your surgeon outlined in the "After Visit Summary". ° ° °RANGE OF MOTION AND STRENGTHENING EXERCISES  °Rehabilitation of the knee is important following a knee injury or   an operation. After just a few days of immobilization, the muscles of the thigh which control the knee become weakened and shrink (atrophy). Knee exercises are designed to build up the tone and strength of the thigh muscles and to improve knee motion. Often times heat used for twenty to thirty minutes before working out will loosen up your tissues and help with improving the range of motion but do not use heat for the first two weeks following surgery. These exercises can be done on a training (exercise) mat, on the floor, on a table or on a bed. Use what ever works the best and is most comfortable for you Knee exercises include:  °• Leg Lifts - While your knee is still immobilized in a splint or cast, you can do straight leg raises. Lift the leg to 60 degrees, hold for 3 sec, and slowly lower the leg. Repeat 10-20 times 2-3 times daily. Perform this exercise against resistance later as your knee gets better.  °• Quad and Hamstring Sets - Tighten up the muscle on the front of the thigh (Quad) and hold for 5-10 sec. Repeat this 10-20 times hourly. Hamstring sets are done by pushing the foot backward against an  object and holding for 5-10 sec. Repeat as with quad sets.  °· Leg Slides: Lying on your back, slowly slide your foot toward your buttocks, bending your knee up off the floor (only go as far as is comfortable). Then slowly slide your foot back down until your leg is flat on the floor again. °· Angel Wings: Lying on your back spread your legs to the side as far apart as you can without causing discomfort.  °A rehabilitation program following serious knee injuries can speed recovery and prevent re-injury in the future due to weakened muscles. Contact your doctor or a physical therapist for more information on knee rehabilitation.  ° °IF YOU ARE TRANSFERRED TO A SKILLED REHAB FACILITY °If the patient is transferred to a skilled rehab facility following release from the hospital, a list of the current medications will be sent to the facility for the patient to continue.  When discharged from the skilled rehab facility, please have the facility set up the patient's Home Health Physical Therapy prior to being released. Also, the skilled facility will be responsible for providing the patient with their medications at time of release from the facility to include their pain medication, the muscle relaxants, and their blood thinner medication. If the patient is still at the rehab facility at time of the two week follow up appointment, the skilled rehab facility will also need to assist the patient in arranging follow up appointment in our office and any transportation needs. ° °MAKE SURE YOU:  °• Understand these instructions.  °• Get help right away if you are not doing well or get worse.  ° ° °Pick up stool softner and laxative for home use following surgery while on pain medications. °Do not submerge incision under water. °Please use good hand washing techniques while changing dressing each day. °May shower starting three days after surgery. °Please use a clean towel to pat the incision dry following showers. °Continue to  use ice for pain and swelling after surgery. °Do not use any lotions or creams on the incision until instructed by your surgeon. ° °

## 2018-08-16 NOTE — Interval H&P Note (Signed)
History and Physical Interval Note:  08/16/2018 10:48 AM  Deborah Elliott  has presented today for surgery, with the diagnosis of left knee osteoarthritis  The various methods of treatment have been discussed with the patient and family. After consideration of risks, benefits and other options for treatment, the patient has consented to  Procedure(s) with comments: LEFT TOTAL KNEE ARTHROPLASTY (Left) - 100min as a surgical intervention .  The patient's history has been reviewed, patient examined, no change in status, stable for surgery.  I have reviewed the patient's chart and labs.  Questions were answered to the patient's satisfaction.     Pilar Plate [name]

## 2018-08-16 NOTE — Anesthesia Procedure Notes (Signed)
Anesthesia Regional Block: Adductor canal block   Pre-Anesthetic Checklist: ,, timeout performed, Correct Patient, Correct Site, Correct Laterality, Correct Procedure, Correct Position, site marked, Risks and benefits discussed,  Surgical consent,  Pre-op evaluation,  At surgeon's request and post-op pain management  Laterality: Left  Prep: chloraprep       Needles:  Injection technique: Single-shot  Needle Type: Stimiplex     Needle Length: 9cm  Needle Gauge: 21     Additional Needles:   Procedures:,,,, ultrasound used (permanent image in chart),,,,  Narrative:  Start time: 08/16/2018 12:26 PM End time: 08/16/2018 12:28 PM Injection made incrementally with aspirations every 5 mL.  Performed by: Personally  Anesthesiologist: Nolon Nations, MD  Additional Notes: BP cuff, EKG monitors applied. Sedation begun. Artery and nerve location verified with U/S and anesthetic injected incrementally, slowly, and after negative aspirations under direct u/s guidance. Good fascial /perineural spread. Tolerated well.

## 2018-08-16 NOTE — Anesthesia Preprocedure Evaluation (Addendum)
Anesthesia Evaluation  Patient identified by MRN, date of birth, ID band Patient awake    Reviewed: Allergy & Precautions, H&P , NPO status , Patient's Chart, lab work & pertinent test results, reviewed documented beta blocker date and time   History of Anesthesia Complications (+) PONV and history of anesthetic complications  Airway Mallampati: II  TM Distance: >3 FB Neck ROM: Full    Dental  (+) Dental Advisory Given   Pulmonary shortness of breath, sleep apnea , former smoker,  CXR: No acute abnormalities.   Pulmonary exam normal breath sounds clear to auscultation       Cardiovascular Exercise Tolerance: Good hypertension, Pt. on medications and Pt. on home beta blockers + CAD and + Cardiac Stents  Normal cardiovascular exam+ dysrhythmias  Rhythm:Regular Rate:Normal  Recent clearance Dr. Gwenlyn Found. No current cardiopulmonary symptoms.  ECG: First degree AV block.   Neuro/Psych negative neurological ROS  negative psych ROS   GI/Hepatic Neg liver ROS, GERD  ,  Endo/Other  diabetes, Type 2Morbid obesity  Renal/GU negative Renal ROS  negative genitourinary   Musculoskeletal  (+) Arthritis ,   Abdominal (+) + obese,   Peds negative pediatric ROS (+)  Hematology  (+) Blood dyscrasia, anemia ,   Anesthesia Other Findings   Reproductive/Obstetrics negative OB ROS                             Anesthesia Physical  Anesthesia Plan  ASA: III  Anesthesia Plan: Spinal   Post-op Pain Management:  Regional for Post-op pain   Induction:   PONV Risk Score and Plan: 4 or greater and Ondansetron, Dexamethasone and Treatment may vary due to age or medical condition  Airway Management Planned:   Additional Equipment:   Intra-op Plan:   Post-operative Plan:   Informed Consent: I have reviewed the patients History and Physical, chart, labs and discussed the procedure including the risks,  benefits and alternatives for the proposed anesthesia with the patient or authorized representative who has indicated his/her understanding and acceptance.   Dental advisory given  Plan Discussed with: CRNA  Anesthesia Plan Comments: (Last plavix was 08-24-13. Plan general.)      Anesthesia Quick Evaluation

## 2018-08-16 NOTE — Transfer of Care (Signed)
Immediate Anesthesia Transfer of Care Note  Patient: Deborah Elliott  Procedure(s) Performed: LEFT TOTAL KNEE ARTHROPLASTY (Left Knee)  Patient Location: PACU  Anesthesia Type:Spinal and MAC combined with regional for post-op pain  Level of Consciousness: awake, alert , oriented and patient cooperative  Airway & Oxygen Therapy: Patient Spontanous Breathing and Patient connected to face mask oxygen  Post-op Assessment: Report given to RN and Post -op Vital signs reviewed and stable  Post vital signs: Reviewed and stable  Last Vitals:  Vitals Value Taken Time  BP 111/64 08/16/2018  2:34 PM  Temp    Pulse 61 08/16/2018  2:36 PM  Resp 20 08/16/2018  2:36 PM  SpO2 99 % 08/16/2018  2:36 PM  Vitals shown include unvalidated device data.  Last Pain:  Vitals:   08/16/18 1236  TempSrc:   PainSc: 0-No pain      Patients Stated Pain Goal: 4 (63/78/58 8502)  Complications: No apparent anesthesia complications

## 2018-08-16 NOTE — Op Note (Signed)
OPERATIVE REPORT-TOTAL KNEE ARTHROPLASTY   Pre-operative diagnosis- Osteoarthritis  Left knee(s)  Post-operative diagnosis- Osteoarthritis Left knee(s)  Procedure-  Left  Total Knee Arthroplasty  Surgeon- Deborah Elliott. Heidi Lemay, MD  Assistant- Ardeen Jourdain, PA-C   Anesthesia-  Adductor canal block and spinal  EBL-20 mL   Drains Hemovac  Tourniquet time-  Total Tourniquet Time Documented: Thigh (Left) - 34 minutes Total: Thigh (Left) - 34 minutes     Complications- None  Condition-PACU - hemodynamically stable.   Brief Clinical Note  Deborah Elliott is a 72 y.o. year old female with end stage OA of her left knee with progressively worsening pain and dysfunction. She has constant pain, with activity and at rest and significant functional deficits with difficulties even with ADLs. She has had extensive non-op management including analgesics, injections of cortisone and viscosupplements, and home exercise program, but remains in significant pain with significant dysfunction. Radiographs show bone on bone arthritis medial and patellofemoral. She presents now for left Total Knee Arthroplasty.    Procedure in detail---   The patient is brought into the operating room and positioned supine on the operating table. After successful administration of  Adductor canal block and spinal,   a tourniquet is placed high on the  Left thigh(s) and the lower extremity is prepped and draped in the usual sterile fashion. Time out is performed by the operating team and then the  Left lower extremity is wrapped in Esmarch, knee flexed and the tourniquet inflated to 300 mmHg.       A midline incision is made with a ten blade through the subcutaneous tissue to the level of the extensor mechanism. A fresh blade is used to make a medial parapatellar arthrotomy. Soft tissue over the proximal medial tibia is subperiosteally elevated to the joint line with a knife and into the semimembranosus bursa with a Cobb  elevator. Soft tissue over the proximal lateral tibia is elevated with attention being paid to avoiding the patellar tendon on the tibial tubercle. The patella is everted, knee flexed 90 degrees and the ACL and PCL are removed. Findings are bone on bone medial and patellofemoral with large global osteophytes.        The drill is used to create a starting hole in the distal femur and the canal is thoroughly irrigated with sterile saline to remove the fatty contents. The 5 degree Left  valgus alignment guide is placed into the femoral canal and the distal femoral cutting block is pinned to remove 9 mm off the distal femur. Resection is made with an oscillating saw.      The tibia is subluxed forward and the menisci are removed. The extramedullary alignment guide is placed referencing proximally at the medial aspect of the tibial tubercle and distally along the second metatarsal axis and tibial crest. The block is pinned to remove 30mm off the more deficient medial  side. Resection is made with an oscillating saw. Size 5is the most appropriate size for the tibia and the proximal tibia is prepared with the modular drill and keel punch for that size.      The femoral sizing guide is placed and size 6 is most appropriate. Rotation is marked off the epicondylar axis and confirmed by creating a rectangular flexion gap at 90 degrees. The size 6 cutting block is pinned in this rotation and the anterior, posterior and chamfer cuts are made with the oscillating saw. The intercondylar block is then placed and that cut is made.  Trial size 5 tibial component, trial size 6 narrow posterior stabilized femur and a 8  mm posterior stabilized rotating platform insert trial is placed. Full extension is achieved with excellent varus/valgus and anterior/posterior balance throughout full range of motion. The patella is everted and thickness measured to be 22  mm. Free hand resection is taken to 12 mm, a 35 template is placed, lug  holes are drilled, trial patella is placed, and it tracks normally. Osteophytes are removed off the posterior femur with the trial in place. All trials are removed and the cut bone surfaces prepared with pulsatile lavage. Cement is mixed and once ready for implantation, the size 5 tibial implant, size  6 narrow posterior stabilized femoral component, and the size 35 patella are cemented in place and the patella is held with the clamp. The trial insert is placed and the knee held in full extension. The Exparel (20 ml mixed with 60 ml saline) is injected into the extensor mechanism, posterior capsule, medial and lateral gutters and subcutaneous tissues.  All extruded cement is removed and once the cement is hard the permanent 8 mm posterior stabilized rotating platform insert is placed into the tibial tray.      The wound is copiously irrigated with saline solution and the extensor mechanism closed over a hemovac drain with #1 V-loc suture. The tourniquet is released for a total tourniquet time of 34  minutes. Flexion against gravity is 140 degrees and the patella tracks normally. Subcutaneous tissue is closed with 2.0 vicryl and subcuticular with running 4.0 Monocryl. The incision is cleaned and dried and steri-strips and a bulky sterile dressing are applied. The limb is placed into a knee immobilizer and the patient is awakened and transported to recovery in stable condition.      Please note that a surgical assistant was a medical necessity for this procedure in order to perform it in a safe and expeditious manner. Surgical assistant was necessary to retract the ligaments and vital neurovascular structures to prevent injury to them and also necessary for proper positioning of the limb to allow for anatomic placement of the prosthesis.   Deborah Elliott Deborah Hollman, MD    08/16/2018, 2:11 PM

## 2018-08-16 NOTE — Plan of Care (Signed)
Plan of care 

## 2018-08-16 NOTE — Anesthesia Procedure Notes (Signed)
Spinal  Patient location during procedure: OR Start time: 08/16/2018 12:58 PM End time: 08/16/2018 1:00 PM Staffing Anesthesiologist: Nolon Nations, MD Resident/CRNA: Lind Covert, CRNA Performed: resident/CRNA  Preanesthetic Checklist Completed: patient identified, site marked, surgical consent, pre-op evaluation, timeout performed, IV checked, risks and benefits discussed and monitors and equipment checked Spinal Block Patient position: sitting Prep: DuraPrep Patient monitoring: cardiac monitor, heart rate, continuous pulse ox and blood pressure Approach: midline Location: L3-4 Injection technique: single-shot Needle Needle type: Pencan  Needle gauge: 24 G Needle length: 10 cm Needle insertion depth: 7 cm Assessment Sensory level: T6 Additional Notes Timeout performed. SAB kit date checked. SAB without difficulty

## 2018-08-17 ENCOUNTER — Encounter (HOSPITAL_COMMUNITY): Payer: Self-pay | Admitting: Orthopedic Surgery

## 2018-08-17 DIAGNOSIS — Z6839 Body mass index (BMI) 39.0-39.9, adult: Secondary | ICD-10-CM | POA: Diagnosis not present

## 2018-08-17 DIAGNOSIS — I10 Essential (primary) hypertension: Secondary | ICD-10-CM | POA: Diagnosis present

## 2018-08-17 DIAGNOSIS — D62 Acute posthemorrhagic anemia: Secondary | ICD-10-CM | POA: Diagnosis not present

## 2018-08-17 DIAGNOSIS — Z91048 Other nonmedicinal substance allergy status: Secondary | ICD-10-CM | POA: Diagnosis not present

## 2018-08-17 DIAGNOSIS — E785 Hyperlipidemia, unspecified: Secondary | ICD-10-CM | POA: Diagnosis present

## 2018-08-17 DIAGNOSIS — Z79899 Other long term (current) drug therapy: Secondary | ICD-10-CM | POA: Diagnosis not present

## 2018-08-17 DIAGNOSIS — Z809 Family history of malignant neoplasm, unspecified: Secondary | ICD-10-CM | POA: Diagnosis not present

## 2018-08-17 DIAGNOSIS — Z955 Presence of coronary angioplasty implant and graft: Secondary | ICD-10-CM | POA: Diagnosis not present

## 2018-08-17 DIAGNOSIS — Z882 Allergy status to sulfonamides status: Secondary | ICD-10-CM | POA: Diagnosis not present

## 2018-08-17 DIAGNOSIS — E119 Type 2 diabetes mellitus without complications: Secondary | ICD-10-CM | POA: Diagnosis present

## 2018-08-17 DIAGNOSIS — Z885 Allergy status to narcotic agent status: Secondary | ICD-10-CM | POA: Diagnosis not present

## 2018-08-17 DIAGNOSIS — G4730 Sleep apnea, unspecified: Secondary | ICD-10-CM | POA: Diagnosis present

## 2018-08-17 DIAGNOSIS — E669 Obesity, unspecified: Secondary | ICD-10-CM | POA: Diagnosis present

## 2018-08-17 DIAGNOSIS — Z8249 Family history of ischemic heart disease and other diseases of the circulatory system: Secondary | ICD-10-CM | POA: Diagnosis not present

## 2018-08-17 DIAGNOSIS — K219 Gastro-esophageal reflux disease without esophagitis: Secondary | ICD-10-CM | POA: Diagnosis present

## 2018-08-17 DIAGNOSIS — Z7902 Long term (current) use of antithrombotics/antiplatelets: Secondary | ICD-10-CM | POA: Diagnosis not present

## 2018-08-17 DIAGNOSIS — I2510 Atherosclerotic heart disease of native coronary artery without angina pectoris: Secondary | ICD-10-CM | POA: Diagnosis present

## 2018-08-17 DIAGNOSIS — Z85828 Personal history of other malignant neoplasm of skin: Secondary | ICD-10-CM | POA: Diagnosis not present

## 2018-08-17 DIAGNOSIS — Z79891 Long term (current) use of opiate analgesic: Secondary | ICD-10-CM | POA: Diagnosis not present

## 2018-08-17 DIAGNOSIS — Z833 Family history of diabetes mellitus: Secondary | ICD-10-CM | POA: Diagnosis not present

## 2018-08-17 DIAGNOSIS — Z87891 Personal history of nicotine dependence: Secondary | ICD-10-CM | POA: Diagnosis not present

## 2018-08-17 DIAGNOSIS — Z96651 Presence of right artificial knee joint: Secondary | ICD-10-CM | POA: Diagnosis present

## 2018-08-17 DIAGNOSIS — M25762 Osteophyte, left knee: Secondary | ICD-10-CM | POA: Diagnosis present

## 2018-08-17 DIAGNOSIS — M1712 Unilateral primary osteoarthritis, left knee: Secondary | ICD-10-CM | POA: Diagnosis present

## 2018-08-17 LAB — BASIC METABOLIC PANEL
Anion gap: 6 (ref 5–15)
BUN: 11 mg/dL (ref 8–23)
CO2: 25 mmol/L (ref 22–32)
Calcium: 8.3 mg/dL — ABNORMAL LOW (ref 8.9–10.3)
Chloride: 108 mmol/L (ref 98–111)
Creatinine, Ser: 0.51 mg/dL (ref 0.44–1.00)
GFR calc Af Amer: >60 mL/min (ref >60)
GFR calc non Af Amer: >60 mL/min (ref >60)
Glucose, Bld: 244 mg/dL — ABNORMAL HIGH (ref 70–99)
Potassium: 4.2 mmol/L (ref 3.5–5.1)
Sodium: 139 mmol/L (ref 135–145)

## 2018-08-17 LAB — CBC
HCT: 33.3 % — ABNORMAL LOW (ref 36.0–46.0)
Hemoglobin: 10.6 g/dL — ABNORMAL LOW (ref 12.0–15.0)
MCH: 29.7 pg (ref 26.0–34.0)
MCHC: 31.8 g/dL (ref 30.0–36.0)
MCV: 93.3 fL (ref 80.0–100.0)
Platelets: 133 10*3/uL — ABNORMAL LOW (ref 150–400)
RBC: 3.57 MIL/uL — ABNORMAL LOW (ref 3.87–5.11)
RDW: 13.2 % (ref 11.5–15.5)
WBC: 8.3 10*3/uL (ref 4.0–10.5)
nRBC: 0 % (ref 0.0–0.2)

## 2018-08-17 MED ORDER — OXYCODONE HCL 5 MG PO TABS: 5.0000 mg | ORAL_TABLET | Freq: Four times a day (QID) | ORAL | 0 refills | Status: DC | PRN

## 2018-08-17 MED ORDER — ASPIRIN 325 MG PO TBEC: 325.0000 mg | DELAYED_RELEASE_TABLET | Freq: Every day | ORAL | 0 refills | Status: DC

## 2018-08-17 MED ORDER — METHOCARBAMOL 500 MG PO TABS: 500.0000 mg | ORAL_TABLET | Freq: Four times a day (QID) | ORAL | 0 refills | Status: DC | PRN

## 2018-08-17 MED ORDER — TRAMADOL HCL 50 MG PO TABS: 50.0000 mg | ORAL_TABLET | Freq: Four times a day (QID) | ORAL | 0 refills | Status: DC | PRN

## 2018-08-17 NOTE — Care Management Note (Signed)
Case Management Note  Patient Details  Name: Deborah Elliott MRN: [id number] Date of Birth: 24-Jan-1946  Subjective/Objective:   Spoke with patient at bedside. Confirmed plan for OP PT, already arranged.     Action/Plan: Needs a RW and 3n1, contacted [facility] to deliver to the room. [phone]               Expected Discharge Date:  08/17/18               Expected Discharge Plan:  OP Rehab  In-House Referral:  NA  Discharge planning Services  CM Consult  Post Acute Care Choice:  Durable Medical Equipment Choice offered to:  Patient  DME Arranged:  3-N-1, Walker rolling DME Agency:  [place]:  NA [place] Agency:  NA  Status of Service:  Completed, signed off  If discussed at [place] of Stay Meetings, dates discussed:    Additional Comments:  Guadalupe Maple, RN 08/17/2018, 11:51 AM

## 2018-08-17 NOTE — Evaluation (Signed)
Physical Therapy Evaluation Patient Details Name: Deborah Elliott MRN: [id number] DOB: 04-01-1946 Today's Date: 08/17/2018   History of Present Illness  L TKA, R TKA  Clinical Impression  The patient required extensive assistance to transfer to BSC/recliner. The patient relates increased pain. The patient is incontinent of urine. Reports increased by meds given earlier. Pt admitted with above diagnosis. Pt currently with functional limitations due to the deficits listed below (see PT Problem List).  Pt will benefit from skilled PT to increase their independence and safety with mobility to allow discharge to the venue listed below.       Follow Up Recommendations Follow surgeon's recommendation for DC plan and follow-up therapies(getting to OP PT may be a hardship. Rec. HHPT)    Equipment Recommendations  None recommended by PT    Recommendations for Other Services   OT    Precautions / Restrictions Precautions Precautions: Knee;Fall Required Braces or Orthoses: Knee Immobilizer - Left Knee Immobilizer - Left: Discontinue once straight leg raise with < 10 degree lag Restrictions Weight Bearing Restrictions: No      Mobility  Bed Mobility               General bed mobility comments: sitting on bed edge by  CNA, KI in place with Ice packs inside. Removed ice packs, unable to cinch up KI as patient sitting.  Transfers Overall transfer level: Needs assistance Equipment used: Rolling walker (2 wheeled) Transfers: Sit to/from [facility] Sit to Stand: Mod assist Stand pivot transfers: Mod assist       General transfer comment: pivot from bed to [facility] without RW , cues for safety andd hand placement.  used RW to stand and take a few steps to recliner., Left knee buckling throughout  Ambulation/Gait                Stairs            Wheelchair Mobility    Modified Rankin (Stroke Patients Only)       Balance                                              Pertinent Vitals/Pain Pain Assessment: 0-10 Pain Score: 8  Pain Descriptors / Indicators: Grimacing;Guarding;Moaning;Jabbing;Sharp Pain Intervention(s): Limited activity within patient's tolerance;Monitored during session;Repositioned;Premedicated before session    Home Living Family/patient expects to be discharged to:: Private residence Living Arrangements: Spouse/significant other Available Help at Discharge: Family Type of Home: House Home Access: Stairs to enter   Technical brewer of Steps: 1/2 Home Layout: Multi-level Home Equipment: Environmental consultant - 2 wheels;Cane - single point;Bedside commode;Tub bench      Prior Function Level of Independence: Needs assistance;Independent with assistive device(s)   Gait / Transfers Assistance Needed: amb with Rw           Hand Dominance        Extremity/Trunk Assessment   Upper Extremity Assessment Upper Extremity Assessment: Defer to OT evaluation    Lower Extremity Assessment Lower Extremity Assessment: LLE deficits/detail LLE Deficits / Details: knee buckling during transfers, KI not snug enough       Communication   Communication: No difficulties  Cognition Arousal/Alertness: Awake/alert Behavior During Therapy: Anxious;WFL for tasks assessed/performed Overall Cognitive Status: Within Functional Limits for tasks assessed  General Comments: Needs redirection at times,       General Comments      Exercises     Assessment/Plan    PT Assessment Patient needs continued PT services  PT Problem List Decreased strength;Decreased range of motion;Decreased activity tolerance;Decreased knowledge of use of DME;Decreased safety awareness;Decreased balance;Decreased mobility;Decreased knowledge of precautions       PT Treatment Interventions DME instruction;Therapeutic exercise;Gait training;Stair training;Functional mobility  training;Therapeutic activities;Patient/family education    PT Goals (Current goals can be found in the Care Plan section)  Acute Rehab PT Goals Patient Stated Goal: to  walk PT Goal Formulation: With patient Time For Goal Achievement: 08/24/18 Potential to Achieve Goals: Good    Frequency 7X/week   Barriers to discharge        Co-evaluation               AM-PAC PT "6 Clicks" Daily Activity  Outcome Measure Difficulty turning over in bed (including adjusting bedclothes, sheets and blankets)?: Unable Difficulty moving from lying on back to sitting on the side of the bed? : Unable Difficulty sitting down on and standing up from a chair with arms (e.g., wheelchair, bedside commode, etc,.)?: Unable Help needed moving to and from a bed to chair (including a wheelchair)?: Total Help needed walking in hospital room?: Total Help needed climbing 3-5 steps with a railing? : Total 6 Click Score: 6    End of Session   Activity Tolerance: Patient limited by pain Patient left: in chair;with call bell/phone within reach Nurse Communication: Mobility status PT Visit Diagnosis: Unsteadiness on feet (R26.81);Pain Pain - Right/Left: Left Pain - part of body: Knee    Time: 1030-1100 PT Time Calculation (min) (ACUTE ONLY): 30 min   Charges:   PT Evaluation $PT Eval Moderate Complexity: 1 Mod PT Treatments $Therapeutic Activity: 8-22 mins        Tresa Endo PT Acute Rehabilitation Services Pager [phone] Office [phone]   [name] 08/17/2018, 1:26 PM

## 2018-08-17 NOTE — Anesthesia Postprocedure Evaluation (Signed)
Anesthesia Post Note  Patient: Deborah Elliott  Procedure(s) Performed: LEFT TOTAL KNEE ARTHROPLASTY (Left Knee)     Patient location during evaluation: PACU Anesthesia Type: Spinal and MAC Level of consciousness: awake and alert Pain management: pain level controlled Vital Signs Assessment: post-procedure vital signs reviewed and stable Respiratory status: spontaneous breathing and respiratory function stable Cardiovascular status: blood pressure returned to baseline and stable Postop Assessment: spinal receding Anesthetic complications: no    Last Vitals:  Vitals:   08/17/18 1349 08/17/18 1400  BP: (!) 155/74 (!) 155/74  Pulse: 63 63  Resp:    Temp: 36.8 C 36.8 C  SpO2: 96% 96%    Last Pain:  Vitals:   08/17/18 1959  TempSrc:   PainSc: [place]

## 2018-08-17 NOTE — Plan of Care (Signed)
Pt alert and oriented, plan of care discussed with pt. RN will monitor.

## 2018-08-17 NOTE — Progress Notes (Signed)
Subjective: 1 Day Post-Op Procedure(s) (LRB): LEFT TOTAL KNEE ARTHROPLASTY (Left) Patient reports pain as moderate.   Patient seen in rounds by Dr. Wynelle Link. Patient is well, and has had no acute complaints or problems. No issues overnight. Denies chest pain, SOB, or calf pain. Foley catheter removed this AM. We will start therapy today.   Objective: Vital signs in last 24 hours: Temp:  [97.4 F (36.3 C)-98.4 F (36.9 C)] 97.7 F (36.5 C) (10/29 0611) Pulse Rate:  [55-68] 56 (10/29 0611) Resp:  [11-23] 18 (10/28 1903) BP: (111-165)/(64-109) 128/73 (10/29 0611) SpO2:  [93 %-100 %] 97 % (10/29 0611) Weight:  [104 kg-104.9 kg] 104 kg (10/28 1540)  Intake/Output from previous day:  Intake/Output Summary (Last 24 hours) at 08/17/2018 0752 Last data filed at 08/17/2018 0615 Gross per 24 hour  Intake 3107.08 ml  Output 2045 ml  Net 1062.08 ml    Labs: Recent Labs    08/16/18 1137 08/17/18 0521  HGB 13.3 10.6*   Recent Labs    08/16/18 1137 08/17/18 0521  WBC  --  8.3  RBC  --  3.57*  HCT 39.0 33.3*  PLT  --  133*   Recent Labs    08/16/18 1137 08/17/18 0521  NA 141 139  K 4.1 4.2  CL  --  108  CO2  --  25  BUN  --  11  CREATININE  --  0.51  GLUCOSE 114* 244*  CALCIUM  --  8.3*   Exam: General - Patient is Alert and Oriented Extremity - Neurologically intact Neurovascular intact Sensation intact distally Dorsiflexion/Plantar flexion intact Dressing - dressing C/D/I Motor Function - intact, moving foot and toes well on exam.   Past Medical History:  Diagnosis Date  . Achilles rupture, left 04/06/2012  . Arthritis of knee   . Cancer ([place])    skin cancer  . Coronary artery disease    X3 STENTS  . Diabetes mellitus without complication ([place])   . Dyspnea   . Dysrhythmia    HX of tachycardia - controlled with med  . GERD (gastroesophageal reflux disease)   . Hx of skin cancer, basal cell   . Hyperlipidemia   . Hypertension   . Leg cramps   .  Obesity   . PONV (postoperative nausea and vomiting)   . Sleep apnea    uses c-pap   . Vertigo     Assessment/Plan: 1 Day Post-Op Procedure(s) (LRB): LEFT TOTAL KNEE ARTHROPLASTY (Left) Active Problems:   OA (osteoarthritis) of knee  Estimated body mass index is 39.36 kg/m as calculated from the following:   Height as of this encounter: 5\' 4"  (1.626 m).   Weight as of this encounter: 104 kg. Advance diet Up with therapy  Anticipated LOS equal to or greater than 2 midnights due to - Age 73 and older with one or more of the following:  - Obesity  - Expected need for hospital services (PT, OT, Nursing) required for safe  discharge  - Anticipated need for postoperative skilled nursing care or inpatient rehab  - Active co-morbidities: Diabetes and Coronary Artery Disease OR   - Unanticipated findings during/Post Surgery: None  - Patient is a high risk of re-admission due to: None    DVT Prophylaxis - Aspirin and Plavix Weight bearing as tolerated. D/C O2 and pulse ox and try on room air. Hemovac pulled without difficulty, will begin therapy today.  Plan is to go Home after hospital stay. Plan for discharge  tomorrow if meeting goals with therapy.  Theresa Duty, PA-C Orthopedic Surgery 08/17/2018, 7:52 AM

## 2018-08-17 NOTE — Progress Notes (Signed)
Physical Therapy Treatment Patient Details Name: Deborah Elliott MRN: [id number] DOB: 10-31-1945 Today's Date: 08/17/2018    History of Present Illness L TKA, R TKA    PT Comments    Continue to progress functional mobility  and assess safety for DC next visit. May benefit from [address] initially.   Follow Up Recommendations   recommend HHPT   Equipment Recommendations  RW and 3 in 1 delivered.  Recommendations for Other Services       Precautions / Restrictions Precautions Precautions: Knee;Fall Precaution Comments: significant incontinence of urine. Required Braces or Orthoses: Knee Immobilizer - Left Knee Immobilizer - Left: Discontinue once straight leg raise with < 10 degree lag    Mobility    Stairs             Wheelchair Mobility    Modified Rankin (Stroke Patients Only)       Balance O                             Cognition Arousal/Alertness: Awake/alert Behavior During Therapy: Anxious;WFL for tasks assessed/performed Overall Cognitive Status: Within Functional Limits for tasks assessed                                      Exercises Total Joint Exercises Ankle Circles/Pumps: AROM;Both;10 reps Quad Sets: AROM;Both;10 reps Heel Slides: AAROM;Left;10 reps Hip ABduction/ADduction: AAROM;Left;10 reps Straight Leg Raises: AAROM;Left;10 reps Goniometric ROM: 10 -40 left knee flexion    General Comments        Pertinent Vitals/Pain Pain Assessment: 0-10 Pain Score: 7  Pain Location: Left knee with WB Pain Descriptors / Indicators: Grimacing;Guarding;Moaning;Jabbing;Sharp Pain Intervention(s): Monitored during session;Premedicated before session;Repositioned    Home Living Family/patient expects to be discharged to:: Private residence Living Arrangements: Spouse/significant other Available Help at Discharge: Family Type of Home: House Home Access: Stairs to enter   Home Layout: Multi-level Home Equipment: Environmental consultant  - 2 wheels;Cane - single point;Bedside commode;Tub bench      Prior Function Level of Independence: Needs assistance;Independent with assistive device(s)  Gait / Transfers Assistance Needed: amb with Rw       PT Goals (current goals can now be found in the care plan section) Acute Rehab PT Goals Patient Stated Goal: to  walk PT Goal Formulation: With patient Time For Goal Achievement: 08/24/18 Potential to Achieve Goals: Good Progress towards PT goals: Progressing toward goals    Frequency    7X/week      PT Plan Discharge plan needs to be updated    Co-evaluation              AM-PAC PT "6 Clicks" Daily Activity  Outcome Measure  Difficulty turning over in bed (including adjusting bedclothes, sheets and blankets)?: Unable Difficulty moving from lying on back to sitting on the side of the bed? : Unable Difficulty sitting down on and standing up from a chair with arms (e.g., wheelchair, bedside commode, etc,.)?: Unable Help needed moving to and from a bed to chair (including a wheelchair)?: A Lot Help needed walking in hospital room?: Total Help needed climbing 3-5 steps with a railing? : Total 6 Click Score: 7    End of Session Equipment Utilized During Treatment: Gait belt Activity Tolerance: Patient tolerated treatment well Patient left: in bed;with call bell/phone within reach;with bed alarm set Nurse Communication: Mobility status PT Visit Diagnosis:  Unsteadiness on feet (R26.81);Pain Pain - Right/Left: Left Pain - part of body: Knee     Time: 1450-1502 PT Time Calculation (min) (ACUTE ONLY): 12 min  Charges:  $ $Therapeutic Exercise: 8-22 mins $                     Tresa Endo PT Acute Rehabilitation Services Pager [phone] Office [phone]    [name] 08/17/2018, 3:48 PM

## 2018-08-17 NOTE — Progress Notes (Signed)
Physical Therapy Treatment Patient Details Name: Deborah Elliott.JWJX : [id number] DOB: 08-19-1946 Today's Date: 08/17/2018    History of Present Illness L TKA, R TKA    PT Comments    Assisted patient to ambulate 15' to BR. Patient unsteady, requiring assistance for balance. Patient having difficulty standing to manipulate undies. Patient also having urgency to urinate and is incontinent. Patient requires assistance for balance when toileting. Patient was too fatigued to ambulate back to recliner  And required 2 assist for safety to get to recliner when brought up to the BR door..  Patient's current level of assist  Is requiring 2 persons for safe mobility. Will continue assessing safety for DC as planned tomorrow. May benefit from another day for therapy if remains 2 assist.  Follow Up Recommendations  Follow surgeon's recommendation for DC plan and follow-up therapies     Equipment Recommendations  None recommended by PT    Recommendations for Other Services       Precautions / Restrictions Precautions Precautions: Knee;Fall Precaution Comments: significant incontinence of urine. Required Braces or Orthoses: Knee Immobilizer - Left Knee Immobilizer - Left: Discontinue once straight leg raise with < 10 degree lag    Mobility  Bed Mobility               General bed mobility comments: in recliner.  Transfers Overall transfer level: Needs assistance Equipment used: Rolling walker (2 wheeled) Transfers: Sit to/from Stand Sit to Stand: Mod assist Stand pivot transfers: Mod assist       General transfer comment: Mod steady assist to rise from recliner and BSC in BR. Patient loosing balance, multimodal cues for sfaety , to hold RW, not reach for doorjamb.   Ambulation/Gait Ambulation/Gait assistance: Mod assist Gait Distance (Feet): 15 Feet Assistive device: Rolling walker (2 wheeled) Gait Pattern/deviations: Step-to pattern;Staggering left;Staggering right     General Gait Details: Patient required multimodal cues for safety, hand and left leg position, required mod assist to ambulate to BR. Patient at times let go of RW. Patient was fatigued and unable to ambulate back so recliner  brought to bathroom door . Patient took several side steps  to get to recliner, Patient had difficulty turning .   Stairs             Wheelchair Mobility    Modified Rankin (Stroke Patients Only)       Balance Overall balance assessment: Needs assistance Sitting-balance support: Bilateral upper extremity supported;Feet supported Sitting balance-Leahy Scale: Good     Standing balance support: During functional activity;Bilateral upper extremity supported Standing balance-Leahy Scale: Poor                              Cognition Arousal/Alertness: Awake/alert Behavior During Therapy: Anxious;WFL for tasks assessed/performed Overall Cognitive Status: Within Functional Limits for tasks assessed                                 General Comments: Needs redirection at times, seems concerned about things not related to task of PT session.      Exercises      General Comments        Pertinent Vitals/Pain Pain Assessment: 0-10 Pain Score: 7  Pain Location: Left knee with WB Pain Descriptors / Indicators: Grimacing;Guarding;Moaning;Jabbing;Sharp Pain Intervention(s): Monitored during session;Premedicated before session;Repositioned    Home Living Family/patient expects to be discharged  to:: Private residence Living Arrangements: Spouse/significant other Available Help at Discharge: Family Type of Home: House Home Access: Stairs to enter   Home Layout: [place]: Environmental consultant - 2 wheels;Cane - single point;Bedside commode;Tub bench      Prior Function Level of Independence: Needs assistance;Independent with assistive device(s)  Gait / Transfers Assistance Needed: amb with Rw       PT Goals (current  goals can now be found in the care plan section) Acute Rehab PT Goals Patient Stated Goal: to  walk PT Goal Formulation: With patient Time For Goal Achievement: 08/24/18 Potential to Achieve Goals: Good Progress towards PT goals: Progressing toward goals    Frequency    7X/week      PT Plan Current plan remains appropriate    Co-evaluation              AM-PAC PT "6 Clicks" Daily Activity  Outcome Measure  Difficulty turning over in bed (including adjusting bedclothes, sheets and blankets)?: Unable Difficulty moving from lying on back to sitting on the side of the bed? : Unable Difficulty sitting down on and standing up from a chair with arms (e.g., wheelchair, bedside commode, etc,.)?: Unable Help needed moving to and from a bed to chair (including a wheelchair)?: A Lot Help needed walking in hospital room?: Total Help needed climbing 3-5 steps with a railing? : Total 6 Click Score: 7    End of Session Equipment Utilized During Treatment: Gait belt Activity Tolerance: Patient limited by pain;Patient limited by fatigue Patient left: in chair;with call bell/phone within reach;with family/visitor present Nurse Communication: Mobility status PT Visit Diagnosis: Unsteadiness on feet (R26.81);Pain Pain - Right/Left: Left Pain - part of body: Knee     Time: 1200-1225 PT Time Calculation (min) (ACUTE ONLY): 25 min  Charges:  $Gait Training: 8-22 mins  $Self Care/Home Management: [place] Pager [phone] Office [phone]    [name] 08/17/2018, 3:32 PM

## 2018-08-17 NOTE — Progress Notes (Signed)
Physical Therapy Treatment Patient Details Name: Deborah Elliott MRN: [id number] DOB: 1946/06/12 Today's Date: 08/17/2018    History of Present Illness L TKA, R TKA    PT Comments    The patient continues with slow progress and requires 2 assist for safety. Patient incontinent of urine while ambulating. OPPT may be a hardship unless patient's progress improves significantly. Continue PT.  Follow Up Recommendations  Follow surgeon's recommendation for DC plan and follow-up therapies;Outpatient PT     Equipment Recommendations  None recommended by PT    Recommendations for Other Services       Precautions / Restrictions Precautions Precautions: Knee;Fall Precaution Comments: significant incontinence of urine. Required Braces or Orthoses: Knee Immobilizer - Left Knee Immobilizer - Left: Discontinue once straight leg raise with < 10 degree lag    Mobility  Bed Mobility Overal bed mobility: Needs Assistance Bed Mobility: Sit to Supine       Sit to supine: Mod assist   General bed mobility comments: assist with both legs.  Transfers Overall transfer level: Needs assistance Equipment used: Rolling walker (2 wheeled) Transfers: Sit to/from Stand Sit to Stand: Mod assist;+2 safety/equipment Stand pivot transfers: Mod assist;+2 safety/equipment       General transfer comment: Assist to rise from the recliner and BSC with 2 assist.  steady assist to turn and back up to recliner. Patient is easily self distracting, wanting to know about where her chair is.   Ambulation/Gait Ambulation/Gait assistance: Mod assist;+2 safety/equipment Gait Distance (Feet): 30 Feet Assistive device: Rolling walker (2 wheeled) Gait Pattern/deviations: Step-to pattern Gait velocity: decr   General Gait Details: multimodal cues for sequence and posture. Patient incontinent of urine before getting to [facility].   Stairs             Wheelchair Mobility    Modified Rankin (Stroke Patients  Only)       Balance Overall balance assessment: Needs assistance Sitting-balance support: Bilateral upper extremity supported;Feet supported Sitting balance-Leahy Scale: Good     Standing balance support: During functional activity;Bilateral upper extremity supported Standing balance-Leahy Scale: Poor                              Cognition Arousal/Alertness: Awake/alert Behavior During Therapy: Anxious;WFL for tasks assessed/performed Overall Cognitive Status: Within Functional Limits for tasks assessed                                 General Comments: Needs redirection at times, seems concerned about things not related to task of PT session.      Exercises      General Comments        Pertinent Vitals/Pain Pain Assessment: 0-10 Pain Score: 7  Pain Location: Left knee with WB Pain Descriptors / Indicators: Grimacing;Guarding;Moaning;Jabbing;Sharp Pain Intervention(s): Monitored during session;Premedicated before session;Repositioned    Home Living Family/patient expects to be discharged to:: Private residence Living Arrangements: Spouse/significant other Available Help at Discharge: Family Type of Home: House Home Access: Stairs to enter   Home Layout: Multi-level Home Equipment: Environmental consultant - 2 wheels;Cane - single point;Bedside commode;Tub bench      Prior Function Level of Independence: Needs assistance;Independent with assistive device(s)  Gait / Transfers Assistance Needed: amb with Rw       PT Goals (current goals can now be found in the care plan section) Acute Rehab PT Goals Patient Stated Goal:  to  walk PT Goal Formulation: With patient Time For Goal Achievement: 08/24/18 Potential to Achieve Goals: Good Progress towards PT goals: Progressing toward goals    Frequency    7X/week      PT Plan Current plan remains appropriate    Co-evaluation              AM-PAC PT "6 Clicks" Daily Activity  Outcome Measure   Difficulty turning over in bed (including adjusting bedclothes, sheets and blankets)?: Unable Difficulty moving from lying on back to sitting on the side of the bed? : Unable Difficulty sitting down on and standing up from a chair with arms (e.g., wheelchair, bedside commode, etc,.)?: Unable Help needed moving to and from a bed to chair (including a wheelchair)?: A Lot Help needed walking in hospital room?: Total Help needed climbing 3-5 steps with a railing? : Total 6 Click Score: 7    End of Session Equipment Utilized During Treatment: Gait belt Activity Tolerance: Patient limited by pain;Patient limited by fatigue Patient left: in bed;with call bell/phone within reach;with bed alarm set;with family/visitor present Nurse Communication: Mobility status PT Visit Diagnosis: Unsteadiness on feet (R26.81);Pain Pain - Right/Left: Left Pain - part of body: Knee     Time: 4536-4680 PT Time Calculation (min) (ACUTE ONLY): 40 min  Charges:  $Gait Training: 23-37 mins  $Self Care/Home Management: [place] Pager [phone] Office [phone]    [name] 08/17/2018, 3:44 PM

## 2018-08-18 LAB — BASIC METABOLIC PANEL
Anion gap: 5 (ref 5–15)
BUN: 12 mg/dL (ref 8–23)
CO2: 27 mmol/L (ref 22–32)
Calcium: 8.4 mg/dL — ABNORMAL LOW (ref 8.9–10.3)
Chloride: 107 mmol/L (ref 98–111)
Creatinine, Ser: 0.59 mg/dL (ref 0.44–1.00)
GFR calc Af Amer: >60 mL/min (ref >60)
GFR calc non Af Amer: >60 mL/min (ref >60)
Glucose, Bld: 145 mg/dL — ABNORMAL HIGH (ref 70–99)
Potassium: 3.5 mmol/L (ref 3.5–5.1)
Sodium: 139 mmol/L (ref 135–145)

## 2018-08-18 LAB — CBC
HCT: 34 % — ABNORMAL LOW (ref 36.0–46.0)
Hemoglobin: 10.6 g/dL — ABNORMAL LOW (ref 12.0–15.0)
MCH: 29.1 pg (ref 26.0–34.0)
MCHC: 31.2 g/dL (ref 30.0–36.0)
MCV: 93.4 fL (ref 80.0–100.0)
Platelets: 145 10*3/uL — ABNORMAL LOW (ref 150–400)
RBC: 3.64 MIL/uL — ABNORMAL LOW (ref 3.87–5.11)
RDW: 13.5 % (ref 11.5–15.5)
WBC: 9.2 10*3/uL (ref 4.0–10.5)
nRBC: 0 % (ref 0.0–0.2)

## 2018-08-18 NOTE — Progress Notes (Signed)
OT Cancellation Note  Patient Details Name: Deborah Elliott MRN: [id number] DOB: 1946-07-24   Cancelled Treatment:     Noted pt did not do well with PT this am- will check on pt later in day or next day  Kari Baars, [place] [phone] Office- [phone], Edwena Felty D 08/18/2018, 11:36 AM

## 2018-08-18 NOTE — Evaluation (Signed)
Occupational Therapy Evaluation Patient Details Name: Deborah Elliott MRN: [id number] DOB: 11-Mar-1946 Today's Date: 08/18/2018    History of Present Illness L TKA, R TKA 5 years ago   Clinical Impression   Pt is s/p TKA resulting in the deficits listed below (see OT Problem List).  Pt will benefit from skilled OT to increase their safety and independence with ADL and functional mobility for ADL to facilitate discharge to venue listed below.        Follow Up Recommendations  SNF;Follow surgeon's recommendation for DC plan and follow-up therapies    Equipment Recommendations  None recommended by OT    Recommendations for Other Services       Precautions / Restrictions Precautions Precautions: Knee;Fall Precaution Comments: significant incontinence of urine. Required Braces or Orthoses: Knee Immobilizer - Left Knee Immobilizer - Left: Discontinue once straight leg raise with < 10 degree lag      Mobility Bed Mobility      General bed mobility comments: pt in chair  Transfers Overall transfer level: Needs assistance Equipment used: Rolling walker (2 wheeled) Transfers: Sit to/from Stand Sit to Stand: Max assist         General transfer comment: increased time and encouragement    Balance           Standing balance support: Bilateral upper extremity supported;During functional activity Standing balance-Leahy Scale: Poor Standing balance comment: requires assist to balance as patient not using RW appropriately- had hands in wrong spot even with VC                           ADL either performed or assessed with clinical judgement   ADL Overall ADL's : Needs assistance/impaired Eating/Feeding: Set up;Sitting   Grooming: Set up;Sitting   Upper Body Bathing: Minimal assistance;Sitting   Lower Body Bathing: Total assistance;Sit to/from stand;Cueing for sequencing;Cueing for safety   Upper Body Dressing : Minimal assistance;Sitting   Lower Body  Dressing: Total assistance;Sit to/from stand;Cueing for sequencing;Cueing for safety                 General ADL Comments: eval sit to stand only     Vision Patient Visual Report: No change from baseline       Perception     Praxis      Pertinent Vitals/Pain Pain Score: 8  Pain Location: L knee when OT attempted to hold leg up with scooting fowardin chair Pain Descriptors / Indicators: Grimacing;Guarding;Moaning;Jabbing;Sharp Pain Intervention(s): Limited activity within patient's tolerance;Monitored during session;Repositioned     Hand Dominance     Extremity/Trunk Assessment Upper Extremity Assessment Upper Extremity Assessment: Generalized weakness           Communication Communication Communication: No difficulties   Cognition Arousal/Alertness: Awake/alert Behavior During Therapy: Anxious Overall Cognitive Status: Within Functional Limits for tasks assessed                                 General Comments: frequently states" wait a minute" when moving and experiencing pain   General Comments       Exercises Total Joint Exercises Ankle Circles/Pumps: AROM;Both;10 reps;Supine Quad Sets: AAROM;Both;10 reps;Supine Heel Slides: AAROM;Left;10 reps;Supine Hip ABduction/ADduction: AAROM;Left;10 reps;Supine Straight Leg Raises: AAROM;10 reps;Left;Supine Goniometric ROM: 10-40 left knee flex   Shoulder Instructions      Home Living Family/patient expects to be discharged to:: Private residence Living Arrangements: Spouse/significant other  Available Help at Discharge: Family Type of Home: House Home Access: Stairs to enter [employer] of Steps: 1/2   Home Layout: Multi-level Alternate Level Stairs-Number of Steps: 1/2 between kitchen and den   [employer] Shower/Tub: Teaching laboratory technician Toilet: Handicapped height     Home Equipment: Environmental consultant - 2 wheels;Cane - single point;Bedside commode;Tub bench          Prior  Functioning/Environment Level of Independence: Needs assistance;Independent with assistive device(s)  Gait / Transfers Assistance Needed: amb with Rw              OT Problem List: Decreased strength;Decreased activity tolerance;Pain;Obesity;Decreased knowledge of precautions;Decreased safety awareness;Impaired balance (sitting and/or standing);Decreased knowledge of use of DME or AE      OT Treatment/Interventions: Self-care/ADL training;Patient/family education;DME and/or AE instruction    OT Goals(Current goals can be found in the care plan section) Acute Rehab OT Goals Patient Stated Goal: to  walk OT Goal Formulation: With patient  OT Frequency: Min 2X/week   Barriers to D/C:               AM-PAC PT "6 Clicks" Daily Activity     Outcome Measure Help from another person eating meals?: None Help from another person taking care of personal grooming?: A Little Help from another person toileting, which includes using toliet, bedpan, or urinal?: Total Help from another person bathing (including washing, rinsing, drying)?: A Lot Help from another person to put on and taking off regular upper body clothing?: A Little Help from another person to put on and taking off regular lower body clothing?: Total 6 Click Score: 14   End of Session Equipment Utilized During Treatment: Rolling walker Nurse Communication: Mobility status  Activity Tolerance: Patient limited by fatigue Patient left: in chair;with call bell/phone within reach  OT Visit Diagnosis: Unsteadiness on feet (R26.81);Other abnormalities of gait and mobility (R26.89);Muscle weakness (generalized) (M62.81);Pain Pain - Right/Left: Left Pain - part of body: Knee                Time: 7711-6579 OT Time Calculation (min): 10 min Charges:  OT General Charges $OT Visit: 1 Visit OT Evaluation $OT Eval Moderate Complexity: 1 Mod OT Treatments $Therapeutic Exercise: 8-22 mins  Kari Baars, OT Acute Rehabilitation  Services [phone] Office- [phone]     [place], Edwena Felty D 08/18/2018, 1:49 PM

## 2018-08-18 NOTE — Progress Notes (Signed)
Physical Therapy Treatment Patient Details Name: Deborah Elliott MRN: [id number] DOB: [birth year] Today's Date: 08/18/2018    History of Present Illness L TKA, R TKA 5 years ago    PT Comments    The patient's mobility required 2 max assist for bed and only ambulated x 5'. Patient complains of left knee pain and feeling nauseated and Hot. Recliner had to be brought up form patient to sit down.  BP 115/71, sats 100%, HR 74. The patient is not progressing well enough to DC safely. Recommend consideration of SNF. Patient's spouse is caregiver and patient currently requires 2 max assist for mobility. RN aware of patient's status. Continue PT.   Follow Up Recommendations  SNF     Equipment Recommendations  Rolling walker with 5" wheels;3in1 (PT)    Recommendations for Other Services       Precautions / Restrictions Precautions Precautions: Knee;Fall Precaution Comments: significant incontinence of urine. Required Braces or Orthoses: Knee Immobilizer - Left Knee Immobilizer - Left: Discontinue once straight leg raise with < 10 degree lag Restrictions Weight Bearing Restrictions: No    Mobility  Bed Mobility Overal bed mobility: Needs Assistance Bed Mobility: Supine to Sit     Supine to sit: Total assist;+2 for physical assistance;+2 for safety/equipment;HOB elevated     General bed mobility comments: patient only moved the left l;eg to edge. Required 2 assist to sit upright and move legs to floor. Patient frequently stating"wait a minute". Patient anxious at times and requires redirection.  Transfers Overall transfer level: Needs assistance Equipment used: Rolling walker (2 wheeled) Transfers: Sit to/from Stand Sit to Stand: Mod assist;+2 physical assistance;+2 safety/equipment;From elevated surface         General transfer comment: Assist to rise from the bed raised. Patient began to let go of RW claiming feeling weak. Recliner brought up, patient assisted to flop into  chair with no control.  Ambulation/Gait Ambulation/Gait assistance: Max assist;+2 physical assistance;+2 safety/equipment Gait Distance (Feet): 5 Feet Assistive device: Rolling walker (2 wheeled) Gait Pattern/deviations: Step-to pattern;Decreased step length - left;Decreased stance time - left;Trunk flexed     General Gait Details: multimodal cues for sequence and posture. Patient began to let go of RW, leaned on counter and doorframe. Cues to remain holding to RW. Patient states" I need to rest my arms' Patient could not safely ambulate any farther, reclioner brought up.   Stairs             Wheelchair Mobility    Modified Rankin (Stroke Patients Only)       Balance           Standing balance support: Bilateral upper extremity supported;During functional activity Standing balance-Leahy Scale: Poor Standing balance comment: requires assist to balance as patient not using RW appropriately, lets go.                            Cognition Arousal/Alertness: Awake/alert Behavior During Therapy: Anxious Overall Cognitive Status: Within Functional Limits for tasks assessed                                 General Comments: frequently states" wait a minute" when moving and experiencing pain         General Comments        Pertinent Vitals/Pain Pain Score: 9  Pain Location: left knee, patient jerks when lateral knee is touched and  popliteal area. Pain Descriptors / Indicators: Grimacing;Guarding;Moaning;Jabbing;Sharp Pain Intervention(s): Monitored during session;Premedicated before session;Ice applied;Limited activity within patient's tolerance    Home Living                      Prior Function            PT Goals (current goals can now be found in the care plan section) Progress towards PT goals: Not progressing toward goals - comment(fatigues easily.)    Frequency    7X/week      PT Plan Current plan remains  appropriate    Co-evaluation              AM-PAC PT "6 Clicks" Daily Activity  Outcome Measure  Difficulty turning over in bed (including adjusting bedclothes, sheets and blankets)?: Unable Difficulty moving from lying on back to sitting on the side of the bed? : Unable Difficulty sitting down on and standing up from a chair with arms (e.g., wheelchair, bedside commode, etc,.)?: Unable Help needed moving to and from a bed to chair (including a wheelchair)?: Total Help needed walking in hospital room?: Total Help needed climbing 3-5 steps with a railing? : Total 6 Click Score: 6    End of Session Equipment Utilized During Treatment: Gait belt;Left knee immobilizer Activity Tolerance: Patient limited by pain;Patient limited by fatigue;Treatment limited secondary to medical complications (Comment) Patient left: in chair;with call bell/phone within reach;with chair alarm set Nurse Communication: Mobility status PT Visit Diagnosis: Unsteadiness on feet (R26.81);Pain Pain - Right/Left: Left Pain - part of body: Knee     Time: 1000-1030 PT Time Calculation (min) (ACUTE ONLY): 30 min  Charges:  $Gait Training: 23-37 mins s                     [place] Pager [phone] Office [phone]    [name] 08/18/2018, 11:43 AM

## 2018-08-18 NOTE — Progress Notes (Signed)
   Subjective: 2 Days Post-Op Procedure(s) (LRB): LEFT TOTAL KNEE ARTHROPLASTY (Left) Patient reports pain as moderate.   Patient seen in rounds with Dr. Wynelle Link. Patient is well, and has had no acute complaints or problems other than pain in the left knee. Denies chest pain, SOB, or calf pain. Voiding without difficulty and positive flatus. No issues overnight.  Plan is to go Home after hospital stay.  Objective: Vital signs in last 24 hours: Temp:  [97.7 F (36.5 C)-98.2 F (36.8 C)] 98.1 F (36.7 C) (10/29 2238) Pulse Rate:  [59-65] 63 (10/30 0657) BP: (139-185)/(68-82) 161/73 (10/30 0657) SpO2:  [96 %-99 %] 97 % (10/30 0657)  Intake/Output from previous day:  Intake/Output Summary (Last 24 hours) at 08/18/2018 0702 Last data filed at 08/17/2018 1629 Gross per 24 hour  Intake 1280.17 ml  Output 400 ml  Net 880.17 ml    Intake/Output this shift: No intake/output data recorded.  Labs: Recent Labs    08/16/18 1137 08/17/18 0521 08/18/18 0511  HGB 13.3 10.6* 10.6*   Recent Labs    08/17/18 0521 08/18/18 0511  WBC 8.3 9.2  RBC 3.57* 3.64*  HCT 33.3* 34.0*  PLT 133* 145*   Recent Labs    08/17/18 0521 08/18/18 0511  NA 139 139  K 4.2 3.5  CL 108 107  CO2 25 27  BUN 11 12  CREATININE 0.51 0.59  GLUCOSE 244* 145*  CALCIUM 8.3* 8.4*   No results for input(s): LABPT, INR in the last 72 hours.  Exam: General - Patient is Alert and Oriented Extremity - Neurologically intact Neurovascular intact Sensation intact distally Dorsiflexion/Plantar flexion intact Dressing/Incision - clean, dry, no drainage Motor Function - intact, moving foot and toes well on exam.   Past Medical History:  Diagnosis Date  . Achilles rupture, left 04/06/2012  . Arthritis of knee   . Cancer ([place])    skin cancer  . Coronary artery disease    X3 STENTS  . Diabetes mellitus without complication ([place])   . Dyspnea   . Dysrhythmia    HX of tachycardia - controlled with med    . GERD (gastroesophageal reflux disease)   . Hx of skin cancer, basal cell   . Hyperlipidemia   . Hypertension   . Leg cramps   . Obesity   . PONV (postoperative nausea and vomiting)   . Sleep apnea    uses c-pap   . Vertigo     Assessment/Plan: 2 Days Post-Op Procedure(s) (LRB): LEFT TOTAL KNEE ARTHROPLASTY (Left) Active Problems:   OA (osteoarthritis) of knee  Estimated body mass index is 39.36 kg/m as calculated from the following:   Height as of this encounter: 5\' 4"  (1.626 m).   Weight as of this encounter: 104 kg. Up with therapy D/C IV fluids  DVT Prophylaxis - Aspirin and Plavix Weight-bearing as tolerated  Will require two sessions of physical therapy today, plan for discharge to home if meeting goals. Scheduled for outpatient physical therapy at [facility] in [place]. Follow-up in the office in 2 weeks with Dr. Wynelle Link.  Theresa Duty, PA-C Orthopedic Surgery 08/18/2018, 7:02 AM

## 2018-08-18 NOTE — Progress Notes (Signed)
Physical Therapy Treatment Patient Details Name: Deborah Elliott MRN: [id number] DOB: [birth year] Today's Date: 08/18/2018    History of Present Illness L TKA, R TKA 5 years ago    PT Comments    Left knee and are noted with edema and bruising. Patient reports significant pain when lateral and popliteal area is touched. Patient tolerating very little knee flexion on L. Will mobilize next visit.    Follow Up Recommendations  SNF     Equipment Recommendations  Rolling walker with 5" wheels;3in1 (PT)    Recommendations for Other Services       Precautions / Restrictions Precautions Precautions: Knee;Fall Precaution Comments: significant incontinence of urine. Required Braces or Orthoses: Knee Immobilizer - Left Knee Immobilizer - Left: Discontinue once straight leg raise with < 10 degree lag Restrictions Weight Bearing Restrictions: No    Mobility  Bed Mobility                  Transfers                    Ambulation/Gait                 Stairs             Wheelchair Mobility    Modified Rankin (Stroke Patients Only)       Balance                                            Cognition Arousal/Alertness: Awake/alert Behavior During Therapy: Restless;Anxious Overall Cognitive Status: Within Functional Limits for tasks assessed                                 General Comments: frequently states" wait a minute" when moving and experiencing pain      Exercises Total Joint Exercises Ankle Circles/Pumps: AROM;Both;10 reps;Supine Quad Sets: AAROM;Both;10 reps;Supine Heel Slides: AAROM;Left;10 reps;Supine Hip ABduction/ADduction: AAROM;Left;10 reps;Supine Straight Leg Raises: AAROM;10 reps;Left;Supine Goniometric ROM: 10-40 left knee flex    General Comments        Pertinent Vitals/Pain Pain Score: 10-Worst pain ever Pain Location: left knee, patient jerks when lateral knee is touched and  popliteal area. Pain Descriptors / Indicators: Grimacing;Guarding;Moaning;Jabbing;Sharp Pain Intervention(s): Limited activity within patient's tolerance;Monitored during session;Premedicated before session;Repositioned    Home Living                      Prior Function            PT Goals (current goals can now be found in the care plan section) Progress towards PT goals: Not progressing toward goals - comment(pain limiting)    Frequency    7X/week      PT Plan Discharge plan needs to be updated    Co-evaluation              AM-PAC PT "6 Clicks" Daily Activity  Outcome Measure  Difficulty turning over in bed (including adjusting bedclothes, sheets and blankets)?: Unable Difficulty moving from lying on back to sitting on the side of the bed? : Unable Difficulty sitting down on and standing up from a chair with arms (e.g., wheelchair, bedside commode, etc,.)?: Unable Help needed moving to and from a bed to chair (including a wheelchair)?: Total Help needed walking in hospital room?:  Total Help needed climbing 3-5 steps with a railing? : Total 6 Click Score: 6    End of Session   Activity Tolerance: Patient limited by pain Patient left: in bed;with call bell/phone within reach Nurse Communication: Mobility status PT Visit Diagnosis: Unsteadiness on feet (R26.81);Pain Pain - Right/Left: Left Pain - part of body: Knee     Time: 3845-3646 PT Time Calculation (min) (ACUTE ONLY): 15 min  Charges:  $Therapeutic Exercise: 8-22 mins                     Tresa Endo PT Acute Rehabilitation Services Pager [phone] Office [phone]    [name] 08/18/2018, 11:35 AM

## 2018-08-18 NOTE — Progress Notes (Signed)
Pt c/o of nausea, relieved by zofran tab.  States she just doesn't feel well.  Discouraged by slow progress with therapy and feels very limited by pain in L knee. Pt with bruising and swelling in knee, painful to touch.  Stated she felt some SOB with therapy, but better with rest. O2 sats 98-100% RA.  PA Drue Dun made aware. RN will monitor.

## 2018-08-18 NOTE — Progress Notes (Signed)
Physical Therapy Treatment Patient Details Name: Deborah Elliott MRN: [id number] DOB: 1946/02/07 Today's Date: 08/18/2018    History of Present Illness L TKA, R TKA 5 years ago    PT Comments    Patient disd ambulate 53' with improved performance. Patient continue to require 2 assist for mobility.  Left knee is edematous and patient does not tolerate pressure on it. Decreased tolerance to flexion. Continue PT.  Follow Up Recommendations  SNF     Equipment Recommendations  Rolling walker with 5" wheels;3in1 (PT)    Recommendations for Other Services       Precautions / Restrictions Precautions Precautions: Knee;Fall Precaution Comments: significant incontinence of urine. Required Braces or Orthoses: Knee Immobilizer - Left Knee Immobilizer - Left: Discontinue once straight leg raise with < 10 degree lag    Mobility  Bed Mobility Overal bed mobility: Needs Assistance Bed Mobility: Sit to Supine     Supine to sit: Total assist;+2 for physical assistance;+2 for safety/equipment;HOB elevated Sit to supine: Mod assist;+2 for safety/equipment   General bed mobility comments: assist with both legs onto the bed, trunk flopped back onto bed.  Transfers Overall transfer level: Needs assistance Equipment used: Rolling walker (2 wheeled) Transfers: Sit to/from Stand Sit to Stand: +2 safety/equipment;+2 physical assistance;Max assist         General transfer comment: assist to rise from the recliner, cues to reach back to bed. decreased control of descent  Ambulation/Gait Ambulation/Gait assistance: Mod assist;+2 physical assistance;+2 safety/equipment Gait Distance (Feet): 10 Feet Assistive device: Rolling walker (2 wheeled) Gait Pattern/deviations: Step-to pattern;Decreased step length - left;Decreased stance time - left;Trunk flexed;Antalgic     General Gait Details: multimodal cues for sequence and posture. Patient did improve with stepping, required extra time for  each step and cues for posture. Patient is quite fatigued.    Stairs             Wheelchair Mobility    Modified Rankin (Stroke Patients Only)       Balance           Standing balance support: Bilateral upper extremity supported;During functional activity Standing balance-Leahy Scale: Poor Standing balance comment: requires assist to balance as patient not using RW appropriately- had hands in wrong spot even with VC                            Cognition Arousal/Alertness: Awake/alert Behavior During Therapy: Anxious Overall Cognitive Status: Within Functional Limits for tasks assessed                                 General Comments: frequently states" wait a minute" when moving and experiencing pain      Exercises Total Joint Exercises Ankle Circles/Pumps: AROM;Both;10 reps;Supine Quad Sets: AAROM;Both;10 reps;Supine Heel Slides: AAROM;Left;Supine;20 reps Hip ABduction/ADduction: AAROM;Left;10 reps;Supine Straight Leg Raises: AAROM;10 reps;Left;Supine Goniometric ROM: 10-40 left knee flex    General Comments        Pertinent Vitals/Pain Pain Score: 8  Pain Location: L knee with pressure around the knee and with bending Pain Descriptors / Indicators: Grimacing;Guarding;Moaning;Jabbing;Sharp Pain Intervention(s): Monitored during session;Patient requesting pain meds-RN notified;Limited activity within patient's tolerance;Ice applied    Home Living Family/patient expects to be discharged to:: Private residence Living Arrangements: Spouse/significant other Available Help at Discharge: Family Type of Home: House Home Access: Stairs to enter   Home Layout: Multi-level Home Equipment:  Walker - 2 wheels;Cane - single point;Bedside commode;Tub bench      Prior Function Level of Independence: Needs assistance;Independent with assistive device(s)  Gait / Transfers Assistance Needed: amb with Rw       PT Goals (current goals can  now be found in the care plan section) Acute Rehab PT Goals Patient Stated Goal: to  walk Progress towards PT goals: Progressing toward goals(a little bit  today)    Frequency    7X/week      PT Plan Current plan remains appropriate    Co-evaluation              AM-PAC PT "6 Clicks" Daily Activity  Outcome Measure  Difficulty turning over in bed (including adjusting bedclothes, sheets and blankets)?: Unable Difficulty moving from lying on back to sitting on the side of the bed? : Unable Difficulty sitting down on and standing up from a chair with arms (e.g., wheelchair, bedside commode, etc,.)?: Unable Help needed moving to and from a bed to chair (including a wheelchair)?: Total Help needed walking in hospital room?: Total Help needed climbing 3-5 steps with a railing? : Total 6 Click Score: 6    End of Session Equipment Utilized During Treatment: Gait belt;Left knee immobilizer Activity Tolerance: Patient limited by pain;Patient limited by fatigue;Treatment limited secondary to medical complications (Comment) Patient left: in bed;with call bell/phone within reach;with family/visitor present Nurse Communication: Mobility status PT Visit Diagnosis: Unsteadiness on feet (R26.81);Pain Pain - Right/Left: Left Pain - part of body: Knee     Time: 1345-1418 PT Time Calculation (min) (ACUTE ONLY): 33 min  Charges:  $Gait Training: 8-22 mins $Therapeutic Exercise: 8-22 mins                     Tresa Endo PT Acute Rehabilitation Services Pager [phone] Office [phone]    [name] 08/18/2018, 2:34 PM

## 2018-08-19 LAB — CBC
HCT: 31.5 % — ABNORMAL LOW (ref 36.0–46.0)
Hemoglobin: 10.2 g/dL — ABNORMAL LOW (ref 12.0–15.0)
MCH: 30 pg (ref 26.0–34.0)
MCHC: 32.4 g/dL (ref 30.0–36.0)
MCV: 92.6 fL (ref 80.0–100.0)
Platelets: 130 10*3/uL — ABNORMAL LOW (ref 150–400)
RBC: 3.4 MIL/uL — ABNORMAL LOW (ref 3.87–5.11)
RDW: 13.5 % (ref 11.5–15.5)
WBC: 7.8 10*3/uL (ref 4.0–10.5)
nRBC: 0 % (ref 0.0–0.2)

## 2018-08-19 MED ORDER — METHOCARBAMOL 500 MG PO TABS: 500.0000 mg | ORAL_TABLET | Freq: Four times a day (QID) | ORAL | 0 refills | Status: DC | PRN

## 2018-08-19 MED ORDER — OXYCODONE HCL 5 MG PO TABS: 5.0000 mg | ORAL_TABLET | Freq: Four times a day (QID) | ORAL | 0 refills | Status: DC | PRN

## 2018-08-19 MED ORDER — TRAMADOL HCL 50 MG PO TABS: 50.0000 mg | ORAL_TABLET | Freq: Four times a day (QID) | ORAL | 0 refills | Status: DC | PRN

## 2018-08-19 MED ORDER — ASPIRIN 325 MG PO TBEC: 325.0000 mg | DELAYED_RELEASE_TABLET | Freq: Every day | ORAL | 0 refills | Status: DC

## 2018-08-19 NOTE — Progress Notes (Addendum)
CSW met with pt at bedside for disposition planning- pt reports she has been hoping/planning to be able to DC home and manage recovery/therapy there, however upon her sessions yesterday and today, husband was present and pt realized her care and therapy are more than can be handled at home. Agreeable to SNF placement for rehab- prefers [facility]. Obtained PASRR, completed FL2 and made referrals there and to other area facilities to investigate placement options. Explained [facility] Medicare prior authorization requirements- pt understanding. Once beds offered and facility selected, SNF can initiate authorization request.  Sharren Bridge, MSW, LCSW Clinical Social Work 08/19/2018 [phone] coverage for [phone]  13:52 update- [facility] no available beds- provided pt, daughter, and husband with list of available facilities and they selected [facility]. CSW confirmed with admissions. Facility initiating insurance authorization request.

## 2018-08-19 NOTE — Progress Notes (Addendum)
Physical Therapy Treatment Patient Details Name: Deborah Elliott MRN: [id number] DOB: [birth year] Today's Date: 08/19/2018    History of Present Illness L TKA, R TKA 5 years ago    PT Comments    The patient tolertaed increased ROM in left knee today.left knee  And surrounding area remain edematous and bruised. At times the patient really jerks with shrp left knee pain during There ex. Continue with  Mobility next visit.   Follow Up Recommendations  SNF     Equipment Recommendations  Rolling walker with 5" wheels;3in1 (PT)    Recommendations for Other Services       Precautions / Restrictions Precautions Precautions: Knee;Fall Precaution Comments: significant incontinence of urine. Required Braces or Orthoses: Knee Immobilizer - Left Knee Immobilizer - Left: Discontinue once straight leg raise with < 10 degree lag Restrictions Weight Bearing Restrictions: No    Mobility  Ambulation/Gait                 Stairs             Wheelchair Mobility    Modified Rankin (Stroke Patients Only)       Balance                                            Cognition Arousal/Alertness: Awake/alert Behavior During Therapy: WFL for tasks assessed/performed Overall Cognitive Status: Within Functional Limits for tasks assessed                                      Exercises Total Joint Exercises Ankle Circles/Pumps: AROM;Both;10 reps;Supine Quad Sets: AAROM;Both;10 reps;Supine Heel Slides: AAROM;Left;Supine;20 reps Hip ABduction/ADduction: AAROM;Left;Supine;15 reps Straight Leg Raises: AAROM;10 reps;Left;Supine Goniometric ROM: 10-50  lft knee flexion Other Exercises Other Exercises: provided level one theraband, 5 reps x horiziontal ABD, 5 x FF each shoulder and 5 x biceps each elbow    General Comments        Pertinent Vitals/Pain Pain Score: 2  Pain Location: L knee  Pain Descriptors / Indicators: Sore Pain  Intervention(s): Monitored during session    Home Living                      Prior Function            PT Goals (current goals can now be found in the care plan section) Acute Rehab PT Goals Patient Stated Goal: to  walk Progress towards PT goals: Progressing toward goals(much improved today)    Frequency    7X/week      PT Plan Current plan remains appropriate    Co-evaluation   Reason for Co-Treatment: For patient/therapist safety PT goals addressed during session: Mobility/safety with mobility OT goals addressed during session: ADL's and self-care      AM-PAC PT "6 Clicks" Daily Activity  Outcome Measure  Difficulty turning over in bed (including adjusting bedclothes, sheets and blankets)?: Unable Difficulty moving from lying on back to sitting on the side of the bed? : Unable Difficulty sitting down on and standing up from a chair with arms (e.g., wheelchair, bedside commode, etc,.)?: Unable Help needed moving to and from a bed to chair (including a wheelchair)?: Total Help needed walking in hospital room?: Total Help needed climbing 3-5 steps with a railing? : Total 6  Click Score: 6    End of Session   Activity Tolerance: Patient tolerated treatment well Patient left: in bed;with call bell/phone within reach Nurse Communication: Mobility status PT Visit Diagnosis: Unsteadiness on feet (R26.81) Pain - Right/Left: Left Pain - part of body: Knee     Time: 3744-5146  PT Time Calculation (min) (ACUTE ONLY): 19 min  Charges:  $Therapeutic Exercise: 8-73mins                     [place] Pager [phone] Office [phone]    [name] 08/19/2018, 12:30 PM

## 2018-08-19 NOTE — Progress Notes (Signed)
Pt states that she will self administer CPAP when ready for bed.  RT to monitor and assess as needed.  

## 2018-08-19 NOTE — Progress Notes (Signed)
RN and NT assisted pt to stand with walker in order to change Purewick, gown, and pad underneath pt in chair. Pt very weak and deconditioned with very little upper body strength. Pt needed max assist x 2 just to stand briefly.

## 2018-08-19 NOTE — NC FL2 (Signed)
[place] LEVEL OF CARE SCREENING TOOL     IDENTIFICATION  Patient Name: Deborah Elliott Birthdate: [birth year] Sex: female Admission Date (Current Location): 08/16/2018  [facility] and [place] Number:  Engineer, manufacturing systems and Address:  [facility],  [place] [address] [place]      Provider Number: [id number]  Attending Physician Name and Address:  Derl Barrow, PA  Relative Name and Phone Number:       Current Level of Care: Hospital Recommended Level of Care: [place] Prior Approval Number:    Date Approved/Denied:   PASRR Number: [id number]  Discharge Plan: SNF    Current Diagnoses: Patient Active Problem List   Diagnosis Date Noted  . Postoperative anemia due to acute blood loss 08/30/2013  . OA (osteoarthritis) of knee 08/29/2013  . Coronary artery disease 03/24/2013  . Obesity (BMI 35.0-39.9 without comorbidity) 03/24/2013  . Essential hypertension 03/24/2013  . Dyslipidemia 03/24/2013    Orientation RESPIRATION BLADDER Height & Weight     Self, Time, Situation, Place  Normal Incontinent Weight: 229 lb 4.5 oz (104 kg) Height:  5\' 4"  (162.6 cm)  BEHAVIORAL SYMPTOMS/MOOD NEUROLOGICAL BOWEL NUTRITION STATUS      Continent Diet(low sodium heart healthy)  AMBULATORY STATUS COMMUNICATION OF NEEDS Skin   Extensive Assist Verbally Surgical wounds(left knee closed incision- gauze )                       Personal Care Assistance Level of Assistance  Bathing, Feeding, Dressing Bathing Assistance: Maximum assistance Feeding assistance: Independent Dressing Assistance: Maximum assistance     Functional Limitations Info  Sight, Hearing, Speech Sight Info: Adequate Hearing Info: Adequate Speech Info: Adequate    SPECIAL CARE FACTORS FREQUENCY  PT (By licensed PT), OT (By licensed OT)     PT Frequency: 5x OT Frequency: 5x            Contractures Contractures Info: Not present     Additional Factors Info  Code Status, Allergies Code Status Info: full code Allergies Info: Morphine And Related, Latex, Sulfur, Tape           Current Medications (08/19/2018):  This is the current hospital active medication list Current Facility-Administered Medications  Medication Dose Route Frequency Provider Last Rate Last Dose  . 0.9 %  sodium chloride infusion   Intravenous Continuous Gaynelle Arabian, MD 75 mL/hr at 08/17/18 1300    . aspirin EC tablet 325 mg  325 mg Oral Q breakfast Gaynelle Arabian, MD   325 mg at 08/19/18 0813  . bisacodyl (DULCOLAX) suppository 10 mg  10 mg Rectal Daily PRN Aluisio, Pilar Plate, MD      . clopidogrel (PLAVIX) tablet 75 mg  75 mg Oral Daily Gaynelle Arabian, MD   75 mg at 08/19/18 1052  . diphenhydrAMINE (BENADRYL) 12.5 MG/5ML elixir 12.5-25 mg  12.5-25 mg Oral Q4H PRN Aluisio, Pilar Plate, MD      . docusate sodium (COLACE) capsule 100 mg  100 mg Oral BID Gaynelle Arabian, MD   100 mg at 08/19/18 1052  . furosemide (LASIX) tablet 20 mg  20 mg Oral Daily Gaynelle Arabian, MD   20 mg at 08/19/18 1053  . HYDROmorphone (DILAUDID) injection 0.5-1 mg  0.5-1 mg Intravenous Q4H PRN Aluisio, Pilar Plate, MD      . meclizine (ANTIVERT) tablet 25 mg  25 mg Oral BID PRN Gaynelle Arabian, MD      . menthol-cetylpyridinium (CEPACOL) lozenge 3 mg  1 lozenge Oral PRN Gaynelle Arabian, MD       Or  . phenol (CHLORASEPTIC) mouth spray 1 spray  1 spray Mouth/Throat PRN Aluisio, Pilar Plate, MD      . methocarbamol (ROBAXIN) tablet 500 mg  500 mg Oral Q6H PRN Gaynelle Arabian, MD   500 mg at 08/19/18 1047   Or  . methocarbamol (ROBAXIN) 500 mg in dextrose 5 % 50 mL IVPB  500 mg Intravenous Q6H PRN Gaynelle Arabian, MD 100 mL/hr at 08/16/18 1521 500 mg at 08/16/18 1521  . metoCLOPramide (REGLAN) tablet 5-10 mg  5-10 mg Oral Q8H PRN Gaynelle Arabian, MD       Or  . metoCLOPramide (REGLAN) injection 5-10 mg  5-10 mg Intravenous Q8H PRN Aluisio, Pilar Plate, MD      . metoprolol succinate (TOPROL-XL) 24 hr  tablet 50 mg  50 mg Oral Daily Gaynelle Arabian, MD   50 mg at 08/19/18 1052  . ondansetron (ZOFRAN) tablet 4 mg  4 mg Oral Q6H PRN Gaynelle Arabian, MD   4 mg at 08/18/18 1130   Or  . ondansetron (ZOFRAN) injection 4 mg  4 mg Intravenous Q6H PRN Aluisio, Pilar Plate, MD      . oxyCODONE (Oxy IR/ROXICODONE) immediate release tablet 5-10 mg  5-10 mg Oral Q4H PRN Gaynelle Arabian, MD   10 mg at 08/19/18 1047  . pantoprazole (PROTONIX) EC tablet 40 mg  40 mg Oral Daily PRN Aluisio, Pilar Plate, MD      . polyethylene glycol (MIRALAX / GLYCOLAX) packet 17 g  17 g Oral Daily PRN Aluisio, Pilar Plate, MD      . potassium chloride SA (K-DUR,KLOR-CON) CR tablet 20 mEq  20 mEq Oral Daily Gaynelle Arabian, MD   20 mEq at 08/19/18 1052  . rosuvastatin (CRESTOR) tablet 20 mg  20 mg Oral QPM Gaynelle Arabian, MD   20 mg at 08/18/18 1752  . sodium phosphate (FLEET) 7-19 GM/118ML enema 1 enema  1 enema Rectal Once PRN Aluisio, Pilar Plate, MD      . traMADol Veatrice Bourbon) tablet 50-100 mg  50-100 mg Oral Q6H PRN Gaynelle Arabian, MD         Discharge Medications: Please see discharge summary for a list of discharge medications.  Relevant Imaging Results:  Relevant Lab Results:   Additional Information SS# [id number]  Nila Nephew, LCSW

## 2018-08-19 NOTE — Progress Notes (Signed)
Physical Therapy Treatment Patient Details Name: Deborah Elliott MRN: [id number] DOB: [birth year] Today's Date: 08/19/2018    History of Present Illness L TKA, R TKA 5 years ago    PT Comments    patient is progress ing today. Continues to require 2 assist for mobility. Patient is incontinent of  Urine almost every time standing up. Patient agreeable to Rehab post acute as progress is slower than anticipated.  Follow Up Recommendations  SNF     Equipment Recommendations  Rolling walker with 5" wheels;3in1 (PT)    Recommendations for Other Services       Precautions / Restrictions Precautions Precautions: Knee;Fall Precaution Comments: significant incontinence of urine. Required Braces or Orthoses: Knee Immobilizer - Left Knee Immobilizer - Left: Discontinue once straight leg raise with < 10 degree lag Restrictions Weight Bearing Restrictions: No    Mobility  Bed Mobility   Bed Mobility: Supine to Sit     Supine to sit: Mod assist;+2 for physical assistance;+2 for safety/equipment;HOB elevated     General bed mobility comments: assist with both legs , use of rail and HOB raised. Extra time  Transfers Overall transfer level: Needs assistance Equipment used: Rolling walker (2 wheeled) Transfers: Sit to/from Stand Sit to Stand: Mod assist;+2 physical assistance;+2 safety/equipment Stand pivot transfers: Mod assist;+2 physical assistance;+2 safety/equipment       General transfer comment: assist to rise and stabilize.  Used shoes and KI for transfer, Unable to ambulate due to incontinence of  urine so BSC brought uop. Able to stand and pivot using RW and 2 assist to [facility] then steps to get to recliner/  Ambulation/Gait                 Stairs             Wheelchair Mobility    Modified Rankin (Stroke Patients Only)       Balance                                            Cognition Arousal/Alertness: Awake/alert Behavior  During Therapy: WFL for tasks assessed/performed Overall Cognitive Status: Within Functional Limits for tasks assessed                                 General Comments: pt having more difficulty sustaining attention to UE theraband exercises:  max cues given.  Written HEP provided      Exercises Total Joint Exercises Ankle Circles/Pumps: AROM;Both;10 reps;Supine Quad Sets: AAROM;Both;10 reps;Supine Heel Slides: AAROM;Left;Supine;20 reps Hip ABduction/ADduction: AAROM;Left;Supine;15 reps Straight Leg Raises: AAROM;10 reps;Left;Supine Goniometric ROM: 10-50  lft knee flexion Other Exercises Other Exercises: provided level one theraband, 5 reps x horiziontal ABD, 5 x FF each shoulder and 5 x biceps each elbow    General Comments        Pertinent Vitals/Pain Pain Score: 2  Pain Location: L knee when sitting in chair Pain Descriptors / Indicators: Sore Pain Intervention(s): Monitored during session;Ice applied    Home Living                      Prior Function            PT Goals (current goals can now be found in the care plan section) Acute Rehab PT Goals Patient Stated Goal: to  walk Progress towards PT goals: Progressing toward goals    Frequency    7X/week      PT Plan Current plan remains appropriate    Co-evaluation PT/OT/SLP Co-Evaluation/Treatment: Yes Reason for Co-Treatment: For patient/therapist safety PT goals addressed during session: Mobility/safety with mobility OT goals addressed during session: ADL's and self-care      AM-PAC PT "6 Clicks" Daily Activity  Outcome Measure  Difficulty turning over in bed (including adjusting bedclothes, sheets and blankets)?: Unable Difficulty moving from lying on back to sitting on the side of the bed? : Unable Difficulty sitting down on and standing up from a chair with arms (e.g., wheelchair, bedside commode, etc,.)?: Unable Help needed moving to and from a bed to chair (including a  wheelchair)?: Total Help needed walking in hospital room?: Total Help needed climbing 3-5 steps with a railing? : Total 6 Click Score: 6    End of Session Equipment Utilized During Treatment: Left knee immobilizer Activity Tolerance: Patient tolerated treatment well Patient left: in chair;with call bell/phone within reach Nurse Communication: Mobility status PT Visit Diagnosis: Unsteadiness on feet (R26.81) Pain - Right/Left: Left Pain - part of body: Knee     Time: 9470-9628 PT Time Calculation (min) (ACUTE ONLY): 27 min  Charges:   $Therapeutic Activity: 8-22 mins                     Tresa Endo PT Acute Rehabilitation Services Pager [phone] Office [phone]    [name] 08/19/2018, 12:40 PM

## 2018-08-19 NOTE — Progress Notes (Signed)
   08/19/18 1100  OT Visit Information  Last OT Received On 08/19/18  Assistance Needed +2  History of Present Illness L TKA, R TKA 5 years ago  Precautions  Precautions Knee;Fall  Precaution Comments significant incontinence of urine.  Required Braces or Orthoses Knee Immobilizer - Left  Pain Assessment  Pain Score 2  Pain Location L knee when sitting in chair  Pain Descriptors / Indicators Sore  Pain Intervention(s) Monitored during session  Cognition  Arousal/Alertness Awake/alert  Behavior During Therapy WFL for tasks assessed/performed  General Comments pt having more difficulty sustaining attention to UE theraband exercises:  max cues given.  Written HEP provided  Upper Extremity Assessment  Upper Extremity Assessment Generalized weakness  Restrictions  Weight Bearing Restrictions No  Exercises  Exercises Other exercises  Other Exercises  Other Exercises provided level one theraband, 5 reps x horiziontal ABD, 5 x FF each shoulder and 5 x biceps each elbow  OT - End of Session  Activity Tolerance Patient limited by fatigue  Patient left in chair;with call bell/phone within reach  OT Assessment/Plan  OT Visit Diagnosis Unsteadiness on feet (R26.81);Other abnormalities of gait and mobility (R26.89);Muscle weakness (generalized) (M62.81);Pain  Pain - Right/Left Left  Pain - part of body Knee  Follow Up Recommendations SNF;Follow surgeon's recommendation for DC plan and follow-up therapies  OT Equipment None recommended by OT  AM-PAC OT "6 Clicks" Daily Activity Outcome Measure  Help from another person eating meals? 4  Help from another person taking care of personal grooming? 3  Help from another person toileting, which includes using toliet, bedpan, or urinal? 2  Help from another person bathing (including washing, rinsing, drying)? 2  Help from another person to put on and taking off regular upper body clothing? 3  Help from another person to put on and taking off  regular lower body clothing? 1  6 Click Score 15  ADL G Code Conversion CK  OT Time Calculation  OT Start Time (ACUTE ONLY) 1118  OT Stop Time (ACUTE ONLY) 1136  OT Time Calculation (min) 18 min  OT General Charges  $OT Visit 1 Visit  OT Treatments  $Therapeutic Exercise 8-22 mins  added there ex goal Lesle Chris, OTR/L Acute Rehabilitation Services [phone] [place] pager [phone] office 08/19/2018

## 2018-08-19 NOTE — Progress Notes (Signed)
   Subjective: 3 Days Post-Op Procedure(s) (LRB): LEFT TOTAL KNEE ARTHROPLASTY (Left) Patient reports pain as moderate.   Patient seen in rounds with Dr. Wynelle Link. Patient is well, and has had no acute complaints or problems other than pain in the left knee. Voiding without difficulty and positive flatus. Denies chest pain, SOB, or calf pain. No issues overnight.  Plan is to go Home after hospital stay.  Objective: Vital signs in last 24 hours: Temp:  [97.5 F (36.4 C)-99.2 F (37.3 C)] 98.6 F (37 C) (10/31 0456) Pulse Rate:  [64-81] 81 (10/31 0456) Resp:  [16-20] 16 (10/31 0456) BP: (100-164)/(67-92) 155/69 (10/31 0456) SpO2:  [91 %-98 %] 93 % (10/31 0456)  Intake/Output from previous day:  Intake/Output Summary (Last 24 hours) at 08/19/2018 0817 Last data filed at 08/19/2018 0624 Gross per 24 hour  Intake 600 ml  Output 350 ml  Net 250 ml    Labs: Recent Labs    08/16/18 1137 08/17/18 0521 08/18/18 0511 08/19/18 0525  HGB 13.3 10.6* 10.6* 10.2*   Recent Labs    08/18/18 0511 08/19/18 0525  WBC 9.2 7.8  RBC 3.64* 3.40*  HCT 34.0* 31.5*  PLT 145* 130*   Recent Labs    08/17/18 0521 08/18/18 0511  NA 139 139  K 4.2 3.5  CL 108 107  CO2 25 27  BUN 11 12  CREATININE 0.51 0.59  GLUCOSE 244* 145*  CALCIUM 8.3* 8.4*   Exam: General - Patient is Alert and Oriented Extremity - Neurologically intact Neurovascular intact Sensation intact distally Dorsiflexion/Plantar flexion intact Dressing/Incision - clean, dry, no drainage Motor Function - intact, moving foot and toes well on exam.   Past Medical History:  Diagnosis Date  . Achilles rupture, left 04/06/2012  . Arthritis of knee   . Cancer ([place])    skin cancer  . Coronary artery disease    X3 STENTS  . Diabetes mellitus without complication ([place])   . Dyspnea   . Dysrhythmia    HX of tachycardia - controlled with med  . GERD (gastroesophageal reflux disease)   . Hx of skin cancer, basal cell     . Hyperlipidemia   . Hypertension   . Leg cramps   . Obesity   . PONV (postoperative nausea and vomiting)   . Sleep apnea    uses c-pap   . Vertigo     Assessment/Plan: 3 Days Post-Op Procedure(s) (LRB): LEFT TOTAL KNEE ARTHROPLASTY (Left) Active Problems:   OA (osteoarthritis) of knee  Estimated body mass index is 39.36 kg/m as calculated from the following:   Height as of this encounter: 5\' 4"  (1.626 m).   Weight as of this encounter: 104 kg. Up with therapy D/C IV fluids  DVT Prophylaxis - Aspirin and Plavix Weight-bearing as tolerated  Plan for discharge to home today after two sessions of therapy. Due to slowed ambulation, she will require home health physical therapy for 2 weeks prior to transitioning to outpatient therapy. Ordered placed for consult. Follow-up in the office in 2 weeks with Dr. Wynelle Link.  Theresa Duty, PA-C Orthopedic Surgery 08/19/2018, 8:17 AM

## 2018-08-19 NOTE — Care Management Important Message (Signed)
Important Message  Patient Details  Name: SHELDA TRUBY MRN: [id number] Date of Birth: 12/28/1945   Medicare Important Message Given:  Yes    Kerin Salen 08/19/2018, 1:39 [place] Message  Patient Details  Name: SOSIE GATO MRN: [id number] Date of Birth: 1946/05/17   Medicare Important Message Given:  Yes    Kerin Salen 08/19/2018, 1:39 PM

## 2018-08-19 NOTE — Progress Notes (Signed)
Physical Therapy Treatment Patient Details Name: INFINITI HOEFLING MRN: [id number] DOB: 10/22/1945 Today's Date: 08/19/2018    History of Present Illness L TKA, R TKA 5 years ago    PT Comments    The patient is very fatigued and was unable to stand and pivot with RW this visit. @ assist to pivot to bed. Plans SNF for rehab.    Follow Up Recommendations  SNF     Equipment Recommendations  Rolling walker with 5" wheels;3in1 (PT)    Recommendations for Other Services       Precautions / Restrictions Precautions Precautions: Knee;Fall Precaution Comments: significant incontinence of urine. Required Braces or Orthoses: Knee Immobilizer - Left Knee Immobilizer - Left: Discontinue once straight leg raise with < 10 degree lag    Mobility  Bed Mobility Overal bed mobility: Needs Assistance Bed Mobility: Sit to Supine     Supine to sit: Max assist;+2 for physical assistance;+2 for safety/equipment     General bed mobility comments: assist with both legs ,patient is very fatigued and was unable to assist with bed mobility.  Transfers   Equipment used: Conservation officer, nature (2 wheeled) Transfers: Sit to/from [employer] transfer comment: patient attempted to stand from the recliner x 3 and unable to fully stand with 2 max/total assit. Patient did not get the right knee straight. With +2 arm hold, lifted patient from recliner and pivoted to bed.  Ambulation/Gait             General Gait Details: unable.   Stairs             Wheelchair Mobility    Modified Rankin (Stroke Patients Only)       Balance                                            Cognition Arousal/Alertness: Awake/alert                                            Exercises      General Comments        Pertinent Vitals/Pain Pain Location: L knee when sitting in chair Pain Descriptors / Indicators: Sore;Aching Pain  Intervention(s): Premedicated before session;Monitored during session;Ice applied    Home Living                      Prior Function            PT Goals (current goals can now be found in the care plan section) Progress towards PT goals: Not progressing toward goals - comment(very weak today.)    Frequency    7X/week      PT Plan Current plan remains appropriate    Co-evaluation              AM-PAC PT "6 Clicks" Daily Activity  Outcome Measure  Difficulty turning over in bed (including adjusting bedclothes, sheets and blankets)?: Unable Difficulty moving from lying on back to sitting on the side of the bed? : Unable Difficulty sitting down on and standing up from a chair with arms (e.g., wheelchair, bedside commode, etc,.)?: Unable Help needed moving to and from a bed to chair (including a  wheelchair)?: Total Help needed walking in hospital room?: Total Help needed climbing 3-5 steps with a railing? : Total 6 Click Score: 6    End of Session         PT Visit Diagnosis: Unsteadiness on feet (R26.81) Pain - Right/Left: Left Pain - part of body: Knee     Time: 2536-6440 PT Time Calculation (min) (ACUTE ONLY): 19 min  Charges:  $Therapeutic Activity: 8-22 mins                     Tresa Endo PT Acute Rehabilitation Services Pager [phone] Office [phone]    [name] 08/19/2018, 5:27 PM

## 2018-08-19 NOTE — Progress Notes (Signed)
Occupational Therapy Treatment Patient Details Name: Deborah Elliott MRN: [id number] DOB: 04-23-1946 Today's Date: 08/19/2018    History of present illness L TKA, R TKA 5 years ago   OT comments  Pt with incontinence when standing.  Feel pt needs SNF as husband is primary caregiver and he will only have intermittent support.  Pt needs +2 for safety, especially with urine getting on floor.  Follow Up Recommendations  SNF;Follow surgeon's recommendation for DC plan and follow-up therapies    Equipment Recommendations  None recommended by OT    Recommendations for Other Services      Precautions / Restrictions Precautions Precautions: Knee;Fall Precaution Comments: significant incontinence of urine. Required Braces or Orthoses: Knee Immobilizer - Left Restrictions Weight Bearing Restrictions: No       Mobility Bed Mobility         Supine to sit: Mod assist;+2 for physical assistance;+2 for safety/equipment;HOB elevated        Transfers   Equipment used: Rolling walker (2 wheeled)   Sit to Stand: Mod assist;+2 physical assistance;+2 safety/equipment Stand pivot transfers: Mod assist;+2 physical assistance;+2 safety/equipment       General transfer comment: assist to rise and stabilize.  Used shoes and KI for transfer    Balance                                           ADL either performed or assessed with clinical judgement   ADL                      LB bathing/dressing, Total A +2     Toilet Transfer: Moderate assistance;+2 for physical assistance;+2 for safety/equipment;Stand-pivot;BSC;RW   Toileting- Clothing Manipulation and Hygiene: Total assistance;+2 for physical assistance;+2 for safety/equipment;Sit to/from stand         General ADL Comments: Pt incontinent x urine when standing; replaced socks and pad used with underwear. She may need to use pull ups at home     Vision       Perception     Praxis       Cognition Arousal/Alertness: Awake/alert Behavior During Therapy: WFL for tasks assessed/performed Overall Cognitive Status: Within Functional Limits for tasks assessed                                          Exercises     Shoulder Instructions       General Comments      Pertinent Vitals/ Pain       Pain Score: 8  Pain Location: L knee with pressure around the knee and with bending Pain Descriptors / Indicators: Grimacing;Guarding;Moaning;Jabbing;Sharp Pain Intervention(s): Limited activity within patient's tolerance;Monitored during session;Premedicated before session;Repositioned;Ice applied  Home Living                                          Prior Functioning/Environment              Frequency           Progress Toward Goals  OT Goals(current goals can now be found in the care plan section)  Progress towards OT goals: (slow progress)  Acute Rehab OT  Goals Patient Stated Goal: to  walk  Plan      Co-evaluation    PT/OT/SLP Co-Evaluation/Treatment: Yes Reason for Co-Treatment: For patient/therapist safety PT goals addressed during session: Mobility/safety with mobility OT goals addressed during session: ADL's and self-care      AM-PAC PT "6 Clicks" Daily Activity     Outcome Measure   Help from another person eating meals?: None Help from another person taking care of personal grooming?: A Little Help from another person toileting, which includes using toliet, bedpan, or urinal?: A Lot Help from another person bathing (including washing, rinsing, drying)?: A Lot Help from another person to put on and taking off regular upper body clothing?: A Little Help from another person to put on and taking off regular lower body clothing?: Total 6 Click Score: 15    End of Session    OT Visit Diagnosis: Unsteadiness on feet (R26.81);Other abnormalities of gait and mobility (R26.89);Muscle weakness (generalized)  (M62.81);Pain Pain - Right/Left: Left Pain - part of body: Knee   Activity Tolerance Patient limited by fatigue   Patient Left in chair;with call bell/phone within reach   Nurse Communication          Time: 2683-4196 OT Time Calculation (min): 32 min  Charges: OT General Charges $OT Visit: 1 Visit OT Treatments $Self Care/Home Management : 8-22 mins  Lesle Chris, OTR/L Acute Rehabilitation Services [phone] [facility] pager [phone] office 08/19/2018   Deborah Elliott 08/19/2018, 10:23 AM

## 2018-08-20 ENCOUNTER — Ambulatory Visit: Payer: Medicare Other | Admitting: Physical Therapy

## 2018-08-20 MED ORDER — ONDANSETRON HCL 4 MG PO TABS: 4.0000 mg | ORAL_TABLET | Freq: Four times a day (QID) | ORAL | 0 refills | Status: DC | PRN

## 2018-08-20 MED ORDER — DOCUSATE SODIUM 100 MG PO CAPS: 100.0000 mg | ORAL_CAPSULE | Freq: Two times a day (BID) | ORAL | 0 refills | Status: DC

## 2018-08-20 MED ORDER — POLYETHYLENE GLYCOL 3350 17 G PO PACK: 17.0000 g | PACK | Freq: Every day | ORAL | 0 refills | Status: DC | PRN

## 2018-08-20 NOTE — Discharge Summary (Signed)
Physician Discharge Summary   Patient ID: JAELYNNE HOCKLEY MRN: [id number] DOB/AGE: 1946/05/11 72 y.o.  Admit date: 08/16/2018 Discharge date:   Primary Diagnosis: Osteoarthritis, left knee   Admission Diagnoses:  Past Medical History:  Diagnosis Date  . Achilles rupture, left 04/06/2012  . Arthritis of knee   . Cancer ([place])    skin cancer  . Coronary artery disease    X3 STENTS  . Diabetes mellitus without complication ([place])   . Dyspnea   . Dysrhythmia    HX of tachycardia - controlled with med  . GERD (gastroesophageal reflux disease)   . Hx of skin cancer, basal cell   . Hyperlipidemia   . Hypertension   . Leg cramps   . Obesity   . PONV (postoperative nausea and vomiting)   . Sleep apnea    uses c-pap   . Vertigo    Discharge Diagnoses:   Active Problems:   OA (osteoarthritis) of knee  Estimated body mass index is 39.36 kg/m as calculated from the following:   Height as of this encounter: 5' 4" (1.626 m).   Weight as of this encounter: 104 kg.  Procedure:  Procedure(s) (LRB): LEFT TOTAL KNEE ARTHROPLASTY (Left)   Consults: None  HPI: Deborah Elliott is a 72 y.o. year old female with end stage OA of her left knee with progressively worsening pain and dysfunction. She has constant pain, with activity and at rest and significant functional deficits with difficulties even with ADLs. She has had extensive non-op management including analgesics, injections of cortisone and viscosupplements, and home exercise program, but remains in significant pain with significant dysfunction. Radiographs show bone on bone arthritis medial and patellofemoral. She presents now for left Total Knee Arthroplasty.   Laboratory Data: Admission on 08/16/2018  Component Date Value Ref Range Status  . Glucose-Capillary 08/16/2018 120* 70 - 99 mg/dL Final  . Comment 1 08/16/2018 Notify RN   Final  . Comment 2 08/16/2018 Document in Chart   Final  . Sodium 08/16/2018 141  135 - 145 mmol/L  Final  . Potassium 08/16/2018 4.1  3.5 - 5.1 mmol/L Final  . Glucose, Bld 08/16/2018 114* 70 - 99 mg/dL Final  . HCT 08/16/2018 39.0  36.0 - 46.0 % Final  . Hemoglobin 08/16/2018 13.3  12.0 - 15.0 g/dL Final  . Glucose-Capillary 08/16/2018 113* 70 - 99 mg/dL Final  . Comment 1 08/16/2018 Notify RN   Final  . WBC 08/17/2018 8.3  4.0 - 10.5 K/uL Final  . RBC 08/17/2018 3.57* 3.87 - 5.11 MIL/uL Final  . Hemoglobin 08/17/2018 10.6* 12.0 - 15.0 g/dL Final  . HCT 08/17/2018 33.3* 36.0 - 46.0 % Final  . MCV 08/17/2018 93.3  80.0 - 100.0 fL Final  . MCH 08/17/2018 29.7  26.0 - 34.0 pg Final  . MCHC 08/17/2018 31.8  30.0 - 36.0 g/dL Final  . RDW 08/17/2018 13.2  11.5 - 15.5 % Final  . Platelets 08/17/2018 133* 150 - 400 K/uL Final  . nRBC 08/17/2018 0.0  0.0 - 0.2 % Final   Performed at [facility], [place] [address], [place], [place] [address]  . Sodium 08/17/2018 139  135 - 145 mmol/L Final  . Potassium 08/17/2018 4.2  3.5 - 5.1 mmol/L Final  . Chloride 08/17/2018 108  98 - 111 mmol/L Final  . CO2 08/17/2018 25  22 - 32 mmol/L Final  . Glucose, Bld 08/17/2018 244* 70 - 99 mg/dL Final  . BUN 08/17/2018 11  8 - 23 mg/dL Final  . Creatinine, Ser 08/17/2018 0.51  0.44 - 1.00 mg/dL Final  . Calcium 08/17/2018 8.3* 8.9 - 10.3 mg/dL Final  . GFR calc non Af Amer 08/17/2018 >60  >60 mL/min Final  . GFR calc Af Amer 08/17/2018 >60  >60 mL/min Final   Comment: (NOTE) The eGFR has been calculated using the CKD EPI equation. This calculation has not been validated in all clinical situations. eGFR's persistently <60 mL/min signify possible Chronic Kidney Disease.   Georgiann Hahn gap 08/17/2018 6  5 - 15 Final   Performed at [facility], [place] [address], [place], [place] [address]  . WBC 08/18/2018 9.2  4.0 - 10.5 K/uL Final  . RBC 08/18/2018 3.64* 3.87 - 5.11 MIL/uL Final  . Hemoglobin 08/18/2018 10.6* 12.0 - 15.0 g/dL Final  . HCT 08/18/2018 34.0* 36.0 - 46.0 % Final   . MCV 08/18/2018 93.4  80.0 - 100.0 fL Final  . MCH 08/18/2018 29.1  26.0 - 34.0 pg Final  . MCHC 08/18/2018 31.2  30.0 - 36.0 g/dL Final  . RDW 08/18/2018 13.5  11.5 - 15.5 % Final  . Platelets 08/18/2018 145* 150 - 400 K/uL Final  . nRBC 08/18/2018 0.0  0.0 - 0.2 % Final   Performed at [facility], [place] [address], [place], [place] [address]  . Sodium 08/18/2018 139  135 - 145 mmol/L Final  . Potassium 08/18/2018 3.5  3.5 - 5.1 mmol/L Final  . Chloride 08/18/2018 107  98 - 111 mmol/L Final  . CO2 08/18/2018 27  22 - 32 mmol/L Final  . Glucose, Bld 08/18/2018 145* 70 - 99 mg/dL Final  . BUN 08/18/2018 12  8 - 23 mg/dL Final  . Creatinine, Ser 08/18/2018 0.59  0.44 - 1.00 mg/dL Final  . Calcium 08/18/2018 8.4* 8.9 - 10.3 mg/dL Final  . GFR calc non Af Amer 08/18/2018 >60  >60 mL/min Final  . GFR calc Af Amer 08/18/2018 >60  >60 mL/min Final   Comment: (NOTE) The eGFR has been calculated using the CKD EPI equation. This calculation has not been validated in all clinical situations. eGFR's persistently <60 mL/min signify possible Chronic Kidney Disease.   Georgiann Hahn gap 08/18/2018 5  5 - 15 Final   Performed at [facility], [place] [address] [place], [place] [address]  . WBC 08/19/2018 7.8  4.0 - 10.5 K/uL Final  . RBC 08/19/2018 3.40* 3.87 - 5.11 MIL/uL Final  . Hemoglobin 08/19/2018 10.2* 12.0 - 15.0 g/dL Final  . HCT 08/19/2018 31.5* 36.0 - 46.0 % Final  . MCV 08/19/2018 92.6  80.0 - 100.0 fL Final  . MCH 08/19/2018 30.0  26.0 - 34.0 pg Final  . MCHC 08/19/2018 32.4  30.0 - 36.0 g/dL Final  . RDW 08/19/2018 13.5  11.5 - 15.5 % Final  . Platelets 08/19/2018 130* 150 - 400 K/uL Final  . nRBC 08/19/2018 0.0  0.0 - 0.2 % Final   Performed at [facility], [place] [address], [place], [place] [address]  Hospital Outpatient Visit on 08/09/2018  Component Date Value Ref Range Status  . aPTT 08/09/2018 34  24 - 36 seconds Final    Performed at [facility], [place] [address], [place], [place] [address]  . WBC 08/09/2018 6.0  4.0 - 10.5 K/uL Final  . RBC 08/09/2018 4.75  3.87 - 5.11 MIL/uL Final  . Hemoglobin 08/09/2018 14.1  12.0 - 15.0 g/dL  Final  . HCT 08/09/2018 43.7  36.0 - 46.0 % Final  . MCV 08/09/2018 92.0  80.0 - 100.0 fL Final  . MCH 08/09/2018 29.7  26.0 - 34.0 pg Final  . MCHC 08/09/2018 32.3  30.0 - 36.0 g/dL Final  . RDW 08/09/2018 13.2  11.5 - 15.5 % Final  . Platelets 08/09/2018 172  150 - 400 K/uL Final  . nRBC 08/09/2018 0.0  0.0 - 0.2 % Final   Performed at [facility], [place] [address] [place], [place] [address]  . Sodium 08/09/2018 143  135 - 145 mmol/L Final  . Potassium 08/09/2018 5.3* 3.5 - 5.1 mmol/L Final  . Chloride 08/09/2018 110  98 - 111 mmol/L Final  . CO2 08/09/2018 26  22 - 32 mmol/L Final  . Glucose, Bld 08/09/2018 116* 70 - 99 mg/dL Final  . BUN 08/09/2018 11  8 - 23 mg/dL Final  . Creatinine, Ser 08/09/2018 0.63  0.44 - 1.00 mg/dL Final  . Calcium 08/09/2018 9.5  8.9 - 10.3 mg/dL Final  . Total Protein 08/09/2018 6.9  6.5 - 8.1 g/dL Final  . Albumin 08/09/2018 4.0  3.5 - 5.0 g/dL Final  . AST 08/09/2018 32  15 - 41 U/L Final  . ALT 08/09/2018 17  0 - 44 U/L Final  . Alkaline Phosphatase 08/09/2018 49  38 - 126 U/L Final  . Total Bilirubin 08/09/2018 0.8  0.3 - 1.2 mg/dL Final  . GFR calc non Af Amer 08/09/2018 >60  >60 mL/min Final  . GFR calc Af Amer 08/09/2018 >60  >60 mL/min Final   Comment: (NOTE) The eGFR has been calculated using the CKD EPI equation. This calculation has not been validated in all clinical situations. eGFR's persistently <60 mL/min signify possible Chronic Kidney Disease.   Georgiann Hahn gap 08/09/2018 7  5 - 15 Final   Performed at [facility], [place] [address], [place], [place] [address]  . Prothrombin Time 08/09/2018 13.0  11.4 - 15.2 seconds Final  . INR 08/09/2018 0.99   Final   Performed at  [facility], [place] [address] [place], [place] [address]  . ABO/RH(D) 08/09/2018 O NEG   Final  . Antibody Screen 08/09/2018 NEG   Final  . Sample Expiration 08/09/2018 08/19/2018   Final  . Extend sample reason 08/09/2018    Final                   Value:NO TRANSFUSIONS OR PREGNANCY IN THE PAST 3 MONTHS Performed at [facility], [place] [address], [place], [place] [address]   . MRSA, PCR 08/09/2018 NEGATIVE  NEGATIVE Final  . Staphylococcus aureus 08/09/2018 NEGATIVE  NEGATIVE Final   Comment: (NOTE) The Xpert SA Assay (FDA approved for NASAL specimens in patients 19 years of age and older), is one component of a comprehensive surveillance program. It is not intended to diagnose infection nor to guide or monitor treatment. Performed at [facility], [place] [address] [place], [place] [address]   . Hgb A1c MFr Bld 08/09/2018 6.2* 4.8 - 5.6 % Final   Comment: (NOTE)         Prediabetes: 5.7 - 6.4         Diabetes: >6.4         Glycemic control for adults with diabetes: <7.0   . Mean Plasma Glucose 08/09/2018 131  mg/dL Final   Comment: (NOTE) Performed At: [facility] [address]  [place], [place] [id number] Rush Farmer MD LH:[id number]   . Glucose-Capillary 08/09/2018 109* 70 - 99 mg/dL Final     X-Rays:No results found.  EKG: Orders placed or performed in visit on 07/23/18  . EKG 12-Lead     Hospital Course: Deborah Elliott is a 72 y.o. who was admitted to [facility]. They were brought to the operating room on 08/16/2018 and underwent Procedure(s): LEFT TOTAL KNEE ARTHROPLASTY.  Patient tolerated the procedure well and was later transferred to the recovery room and then to the orthopaedic floor for postoperative care. They were given PO and IV analgesics for pain control following their surgery. They were given 24 hours of postoperative antibiotics of  Anti-infectives (From admission, onward)    Start     Dose/Rate Route Frequency Ordered Stop   08/16/18 1900  ceFAZolin (ANCEF) IVPB 2g/100 mL premix     2 g 200 mL/hr over 30 Minutes Intravenous Every 6 hours 08/16/18 1549 08/17/18 0148   08/16/18 1015  ceFAZolin (ANCEF) IVPB 2g/100 mL premix     2 g 200 mL/hr over 30 Minutes Intravenous On call to O.R. 08/16/18 1014 08/16/18 1301     and started on DVT prophylaxis in the form of Aspirin and Plavix.   PT and OT were ordered for total joint protocol. Discharge planning consulted to help with postop disposition and equipment needs. Patient had a decent night on the evening of surgery. They started to get up OOB with therapy on POD #1. Hemovac drain was pulled without difficulty on day one. Continued to work with therapy into POD #2. Dressing was changed on day two and the incision was clean, dry, and intact with minimal serous drainage. Pt was slower to progress with therapy. Incision was healing well on day 3. It was originally discussed that patient would discharge home on POD #3 with HHPT. However, patient was not meeting goals with therapy and was requiring 2+ assist for all transfers. It was felt that patient would benefit from SNF placement for 24/7 assistance and supervision with mobility. Patient continued to work with therapy on POD #4. Patient was seen on day 4 and was ready for discharge to SNF pending bed availability. She was discharged later that day in stable condition.  Diet: Cardiac diet Activity: WBAT Follow-up: in 2 weeks with Dr. Wynelle Link. Disposition: Skilled nursing facility Discharged Condition: stable   Discharge Instructions    Call MD / Call 911   Complete by:  As directed    If you experience chest pain or shortness of breath, CALL 911 and be transported to the hospital emergency room.  If you develope a fever above 101 F, pus (white drainage) or increased drainage or redness at the wound, or calf pain, call your surgeon's office.   Change dressing   Complete by:   As directed    Change the dressing daily with sterile 4 x 4 inch gauze dressing and apply TED hose.   Constipation Prevention   Complete by:  As directed    Drink plenty of fluids.  Prune juice may be helpful.  You may use a stool softener, such as Colace (over the counter) 100 mg twice a day.  Use MiraLax (over the counter) for constipation as needed.   Diet - low sodium heart healthy   Complete by:  As directed    Discharge instructions   Complete by:  As directed    Dr. Gaynelle Arabian Total Joint Specialist [facility] [address]  [place],  [address] [phone]  TOTAL KNEE REPLACEMENT POSTOPERATIVE DIRECTIONS  Knee Rehabilitation, Guidelines Following Surgery  Results after knee surgery are often greatly improved when you follow the exercise, range of motion and muscle strengthening exercises prescribed by your doctor. Safety measures are also important to protect the knee from further injury. Any time any of these exercises cause you to have increased pain or swelling in your knee joint, decrease the amount until you are comfortable again and slowly increase them. If you have problems or questions, call your caregiver or physical therapist for advice.   HOME CARE INSTRUCTIONS  Remove items at home which could result in a fall. This includes throw rugs or furniture in walking pathways.  ICE to the affected knee every three hours for 30 minutes at a time and then as needed for pain and swelling.  Continue to use ice on the knee for pain and swelling from surgery. You may notice swelling that will progress down to the foot and ankle.  This is normal after surgery.  Elevate the leg when you are not up walking on it.   Continue to use the breathing machine which will help keep your temperature down.  It is common for your temperature to cycle up and down following surgery, especially at night when you are not up moving around and exerting yourself.  The breathing  machine keeps your lungs expanded and your temperature down. Do not place pillow under knee, focus on keeping the knee straight while resting   DIET You may resume your previous home diet once your are discharged from the hospital.  DRESSING / WOUND CARE / SHOWERING You may shower 3 days after surgery, but keep the wounds dry during showering.  You may use an occlusive plastic wrap (Press'n Seal for example), NO SOAKING/SUBMERGING IN THE BATHTUB.  If the bandage gets wet, change with a clean dry gauze.  If the incision gets wet, pat the wound dry with a clean towel. You may start showering once you are discharged home but do not submerge the incision under water. Just pat the incision dry and apply a dry gauze dressing on daily. Change the surgical dressing daily and reapply a dry dressing each time.  ACTIVITY Walk with your walker as instructed. Use walker as long as suggested by your caregivers. Avoid periods of inactivity such as sitting longer than an hour when not asleep. This helps prevent blood clots.  You may resume a sexual relationship in one month or when given the OK by your doctor.  You may return to work once you are cleared by your doctor.  Do not drive a car for 6 weeks or until released by you surgeon.  Do not drive while taking narcotics.  WEIGHT BEARING Weight bearing as tolerated with assist device (walker, cane, etc) as directed, use it as long as suggested by your surgeon or therapist, typically at least 4-6 weeks.  POSTOPERATIVE CONSTIPATION PROTOCOL Constipation - defined medically as fewer than three stools per week and severe constipation as less than one stool per week.  One of the most common issues patients have following surgery is constipation.  Even if you have a regular bowel pattern at home, your normal regimen is likely to be disrupted due to multiple reasons following surgery.  Combination of anesthesia, postoperative narcotics, change in appetite and  fluid intake all can affect your bowels.  In order to avoid complications following surgery, here are some recommendations in order  to help you during your recovery period.  Colace (docusate) - Pick up an over-the-counter form of Colace or another stool softener and take twice a day as long as you are requiring postoperative pain medications.  Take with a full glass of water daily.  If you experience loose stools or diarrhea, hold the colace until you stool forms back up.  If your symptoms do not get better within 1 week or if they get worse, check with your doctor.  Dulcolax (bisacodyl) - Pick up over-the-counter and take as directed by the product packaging as needed to assist with the movement of your bowels.  Take with a full glass of water.  Use this product as needed if not relieved by Colace only.   MiraLax (polyethylene glycol) - Pick up over-the-counter to have on hand.  MiraLax is a solution that will increase the amount of water in your bowels to assist with bowel movements.  Take as directed and can mix with a glass of water, juice, soda, coffee, or tea.  Take if you go more than two days without a movement. Do not use MiraLax more than once per day. Call your doctor if you are still constipated or irregular after using this medication for 7 days in a row.  If you continue to have problems with postoperative constipation, please contact the office for further assistance and recommendations.  If you experience "the worst abdominal pain ever" or develop nausea or vomiting, please contact the office immediatly for further recommendations for treatment.  ITCHING  If you experience itching with your medications, try taking only a single pain pill, or even half a pain pill at a time.  You can also use Benadryl over the counter for itching or also to help with sleep.   TED HOSE STOCKINGS Wear the elastic stockings on both legs for three weeks following surgery during the day but you may remove  then at night for sleeping.  MEDICATIONS See your medication summary on the "After Visit Summary" that the nursing staff will review with you prior to discharge.  You may have some home medications which will be placed on hold until you complete the course of blood thinner medication.  It is important for you to complete the blood thinner medication as prescribed by your surgeon.  Continue your approved medications as instructed at time of discharge.  PRECAUTIONS If you experience chest pain or shortness of breath - call 911 immediately for transfer to the hospital emergency department.  If you develop a fever greater that 101 F, purulent drainage from wound, increased redness or drainage from wound, foul odor from the wound/dressing, or calf pain - CONTACT YOUR SURGEON.                                                   FOLLOW-UP APPOINTMENTS Make sure you keep all of your appointments after your operation with your surgeon and caregivers. You should call the office at the above phone number and make an appointment for approximately two weeks after the date of your surgery or on the date instructed by your surgeon outlined in the "After Visit Summary".   RANGE OF MOTION AND STRENGTHENING EXERCISES  Rehabilitation of the knee is important following a knee injury or an operation. After just a few days of immobilization, the muscles of the thigh  which control the knee become weakened and shrink (atrophy). Knee exercises are designed to build up the tone and strength of the thigh muscles and to improve knee motion. Often times heat used for twenty to thirty minutes before working out will loosen up your tissues and help with improving the range of motion but do not use heat for the first two weeks following surgery. These exercises can be done on a training (exercise) mat, on the floor, on a table or on a bed. Use what ever works the best and is most comfortable for you Knee exercises include:  Leg Lifts  - While your knee is still immobilized in a splint or cast, you can do straight leg raises. Lift the leg to 60 degrees, hold for 3 sec, and slowly lower the leg. Repeat 10-20 times 2-3 times daily. Perform this exercise against resistance later as your knee gets better.  Quad and Hamstring Sets - Tighten up the muscle on the front of the thigh (Quad) and hold for 5-10 sec. Repeat this 10-20 times hourly. Hamstring sets are done by pushing the foot backward against an object and holding for 5-10 sec. Repeat as with quad sets.  Leg Slides: Lying on your back, slowly slide your foot toward your buttocks, bending your knee up off the floor (only go as far as is comfortable). Then slowly slide your foot back down until your leg is flat on the floor again. Angel Wings: Lying on your back spread your legs to the side as far apart as you can without causing discomfort.  A rehabilitation program following serious knee injuries can speed recovery and prevent re-injury in the future due to weakened muscles. Contact your doctor or a physical therapist for more information on knee rehabilitation.   IF YOU ARE TRANSFERRED TO A SKILLED REHAB FACILITY If the patient is transferred to a skilled rehab facility following release from the hospital, a list of the current medications will be sent to the facility for the patient to continue.  When discharged from the skilled rehab facility, please have the facility set up the patient's [place] prior to being released. Also, the skilled facility will be responsible for providing the patient with their medications at time of release from the facility to include their pain medication, the muscle relaxants, and their blood thinner medication. If the patient is still at the rehab facility at time of the two week follow up appointment, the skilled rehab facility will also need to assist the patient in arranging follow up appointment in our office and any  transportation needs.  MAKE SURE YOU:  Understand these instructions.  Get help right away if you are not doing well or get worse.    Pick up stool softner and laxative for home use following surgery while on pain medications. Do not submerge incision under water. Please use good hand washing techniques while changing dressing each day. May shower starting three days after surgery. Please use a clean towel to pat the incision dry following showers. Continue to use ice for pain and swelling after surgery. Do not use any lotions or creams on the incision until instructed by your surgeon.   Do not put a pillow under the knee. Place it under the heel.   Complete by:  As directed    Driving restrictions   Complete by:  As directed    No driving for two weeks   TED hose   Complete by:  As directed  Use stockings (TED hose) for three weeks on both leg(s).  You may remove them at night for sleeping.   Weight bearing as tolerated   Complete by:  As directed      Allergies as of 08/20/2018      Reactions   Morphine And Related Other (See Comments)   Reaction unknown per patient   Latex Rash   Blood blisters   Sulfur Itching, Rash   Tape Rash      Medication List    TAKE these medications   acetaminophen 500 MG tablet Commonly known as:  TYLENOL Take 500 mg by mouth daily as needed (pain).   aspirin 325 MG EC tablet Take 1 tablet (325 mg total) by mouth daily with breakfast. Take with plavix for three weeks following surgery, then discontinue aspirin.   bismuth subsalicylate 203 TD/97CB suspension Commonly known as:  PEPTO BISMOL Take 10 mLs by mouth every 6 (six) hours as needed (upset stomach).   clopidogrel 75 MG tablet Commonly known as:  PLAVIX TAKE 1 TABLET DAILY   docusate sodium 100 MG capsule Commonly known as:  COLACE Take 1 capsule (100 mg total) by mouth 2 (two) times daily.   esomeprazole 20 MG capsule Commonly known as:  NEXIUM Take 20 mg by mouth daily  as needed (indigestion).   fosinopril 20 MG tablet Commonly known as:  MONOPRIL Take 20 mg by mouth daily.   furosemide 40 MG tablet Commonly known as:  LASIX Take 20 mg by mouth daily.   meclizine 25 MG tablet Commonly known as:  ANTIVERT Take 25 mg by mouth 2 (two) times daily as needed for dizziness.   metFORMIN 500 MG tablet Commonly known as:  GLUCOPHAGE Take 500 mg by mouth 2 (two) times daily.   methocarbamol 500 MG tablet Commonly known as:  ROBAXIN Take 1 tablet (500 mg total) by mouth every 6 (six) hours as needed for muscle spasms.   metoprolol succinate 50 MG 24 hr tablet Commonly known as:  TOPROL-XL Take 50 mg by mouth daily. Take with or immediately following a meal.   ondansetron 4 MG tablet Commonly known as:  ZOFRAN Take 1 tablet (4 mg total) by mouth every 6 (six) hours as needed for nausea.   oxyCODONE 5 MG immediate release tablet Commonly known as:  Oxy IR/ROXICODONE Take 1-2 tablets (5-10 mg total) by mouth every 6 (six) hours as needed for severe pain (pain score 4-6).   polyethylene glycol packet Commonly known as:  MIRALAX / GLYCOLAX Take 17 g by mouth daily as needed for mild constipation.   potassium chloride SA 20 MEQ tablet Commonly known as:  K-DUR,KLOR-CON Take 20 mEq by mouth daily.   psyllium 95 % Pack Commonly known as:  HYDROCIL/METAMUCIL Take 1 packet by mouth daily as needed (regularity).   rosuvastatin 20 MG tablet Commonly known as:  CRESTOR Take 20 mg by mouth every evening.   traMADol 50 MG tablet Commonly known as:  ULTRAM Take 1-2 tablets (50-100 mg total) by mouth every 6 (six) hours as needed for moderate pain (refractory to oxycodone).            Durable Medical Equipment  (From admission, onward)         Start     Ordered   08/17/18 1151  For home use only DME 3 n 1  Once     08/17/18 1150   08/17/18 1150  For home use only DME Walker rolling  Once  Question:  Patient needs a walker to treat with the  following condition  Answer:  History of orthopedic surgery   08/17/18 1150           Discharge Care Instructions  (From admission, onward)         Start     Ordered   08/17/18 0000  Weight bearing as tolerated     08/17/18 0755   08/17/18 0000  Change dressing    Comments:  Change the dressing daily with sterile 4 x 4 inch gauze dressing and apply TED hose.   08/17/18 0755          Contact information for follow-up providers    Gaynelle Arabian, MD. Schedule an appointment as soon as possible for a visit on 08/31/2018.   Specialty:  Orthopedic Surgery Contact information: [address] [place] [address] [phone]            Contact information for after-discharge care    [facility] .   Service:  Skilled Nursing Contact information: [address] [place] [phone]                  Signed: Theresa Duty, PA-C Orthopedic Surgery 08/20/2018, 10:56 AM

## 2018-08-20 NOTE — Clinical Social Work Placement (Signed)
   CLINICAL SOCIAL WORK PLACEMENT  NOTE  Date:  08/20/2018  Patient Details  Name: Deborah Elliott MRN: [id number] Date of Birth: 05-05-1946  Clinical Social Work is seeking post-discharge placement for this patient at the [place] level of care (*CSW will initial, date and re-position this form in  chart as items are completed):  Yes   Patient/family provided with [facility] Work Department's list of facilities offering this level of care within the geographic area requested by the patient (or if unable, by the patient's family).  Yes   Patient/family informed of their freedom to choose among providers that offer the needed level of care, that participate in Medicare, Medicaid or managed care program needed by the patient, have an available bed and are willing to accept the patient.  Yes   Patient/family informed of [facility]'s ownership interest in [facility] and [facility], as well as of the fact that they are under no obligation to receive care at these facilities.  Yes   Patient/family informed of bed offers received.  Patient chooses bed at    [facility]  Physician recommends and patient chooses bed at     [facility] [address]  Patient to be transferred to   on  . 08/20/18  Patient to be transferred to facility by     [facility]  Patient family notified on   of transfer. yes  Name of family member notified:      Efrat Zuidema  PHYSICIAN Please prepare prescriptions     Additional Comment:    _______________________________________________ Joellen Jersey, [place] 08/20/2018, 1:57 PM

## 2018-08-20 NOTE — Progress Notes (Signed)
Occupational Therapy Treatment Patient Details Name: Deborah Elliott MRN: [id number] DOB: 08-Dec-1945 Today's Date: 08/20/2018    History of present illness L TKA, R TKA 5 years ago   OT comments  Pt performed HEP with supervision and min cueing. Written program provided. Encouraged her to do 2x day on her own  Follow Up Recommendations  SNF;Follow surgeon's recommendation for DC plan and follow-up therapies    Equipment Recommendations  None recommended by OT    Recommendations for Other Services      Precautions / Restrictions Precautions Precautions: Knee;Fall Precaution Comments: significant incontinence of urine. Required Braces or Orthoses: Knee Immobilizer - Left Restrictions Weight Bearing Restrictions: No       Mobility Bed Mobility                  Transfers                      Balance                                           ADL either performed or assessed with clinical judgement   ADL                                               Vision       Perception     Praxis      Cognition Arousal/Alertness: Awake/alert Behavior During Therapy: WFL for tasks assessed/performed Overall Cognitive Status: Within Functional Limits for tasks assessed                                          Exercises Other Exercises Other Exercises: pt performed written HEP with supervision, min cues. Much improved from yesterday   Shoulder Instructions       General Comments      Pertinent Vitals/ Pain       Pain Score: 2  Pain Location: L knee Pain Descriptors / Indicators: Sore;Aching Pain Intervention(s): Limited activity within patient's tolerance;Monitored during session;Repositioned;Premedicated before session  Home Living                                          Prior Functioning/Environment              Frequency  Min 2X/week        Progress Toward  Goals  OT Goals(current goals can now be found in the care plan section)  Progress towards OT goals: Progressing toward goals     Plan      Co-evaluation                 AM-PAC PT "6 Clicks" Daily Activity     Outcome Measure   Help from another person eating meals?: None Help from another person taking care of personal grooming?: A Little Help from another person toileting, which includes using toliet, bedpan, or urinal?: A Lot Help from another person bathing (including washing, rinsing, drying)?: A Lot Help from another person to put on and taking off regular upper  body clothing?: A Little Help from another person to put on and taking off regular lower body clothing?: Total 6 Click Score: 15    End of Session  Left in bed with call bell      Activity Tolerance   Tolerated well  Patient Left     Nurse Communication          Time: 1962-2297 OT Time Calculation (min): 21 min  Charges: OT General Charges $OT Visit: 1 Visit OT Treatments $Therapeutic Exercise: 8-22 mins  Lesle Chris, OTR/L Acute Rehabilitation Services [phone] [place] pager [phone] office 08/20/2018   Deborah Elliott 08/20/2018, 9:25 AM

## 2018-08-20 NOTE — Progress Notes (Signed)
Insurance authorization approved. Patient may discharge to [facility] today. Discharge summary faxed.   RN Call for report: [phone]- Speak with 500 hall nurse, please  [facility] CALLED   Spouse, Sharda Keddy, notified by phone.   Stephanie Acre, [place] Social Worker [phone]

## 2018-08-20 NOTE — Progress Notes (Signed)
Physical Therapy Treatment Patient Details Name: Deborah Elliott MRN: [id number] DOB: 16-Jun-1946 Today's Date: 08/20/2018    History of Present Illness L TKA, R TKA 5 years ago    PT Comments    POD # 4 Assisted OOB to [facility] then amb required increased time and + 2 assist.   Pt plans to D/C to SNF for ST Rehab.    Follow Up Recommendations  SNF     Equipment Recommendations       Recommendations for Other Services       Precautions / Restrictions Precautions Precautions: Knee;Fall Precaution Comments: significant incontinence of urine. Required Braces or Orthoses: Knee Immobilizer - Left Knee Immobilizer - Left: Discontinue once straight leg raise with < 10 degree lag Restrictions Weight Bearing Restrictions: No    Mobility  Bed Mobility Overal bed mobility: Needs Assistance Bed Mobility: Supine to Sit     Supine to sit: Max assist     General bed mobility comments: Max assist to support L LE and Max Assist to scoot hips to EOB.  Allowed increased time sitting EOB to decrease anxiety/fear of pain   Transfers Overall transfer level: Needs assistance Equipment used: Rolling walker (2 wheeled) Transfers: Sit to/from [facility] Sit to Stand: Mod assist;+2 physical assistance;+2 safety/equipment Stand pivot transfers: Mod assist;+2 physical assistance;+2 safety/equipment       General transfer comment: 75% VC's on proper hand placement, turn completion and safety as we assistded from elevated bed to [facility].    Ambulation/Gait Ambulation/Gait assistance: Mod assist;+2 physical assistance;+2 safety/equipment Gait Distance (Feet): 8 Feet Assistive device: Rolling walker (2 wheeled) Gait Pattern/deviations: Step-to pattern;Decreased step length - left;Decreased stance time - left;Trunk flexed;Antalgic Gait velocity: decr   General Gait Details: limited distance due to fatigue and increased anxiety/fear of more pain and falling "is that chair bedhind  me?"    Stairs             Wheelchair Mobility    Modified Rankin (Stroke Patients Only)       Balance                                            Cognition Arousal/Alertness: Awake/alert Behavior During Therapy: WFL for tasks assessed/performed Overall Cognitive Status: Within Functional Limits for tasks assessed                                 General Comments: some anxiety/fear of pain with tendency to scream then appologize       Exercises      General Comments        Pertinent Vitals/Pain Pain Assessment: Faces Faces Pain Scale: Hurts even more Pain Location: L knee   pt has a tendancy to scream with mvt Pain Descriptors / Indicators: Sore;Aching Pain Intervention(s): Monitored during session;Repositioned;Ice applied    Home Living                      Prior Function            PT Goals (current goals can now be found in the care plan section) Progress towards PT goals: Progressing toward goals    Frequency    7X/week      PT Plan Current plan remains appropriate    Co-evaluation  AM-PAC PT "6 Clicks" Daily Activity  Outcome Measure  Difficulty turning over in bed (including adjusting bedclothes, sheets and blankets)?: A Lot Difficulty moving from lying on back to sitting on the side of the bed? : A Lot Difficulty sitting down on and standing up from a chair with arms (e.g., wheelchair, bedside commode, etc,.)?: A Lot Help needed moving to and from a bed to chair (including a wheelchair)?: A Lot Help needed walking in hospital room?: A Lot Help needed climbing 3-5 steps with a railing? : A Lot 6 Click Score: 12    End of Session Equipment Utilized During Treatment: Left knee immobilizer Activity Tolerance: Patient limited by pain;Patient limited by fatigue Patient left: in chair;with call bell/phone within reach Nurse Communication: Mobility status PT Visit Diagnosis:  Unsteadiness on feet (R26.81) Pain - Right/Left: Left Pain - part of body: Knee     Time: 1206-1232 PT Time Calculation (min) (ACUTE ONLY): 26 min  Charges:  $Gait Training: 8-22 mins $Therapeutic Activity: 8-22 mins                     Rica Koyanagi  PTA Acute  Rehabilitation Services Pager      [phone] Office      [phone]

## 2018-08-20 NOTE — Progress Notes (Signed)
CSW reached out to [facility] regarding patient. Patient's insurance authorization is still pending but expected to finalize later today.   CSW following for discharge needs.  Stephanie Acre, [place] Social Worker [phone]

## 2018-08-20 NOTE — Progress Notes (Signed)
Report called to Joseph Art, LPN at [facility] in [place].  Pt awaiting transport via [facility].

## 2018-09-01 ENCOUNTER — Encounter: Payer: Self-pay | Admitting: Physical Therapy

## 2018-09-01 ENCOUNTER — Ambulatory Visit: Payer: Medicare Other | Attending: Orthopedic Surgery | Admitting: Physical Therapy

## 2018-09-01 ENCOUNTER — Other Ambulatory Visit: Payer: Self-pay

## 2018-09-01 DIAGNOSIS — R262 Difficulty in walking, not elsewhere classified: Secondary | ICD-10-CM

## 2018-09-01 DIAGNOSIS — M25562 Pain in left knee: Secondary | ICD-10-CM

## 2018-09-01 DIAGNOSIS — M6281 Muscle weakness (generalized): Secondary | ICD-10-CM

## 2018-09-01 DIAGNOSIS — M25662 Stiffness of left knee, not elsewhere classified: Secondary | ICD-10-CM

## 2018-09-01 NOTE — Therapy (Addendum)
[facility] [place], [place], [address] Phone: [phone]   Fax:  [phone]  Physical Therapy Evaluation  Patient Details  Name: Deborah Elliott MRN: [id number] Date of Birth: 1946-01-23 Referring Provider (PT): Gaynelle Arabian, MD   Encounter Date: 09/01/2018  PT End of Session - 09/01/18 1245    Visit Number  1    Number of Visits  12    Date for PT Re-Evaluation  11/24/18    PT Start Time  1115    PT Stop Time  1209    PT Time Calculation (min)  54 min    Activity Tolerance  Patient limited by pain;Patient tolerated treatment well    Behavior During Therapy  [facility] for tasks assessed/performed;Anxious       Past Medical History:  Diagnosis Date  . Achilles rupture, left 04/06/2012  . Arthritis of knee   . Cancer ([place])    skin cancer  . Coronary artery disease    X3 STENTS  . Diabetes mellitus without complication ([place])   . Dyspnea   . Dysrhythmia    HX of tachycardia - controlled with med  . GERD (gastroesophageal reflux disease)   . Hx of skin cancer, basal cell   . Hyperlipidemia   . Hypertension   . Leg cramps   . Obesity   . PONV (postoperative nausea and vomiting)   . Sleep apnea    uses c-pap   . Vertigo     Past Surgical History:  Procedure Laterality Date  . APPENDECTOMY  48 YRS AGO  . COLONOSCOPY    . NM MYOCAR PERF WALL MOTION  05/27/2010   normal  . SKIN CANCER EXCISION    . STENTED CORONARY ARTERY  03/14/2010   X3 STENTS; tandem proximal and mid LAD  . TOTAL KNEE ARTHROPLASTY Right 08/29/2013   Procedure: RIGHT TOTAL KNEE ARTHROPLASTY;  Surgeon: Gearlean Alf, MD;  Location: [facility];  Service: Orthopedics;  Laterality: Right;  . TOTAL KNEE ARTHROPLASTY Left 08/16/2018   Procedure: LEFT TOTAL KNEE ARTHROPLASTY;  Surgeon: Gaynelle Arabian, MD;  Location: [facility];  Service: Orthopedics;  Laterality: Left;  58min    There were no vitals filed for this visit.   Subjective Assessment -  09/01/18 2114    Subjective  Patient arrive to physical therapy with her husband with reports of left knee pain and difficulty walking secondary to a left total knee replacement on 08/16/18. Patient stated she was at [facility] for about a week and received therapy there. She was discharged on Monday, 08/30/18 and has returned home with her husband. She requires assistance from her family to perform ADLs and to bathe. Patient reports pain at worst is 9/10 and pain at best is 1/10 with pain medication. Patient's goals are to decrease pain, improve movement, improve strength and improve ability to perform ADLs.     Pertinent History  HTN, R TKA, CAD, DM    Limitations  Standing;Walking;House hold activities;Sitting    Patient Stated Goals  decrease knee pain, move better    Currently in Pain?  Yes    Pain Score  2     Pain Location  Knee    Pain Orientation  Left    Pain Descriptors / Indicators  Sore;Discomfort    Pain Type  Surgical pain    Pain Onset  1 to 4 weeks ago    Pain Frequency  Constant    Aggravating Factors   moving knee  Pain Relieving Factors  resting, pain medication    Effect of Pain on Daily Activities  unable to perform ADLs without assistance at this time         [facility] PT Assessment - 09/01/18 0001      Assessment   Medical Diagnosis  Unilateral primary osteoarthritis, left knee, s/p left total knee arthroplasty    Referring Provider (PT)  Gaynelle Arabian, MD    Onset Date/Surgical Date  08/16/18    Prior Therapy  yes      Precautions   Precautions  None      Restrictions   Weight Bearing Restrictions  No      Balance Screen   Has the patient fallen in the past 6 months  No    Has the patient had a decrease in activity level because of a fear of falling?   No    Is the patient reluctant to leave their home because of a fear of falling?   No      Home Environment   Living Environment  Private residence    Living Arrangements   Spouse/significant other    Available Help at Discharge  Family    Type of [place]      Prior Function   Level of Independence  Needs assistance with ADLs;Independent with gait      Observation/Other Assessments   Skin Integrity  incision is dry with moderate scabbing, moderate ecchymosis throughout anterior knee    Focus on Therapeutic Outcomes (FOTO)   70% limited      Observation/Other Assessments-Edema    Edema  Circumferential      Circumferential Edema   Circumferential - Right  42 cm at mid patella    Circumferential - Left   46 cm at mid patella      ROM / Strength   AROM / PROM / Strength  AROM;PROM      AROM   AROM Assessment Site  Knee    Right/Left Knee  Left    Left Knee Extension  8    Left Knee Flexion  70      PROM   PROM Assessment Site  Knee    Right/Left Knee  Left    Left Knee Extension  6    Left Knee Flexion  82      Palpation   Patella mobility  2/6 inferior/superior left patella mobility      Transfers   Comments  requires Min A at L LE to raise leg on/off table      Ambulation/Gait   Assistive device  Rolling walker    Gait Pattern  Step-through pattern;Decreased step length - right;Decreased stance time - left;Decreased hip/knee flexion - left;Decreased weight shift to left;Left flexed knee in stance       Vasopneumatic device to left knee low pressure x15 mins         Objective measurements completed on examination: See above findings.              PT Education - 09/01/18 2146    Education Details  LAQ, seated heel slides, supine heel slides, quad sets    Person(s) Educated  Patient    Methods  Explanation;Demonstration;Handout    Comprehension  Verbalized understanding;Returned demonstration       PT Short Term Goals - 09/01/18 2155      PT SHORT TERM GOAL #1   Title  Patient will be independent with initial HEP    Time  2    Period  Weeks    Status  New      PT SHORT TERM GOAL #2   Title  Patient will  demonstrate 3 degrees or less of left knee extension AROM to improve gait mechanics    Time  2    Period  Weeks    Status  New      PT SHORT TERM GOAL #3   Title  Patient will demonstrate 90+ degress of left knee flexion AROM to improve ability to perfrom functional tasks.    Time  2    Period  Weeks    Status  New        PT Long Term Goals - 09/01/18 2156      PT LONG TERM GOAL #1   Title  Patient will be independent with advanced HEP    Time  12    Period  Weeks    Status  New      PT LONG TERM GOAL #2   Title  Patient will demonstrate 115+ degrees of left knee flexion AROM to improve ability to perform functional tasks.    Time  12    Period  Weeks    Status  New      PT LONG TERM GOAL #3   Title  Patient will demonstate 0 degress of left knee extension AROM to improve gait mechanics.    Time  12    Period  Weeks    Status  New      PT LONG TERM GOAL #4   Title  Patient will demonstrate 4+/5 or greater left knee MMT in all planes to improve stability during functional tasks.    Time  12    Period  Weeks    Status  New      PT LONG TERM GOAL #5   Title  Patient will ambulate community distances with least restrictive AD with left knee pain less than 3/10.    Time  12    Period  Weeks    Status  New      Additional Long Term Goals   Additional Long Term Goals  Yes      PT LONG TERM GOAL #6   Title  Patient will report ability to perfrom all ADLs independently with pain less than 3/10 in left knee.    Time  12    Period  Weeks    Status  New             Plan - 09/01/18 2147    Clinical Impression Statement  Patient is a 72 year old female who presents to physical therapy with left knee pain, decreased left ROM, and increased localized edema secondary to a left total knee replacement on 08/16/18. Patient ambulates with a rolling walker with decreased L stance time, decreased right step length, and decreased knee flexion during swing phase. Patient noted  with improved heel strike/toe off after cuing. Patient required min A to raise leg on/off the bed for supine <-> sit transitions. Due to a high copay, patient will attend physical therapy for 2x per week for 1 week then transition to 1x per week for remaining visit with heavy emphasis on HEP. PT provided and reviewed HEP with patient and patient's husband to which both reported agreement and understanding. Patient would benefit from skilled physical therapy to address deficits and address patient's goals.     Clinical Presentation  Stable    Clinical Decision  Making  Low    Rehab Potential  Good    Clinical Impairments Affecting Rehab Potential  high copay.    PT Frequency  2x / week   2x per week, then 1x for 10 weeks   PT Duration  Other (comment)   13 weeks   PT Treatment/Interventions  ADLs/Self Care Home Management;Gait training;Stair training;Therapeutic activities;Therapeutic exercise;Balance training;Functional mobility training;Cryotherapy;Vasopneumatic Device;Patient/family education;Manual techniques;Passive range of motion;Neuromuscular re-education    PT Next Visit Plan  nustep, left knee AROM, PROM, modalities for pain relief    PT Home Exercise Plan  see patient education section    Consulted and Agree with Plan of Care  Patient       Patient will benefit from skilled therapeutic intervention in order to improve the following deficits and impairments:  Pain, Decreased activity tolerance, Decreased endurance, Decreased range of motion, Decreased strength, Difficulty walking, Increased edema  Visit Diagnosis: Acute pain of left knee - Plan: PT plan of care cert/re-cert  Stiffness of left knee, not elsewhere classified - Plan: PT plan of care cert/re-cert  Muscle weakness (generalized) - Plan: PT plan of care cert/re-cert  Difficulty in walking, not elsewhere classified - Plan: PT plan of care cert/re-cert     Problem List Patient Active Problem List   Diagnosis Date Noted   . Postoperative anemia due to acute blood loss 08/30/2013  . OA (osteoarthritis) of knee 08/29/2013  . Coronary artery disease 03/24/2013  . Obesity (BMI 35.0-39.9 without comorbidity) 03/24/2013  . Essential hypertension 03/24/2013  . Dyslipidemia 03/24/2013   Gabriela Eves, PT, DPT 09/01/2018, 10:12 PM  [facility] [address] [place], [place], [address] Phone: [phone]   Fax:  [phone]  Name: DEZHANE STATEN MRN: [id number] Date of Birth: 1946/01/01

## 2018-09-03 ENCOUNTER — Encounter: Payer: Self-pay | Admitting: Physical Therapy

## 2018-09-03 ENCOUNTER — Ambulatory Visit: Payer: Medicare Other | Admitting: Physical Therapy

## 2018-09-03 DIAGNOSIS — M6281 Muscle weakness (generalized): Secondary | ICD-10-CM

## 2018-09-03 DIAGNOSIS — M25562 Pain in left knee: Secondary | ICD-10-CM

## 2018-09-03 DIAGNOSIS — R262 Difficulty in walking, not elsewhere classified: Secondary | ICD-10-CM

## 2018-09-03 DIAGNOSIS — M25662 Stiffness of left knee, not elsewhere classified: Secondary | ICD-10-CM

## 2018-09-03 NOTE — Therapy (Signed)
[facility] [place], [place], [address] Phone: [phone]   Fax:  [phone]  Physical Therapy Treatment  Patient Details  Name: Deborah Elliott MRN: [id number] Date of Birth: Apr 30, 1946 Referring Provider (PT): Gaynelle Arabian, MD   Encounter Date: 09/03/2018  PT End of Session - 09/03/18 1206    Visit Number  2    Number of Visits  12    Date for PT Re-Evaluation  11/05/18    PT Start Time  1115    PT Stop Time  1216    PT Time Calculation (min)  61 min    Activity Tolerance  Patient limited by pain;Patient tolerated treatment well    Behavior During Therapy  [facility] for tasks assessed/performed;Anxious       Past Medical History:  Diagnosis Date  . Achilles rupture, left 04/06/2012  . Arthritis of knee   . Cancer ([place])    skin cancer  . Coronary artery disease    X3 STENTS  . Diabetes mellitus without complication ([place])   . Dyspnea   . Dysrhythmia    HX of tachycardia - controlled with med  . GERD (gastroesophageal reflux disease)   . Hx of skin cancer, basal cell   . Hyperlipidemia   . Hypertension   . Leg cramps   . Obesity   . PONV (postoperative nausea and vomiting)   . Sleep apnea    uses c-pap   . Vertigo     Past Surgical History:  Procedure Laterality Date  . APPENDECTOMY  48 YRS AGO  . COLONOSCOPY    . NM MYOCAR PERF WALL MOTION  05/27/2010   normal  . SKIN CANCER EXCISION    . STENTED CORONARY ARTERY  03/14/2010   X3 STENTS; tandem proximal and mid LAD  . TOTAL KNEE ARTHROPLASTY Right 08/29/2013   Procedure: RIGHT TOTAL KNEE ARTHROPLASTY;  Surgeon: Gearlean Alf, MD;  Location: [facility];  Service: Orthopedics;  Laterality: Right;  . TOTAL KNEE ARTHROPLASTY Left 08/16/2018   Procedure: LEFT TOTAL KNEE ARTHROPLASTY;  Surgeon: Gaynelle Arabian, MD;  Location: [facility];  Service: Orthopedics;  Laterality: Left;  52min    There were no vitals filed for this visit.  Subjective Assessment - 09/03/18  1205    Subjective  Patient reports feeling sore and complaint with HEP.     Pertinent History  HTN, R TKA, CAD, DM    Limitations  Standing;Walking;House hold activities;Sitting    Patient Stated Goals  decrease knee pain, move better    Currently in Pain?  Yes    Pain Score  4     Pain Location  Knee    Pain Orientation  Left    Pain Descriptors / Indicators  Tightness;Sore;Discomfort    Pain Type  Surgical pain    Pain Onset  1 to 4 weeks ago    Pain Frequency  Constant         [facility] PT Assessment - 09/03/18 0001      Assessment   Medical Diagnosis  Unilateral primary osteoarthritis, left knee, s/p left total knee arthroplasty    Referring Provider (PT)  Gaynelle Arabian, MD    Onset Date/Surgical Date  08/16/18    Next MD Visit  09/21/18    Prior Therapy  yes                   [facility] Adult PT Treatment/Exercise - 09/03/18 0001      Exercises  Exercises  Knee/Hip      Knee/Hip Exercises: Aerobic   Nustep  Level 1 x10 mins increasing seat from 11 to 10      Knee/Hip Exercises: Seated   Long Arc Quad  AROM;Left;20 reps   3 second hold   Hamstring Curl  AROM;Left;2 sets;10 reps      Modalities   Modalities  Vasopneumatic;Electrical Aeronautical engineer Location  left knee    Electrical Stimulation Action  IFC    Electrical Stimulation Parameters  80-150 hz x 15 mins    Electrical Stimulation Goals  Edema;Pain      Vasopneumatic   Number Minutes Vasopneumatic   15 minutes    Vasopnuematic Location   Knee    Vasopneumatic Pressure  Low    Vasopneumatic Temperature   34      Manual Therapy   Manual Therapy  Joint mobilization;Passive ROM    Joint Mobilization  patella mobilizations in all planes to improve ROM    Passive ROM  in sitting PROM into extension and flexion with tactile cue at hip to prevent hip hiking. contract-relax 3" hold x5 to improve flexion. intermittent oscillations to promote muscle  relaxation.               PT Short Term Goals - 09/01/18 2155      PT SHORT TERM GOAL #1   Title  Patient will be independent with initial HEP    Time  2    Period  Weeks    Status  New      PT SHORT TERM GOAL #2   Title  Patient will demonstrate 3 degrees or less of left knee extension AROM to improve gait mechanics    Time  2    Period  Weeks    Status  New      PT SHORT TERM GOAL #3   Title  Patient will demonstrate 90+ degress of left knee flexion AROM to improve ability to perfrom functional tasks.    Time  2    Period  Weeks    Status  New        PT Long Term Goals - 09/01/18 2156      PT LONG TERM GOAL #1   Title  Patient will be independent with advanced HEP    Time  12    Period  Weeks    Status  New      PT LONG TERM GOAL #2   Title  Patient will demonstrate 115+ degrees of left knee flexion AROM to improve ability to perform functional tasks.    Time  12    Period  Weeks    Status  New      PT LONG TERM GOAL #3   Title  Patient will demonstate 0 degress of left knee extension AROM to improve gait mechanics.    Time  12    Period  Weeks    Status  New      PT LONG TERM GOAL #4   Title  Patient will demonstrate 4+/5 or greater left knee MMT in all planes to improve stability during functional tasks.    Time  12    Period  Weeks    Status  New      PT LONG TERM GOAL #5   Title  Patient will ambulate community distances with least restrictive AD with left knee pain less than 3/10.  Time  12    Period  Weeks    Status  New      Additional Long Term Goals   Additional Long Term Goals  Yes      PT LONG TERM GOAL #6   Title  Patient will report ability to perfrom all ADLs independently with pain less than 3/10 in left knee.    Time  12    Period  Weeks    Status  New            Plan - 09/03/18 1232    Clinical Impression Statement  Patient was able to tolerate treatment well with therex. Patient was able to demonstrate good form  with all exercises after demonstration. PROM performed in sitting with tactile cues at left hip to prevent hip hiking. Patient noted with increased pain towards end range flexion and extension. Patient responded well to contract relax PNF to promote knee flexion. Patient expressed how she is hypersensitive along knee and the incision. Normal response to modalities at end of session. Patient required min A for supine <-> sit transitions to raise leg on/off table. Patient and patient's husband discussed changing plan of care and requested to attend therapy for 2x per week for another two weeks (3 weeks total at 2x/week) then transition to 1x per week for remaining 6 visits. Re-certification sent to MD.    Clinical Presentation  Stable    Clinical Decision Making  Low    Rehab Potential  Good    Clinical Impairments Affecting Rehab Potential  high copay.    PT Frequency  2x / week    PT Duration  Other (comment)   2x for 3 weeks; 1x for 6 weeks   PT Treatment/Interventions  ADLs/Self Care Home Management;Gait training;Stair training;Therapeutic activities;Therapeutic exercise;Balance training;Functional mobility training;Cryotherapy;Vasopneumatic Device;Patient/family education;Manual techniques;Passive range of motion;Neuromuscular re-education    PT Next Visit Plan  nustep, left knee AROM, PROM, modalities for pain relief    Consulted and Agree with Plan of Care  Patient       Patient will benefit from skilled therapeutic intervention in order to improve the following deficits and impairments:  Pain, Decreased activity tolerance, Decreased endurance, Decreased range of motion, Decreased strength, Difficulty walking, Increased edema  Visit Diagnosis: Acute pain of left knee - Plan: PT plan of care cert/re-cert  Stiffness of left knee, not elsewhere classified - Plan: PT plan of care cert/re-cert  Muscle weakness (generalized) - Plan: PT plan of care cert/re-cert  Difficulty in walking, not  elsewhere classified - Plan: PT plan of care cert/re-cert     Problem List Patient Active Problem List   Diagnosis Date Noted  . Postoperative anemia due to acute blood loss 08/30/2013  . OA (osteoarthritis) of knee 08/29/2013  . Coronary artery disease 03/24/2013  . Obesity (BMI 35.0-39.9 without comorbidity) 03/24/2013  . Essential hypertension 03/24/2013  . Dyslipidemia 03/24/2013   Gabriela Eves, PT, DPT  09/03/2018, 12:49 PM  [facility] [address] [place], [place], [address] Phone: [phone]   Fax:  [phone]  Name: Deborah Elliott MRN: [id number] Date of Birth: [birth year]

## 2018-09-06 ENCOUNTER — Encounter: Payer: Medicare Other | Admitting: Physical Therapy

## 2018-09-08 ENCOUNTER — Encounter: Payer: Self-pay | Admitting: Physical Therapy

## 2018-09-08 ENCOUNTER — Ambulatory Visit: Payer: Medicare Other | Admitting: Physical Therapy

## 2018-09-08 DIAGNOSIS — M6281 Muscle weakness (generalized): Secondary | ICD-10-CM

## 2018-09-08 DIAGNOSIS — M25562 Pain in left knee: Secondary | ICD-10-CM | POA: Diagnosis not present

## 2018-09-08 DIAGNOSIS — R262 Difficulty in walking, not elsewhere classified: Secondary | ICD-10-CM

## 2018-09-08 DIAGNOSIS — M25662 Stiffness of left knee, not elsewhere classified: Secondary | ICD-10-CM

## 2018-09-08 NOTE — Therapy (Addendum)
[facility] [place], [place], [address] Phone: [phone]   Fax:  [phone]  Physical Therapy Treatment  Patient Details  Name: Deborah Elliott MRN: [id number] Date of Birth: May 01, 1946 Referring Provider (PT): Gaynelle Arabian, MD   Encounter Date: 09/08/2018  PT End of Session - 09/08/18 1117    Visit Number  3    Number of Visits  12    Date for PT Re-Evaluation  11/05/18    PT Start Time  1115    PT Stop Time  1218    PT Time Calculation (min)  63 min    Activity Tolerance  Patient limited by pain;Patient tolerated treatment well    Behavior During Therapy  [facility] for tasks assessed/performed       Past Medical History:  Diagnosis Date  . Achilles rupture, left 04/06/2012  . Arthritis of knee   . Cancer ([place])    skin cancer  . Coronary artery disease    X3 STENTS  . Diabetes mellitus without complication ([place])   . Dyspnea   . Dysrhythmia    HX of tachycardia - controlled with med  . GERD (gastroesophageal reflux disease)   . Hx of skin cancer, basal cell   . Hyperlipidemia   . Hypertension   . Leg cramps   . Obesity   . PONV (postoperative nausea and vomiting)   . Sleep apnea    uses c-pap   . Vertigo     Past Surgical History:  Procedure Laterality Date  . APPENDECTOMY  48 YRS AGO  . COLONOSCOPY    . NM MYOCAR PERF WALL MOTION  05/27/2010   normal  . SKIN CANCER EXCISION    . STENTED CORONARY ARTERY  03/14/2010   X3 STENTS; tandem proximal and mid LAD  . TOTAL KNEE ARTHROPLASTY Right 08/29/2013   Procedure: RIGHT TOTAL KNEE ARTHROPLASTY;  Surgeon: Gearlean Alf, MD;  Location: [facility];  Service: Orthopedics;  Laterality: Right;  . TOTAL KNEE ARTHROPLASTY Left 08/16/2018   Procedure: LEFT TOTAL KNEE ARTHROPLASTY;  Surgeon: Gaynelle Arabian, MD;  Location: [facility];  Service: Orthopedics;  Laterality: Left;  20min    There were no vitals filed for this visit.  Subjective Assessment - 09/08/18 1117     Subjective  Patient reports feeling sore, 3/10    Pertinent History  HTN, R TKA, CAD, DM    Limitations  Standing;Walking;House hold activities;Sitting    Patient Stated Goals  decrease knee pain, move better    Currently in Pain?  Yes    Pain Score  3     Pain Location  Knee    Pain Orientation  Left    Pain Descriptors / Indicators  Tightness;Sore    Pain Type  Surgical pain    Pain Onset  1 to 4 weeks ago    Pain Frequency  Constant         [facility] PT Assessment - 09/08/18 0001      Assessment   Medical Diagnosis  Unilateral primary osteoarthritis, left knee, s/p left total knee arthroplasty    Referring Provider (PT)  Gaynelle Arabian, MD    Onset Date/Surgical Date  08/16/18    Next MD Visit  09/21/18    Prior Therapy  yes      Precautions   Precautions  None      Restrictions   Weight Bearing Restrictions  No      AROM   AROM Assessment Site  Knee  Right/Left Knee  Left    Left Knee Extension  4    Left Knee Flexion  93      PROM   PROM Assessment Site  Knee    Right/Left Knee  Left    Left Knee Extension  4    Left Knee Flexion  98                   [facility] Adult PT Treatment/Exercise - 09/08/18 0001      Exercises   Exercises  Knee/Hip      Knee/Hip Exercises: Stretches   Passive Hamstring Stretch  Left;3 reps   15 seconds sitting     Knee/Hip Exercises: Aerobic   Nustep  Level 4 x15 mins increasing seat from 9 to 7      Knee/Hip Exercises: Standing   Forward Lunges  Left;2 sets;10 reps;3 seconds      Modalities   Modalities  Vasopneumatic;Electrical Stimulation      Electrical Stimulation   Electrical Stimulation Location  left knee    Electrical Stimulation Action  IFC    Electrical Stimulation Parameters  80-150 hz x15 min    Electrical Stimulation Goals  Edema;Pain      Vasopneumatic   Number Minutes Vasopneumatic   15 minutes    Vasopnuematic Location   Knee    Vasopneumatic Pressure  Low    Vasopneumatic Temperature   50       Manual Therapy   Passive ROM  in sitting PROM into extension and flexion with tactile cue at hip to prevent hip hiking. contract-relax 3" hold to improve flexion. intermittent oscillations to promote muscle relaxation. followed by knee extension PROM in supine               PT Short Term Goals - 09/01/18 2155      PT SHORT TERM GOAL #1   Title  Patient will be independent with initial HEP    Time  2    Period  Weeks    Status  New      PT SHORT TERM GOAL #2   Title  Patient will demonstrate 3 degrees or less of left knee extension AROM to improve gait mechanics    Time  2    Period  Weeks    Status  New      PT SHORT TERM GOAL #3   Title  Patient will demonstrate 90+ degress of left knee flexion AROM to improve ability to perfrom functional tasks.    Time  2    Period  Weeks    Status  New        PT Long Term Goals - 09/01/18 2156      PT LONG TERM GOAL #1   Title  Patient will be independent with advanced HEP    Time  12    Period  Weeks    Status  New      PT LONG TERM GOAL #2   Title  Patient will demonstrate 115+ degrees of left knee flexion AROM to improve ability to perform functional tasks.    Time  12    Period  Weeks    Status  New      PT LONG TERM GOAL #3   Title  Patient will demonstate 0 degress of left knee extension AROM to improve gait mechanics.    Time  12    Period  Weeks    Status  New  PT LONG TERM GOAL #4   Title  Patient will demonstrate 4+/5 or greater left knee MMT in all planes to improve stability during functional tasks.    Time  12    Period  Weeks    Status  New      PT LONG TERM GOAL #5   Title  Patient will ambulate community distances with least restrictive AD with left knee pain less than 3/10.    Time  12    Period  Weeks    Status  New      Additional Long Term Goals   Additional Long Term Goals  Yes      PT LONG TERM GOAL #6   Title  Patient will report ability to perfrom all ADLs independently with  pain less than 3/10 in left knee.    Time  12    Period  Weeks    Status  New            Plan - 09/08/18 1206    Clinical Impression Statement  Patient was able to tolerate treatment fairly well. Patient was able to perform exercises with good form after explanation. Patient did really well with contract relax PRN technique to improve ROM. PROM 4-98 degrees. Patient provided with seated hamstring stretch; patient reported understanding. Normal response to modalities upon removal. Patient demonstrated improved bed mobility and did not require assistance with left LE from PT.     Clinical Presentation  Stable    Clinical Decision Making  Low    Rehab Potential  Good    Clinical Impairments Affecting Rehab Potential  high copay.    PT Frequency  2x / week    PT Duration  Other (comment)    PT Treatment/Interventions  ADLs/Self Care Home Management;Gait training;Stair training;Therapeutic activities;Therapeutic exercise;Balance training;Functional mobility training;Cryotherapy;Vasopneumatic Device;Patient/family education;Manual techniques;Passive range of motion;Neuromuscular re-education    PT Next Visit Plan  nustep, left knee AROM, PROM, modalities for pain relief    Consulted and Agree with Plan of Care  Patient       Patient will benefit from skilled therapeutic intervention in order to improve the following deficits and impairments:  Pain, Decreased activity tolerance, Decreased endurance, Decreased range of motion, Decreased strength, Difficulty walking, Increased edema  Visit Diagnosis: Acute pain of left knee  Stiffness of left knee, not elsewhere classified  Muscle weakness (generalized)  Difficulty in walking, not elsewhere classified     Problem List Patient Active Problem List   Diagnosis Date Noted  . Postoperative anemia due to acute blood loss 08/30/2013  . OA (osteoarthritis) of knee 08/29/2013  . Coronary artery disease 03/24/2013  . Obesity (BMI 35.0-39.9  without comorbidity) 03/24/2013  . Essential hypertension 03/24/2013  . Dyslipidemia 03/24/2013    Gabriela Eves, PT, DPT 09/08/2018, 12:58 PM  [facility] [address] [place], [place], [address] Phone: [phone]   Fax:  [phone]  Name: TANYA CROTHERS MRN: [id number] Date of Birth: April 02, 1946

## 2018-09-14 ENCOUNTER — Ambulatory Visit: Payer: Medicare Other | Admitting: Physical Therapy

## 2018-09-14 DIAGNOSIS — M25662 Stiffness of left knee, not elsewhere classified: Secondary | ICD-10-CM

## 2018-09-14 DIAGNOSIS — M6281 Muscle weakness (generalized): Secondary | ICD-10-CM

## 2018-09-14 DIAGNOSIS — M25562 Pain in left knee: Secondary | ICD-10-CM | POA: Diagnosis not present

## 2018-09-14 DIAGNOSIS — R262 Difficulty in walking, not elsewhere classified: Secondary | ICD-10-CM

## 2018-09-14 NOTE — Therapy (Signed)
[facility] [place], [place], [address] Phone: [phone]   Fax:  [phone]  Physical Therapy Treatment  Patient Details  Name: Deborah Elliott MRN: [id number] Date of Birth: Jun 08, 1946 Referring Provider (PT): Gaynelle Arabian, MD   Encounter Date: 09/14/2018  PT End of Session - 09/14/18 1128    Visit Number  4    Number of Visits  12    Date for PT Re-Evaluation  11/05/18    Authorization Type  Progress note every 10th visit    PT Start Time  1115    Activity Tolerance  Patient limited by pain;Patient tolerated treatment well    Behavior During Therapy  [facility] for tasks assessed/performed       Past Medical History:  Diagnosis Date  . Achilles rupture, left 04/06/2012  . Arthritis of knee   . Cancer ([place])    skin cancer  . Coronary artery disease    X3 STENTS  . Diabetes mellitus without complication ([place])   . Dyspnea   . Dysrhythmia    HX of tachycardia - controlled with med  . GERD (gastroesophageal reflux disease)   . Hx of skin cancer, basal cell   . Hyperlipidemia   . Hypertension   . Leg cramps   . Obesity   . PONV (postoperative nausea and vomiting)   . Sleep apnea    uses c-pap   . Vertigo     Past Surgical History:  Procedure Laterality Date  . APPENDECTOMY  48 YRS AGO  . COLONOSCOPY    . NM MYOCAR PERF WALL MOTION  05/27/2010   normal  . SKIN CANCER EXCISION    . STENTED CORONARY ARTERY  03/14/2010   X3 STENTS; tandem proximal and mid LAD  . TOTAL KNEE ARTHROPLASTY Right 08/29/2013   Procedure: RIGHT TOTAL KNEE ARTHROPLASTY;  Surgeon: Gearlean Alf, MD;  Location: [facility];  Service: Orthopedics;  Laterality: Right;  . TOTAL KNEE ARTHROPLASTY Left 08/16/2018   Procedure: LEFT TOTAL KNEE ARTHROPLASTY;  Surgeon: Gaynelle Arabian, MD;  Location: [facility];  Service: Orthopedics;  Laterality: Left;  34min    There were no vitals filed for this visit.  Subjective Assessment - 09/14/18 1210    Subjective  Patient reports feeling sore 4/10 still with some cramping and pain in posterolateral knee    Pertinent History  HTN, R TKA, CAD, DM    Limitations  Standing;Walking;House hold activities;Sitting    Patient Stated Goals  decrease knee pain, move better    Currently in Pain?  Yes    Pain Score  4     Pain Orientation  Left    Pain Descriptors / Indicators  Tightness;Sore    Pain Type  Surgical pain    Pain Onset  More than a month ago    Pain Frequency  Constant         [facility] PT Assessment - 09/14/18 0001      Assessment   Medical Diagnosis  Unilateral primary osteoarthritis, left knee, s/p left total knee arthroplasty    Referring Provider (PT)  Gaynelle Arabian, MD    Onset Date/Surgical Date  08/16/18    Next MD Visit  09/21/18    Prior Therapy  yes      Precautions   Precautions  None      Restrictions   Weight Bearing Restrictions  No      AROM   AROM Assessment Site  Knee    Right/Left Knee  Left    Left Knee Extension  6      PROM   PROM Assessment Site  Knee    Right/Left Knee  Left    Left Knee Flexion  108                   [facility] Adult PT Treatment/Exercise - 09/14/18 0001      Exercises   Exercises  Knee/Hip      Knee/Hip Exercises: Aerobic   Nustep  Level 4 x10 mins increasing seat from 8 to 7      Knee/Hip Exercises: Standing   Hip Flexion  AROM;Left;2 sets;10 reps      Modalities   Modalities  Vasopneumatic;Electrical Stimulation      Electrical Stimulation   Electrical Stimulation Location  left knee    Electrical Stimulation Action  IFC    Electrical Stimulation Parameters  80-150 hz x15      Vasopneumatic   Number Minutes Vasopneumatic   15 minutes    Vasopnuematic Location   Knee    Vasopneumatic Pressure  Low    Vasopneumatic Temperature   50      Manual Therapy   Manual Therapy  Joint mobilization;Passive ROM;Soft tissue mobilization    Joint Mobilization  patella mobilizations in all planes to improve ROM     Soft tissue mobilization  STW/M to posterolateral knee to decrease pain and tone    Passive ROM  in sitting PROM into extension and flexion with tactile cue at hip to prevent hip hiking. contract-relax 3" hold to improve flexion. intermittent oscillations to promote muscle relaxation. followed by knee extension PROM in supine               PT Short Term Goals - 09/14/18 1252      PT SHORT TERM GOAL #1   Title  Patient will be independent with initial HEP    Time  2    Period  Weeks    Status  Achieved      PT SHORT TERM GOAL #2   Title  Patient will demonstrate 3 degrees or less of left knee extension AROM to improve gait mechanics    Time  2    Period  Weeks    Status  On-going      PT SHORT TERM GOAL #3   Title  Patient will demonstrate 90+ degress of left knee flexion AROM to improve ability to perfrom functional tasks.    Time  2    Period  Weeks    Status  Achieved        PT Long Term Goals - 09/14/18 1243      PT LONG TERM GOAL #1   Title  Patient will be independent with advanced HEP    Time  12    Period  Weeks    Status  On-going      PT LONG TERM GOAL #2   Title  Patient will demonstrate 115+ degrees of left knee flexion AROM to improve ability to perform functional tasks.    Time  12    Period  Weeks    Status  On-going      PT LONG TERM GOAL #3   Title  Patient will demonstate 0 degress of left knee extension AROM to improve gait mechanics.    Time  12    Period  Weeks    Status  On-going      PT LONG TERM GOAL #4  Title  Patient will demonstrate 4+/5 or greater left knee MMT in all planes to improve stability during functional tasks.    Time  12    Period  Weeks    Status  On-going      PT LONG TERM GOAL #5   Title  Patient will ambulate community distances with least restrictive AD with left knee pain less than 3/10.    Time  12    Period  Weeks    Status  On-going      PT LONG TERM GOAL #6   Title  Patient will report ability to  perfrom all ADLs independently with pain less than 3/10 in left knee.    Time  12    Period  Weeks    Status  On-going            Plan - 09/14/18 1244    Clinical Impression Statement  Patient was able to tolerate treatment well despite cramping in right posterolateral knee throughout the session. STW/M provided to posterolateral knee to decrease pain and decrease tone and spasm. Patient noted with improved AROM 6-102. Patient continues to amb with walker and still feels unsteady while walking. Patient instructed to continue amb with walker and emphasize on quad strengthening and will attempt gait training with cane next visit. Normal response to modalities at end of session.     Clinical Presentation  Stable    Clinical Decision Making  Low    Rehab Potential  Good    Clinical Impairments Affecting Rehab Potential  high copay.    PT Frequency  2x / week    PT Duration  Other (comment)    PT Treatment/Interventions  ADLs/Self Care Home Management;Gait training;Stair training;Therapeutic activities;Therapeutic exercise;Balance training;Functional mobility training;Cryotherapy;Vasopneumatic Device;Patient/family education;Manual techniques;Passive range of motion;Neuromuscular re-education    PT Next Visit Plan  Quad strengthening, gait training, nustep, left knee AROM, PROM, modalities for pain relief    Consulted and Agree with Plan of Care  Patient       Patient will benefit from skilled therapeutic intervention in order to improve the following deficits and impairments:  Pain, Decreased activity tolerance, Decreased endurance, Decreased range of motion, Decreased strength, Difficulty walking, Increased edema  Visit Diagnosis: Acute pain of left knee  Stiffness of left knee, not elsewhere classified  Muscle weakness (generalized)  Difficulty in walking, not elsewhere classified     Problem List Patient Active Problem List   Diagnosis Date Noted  . Postoperative anemia due  to acute blood loss 08/30/2013  . OA (osteoarthritis) of knee 08/29/2013  . Coronary artery disease 03/24/2013  . Obesity (BMI 35.0-39.9 without comorbidity) 03/24/2013  . Essential hypertension 03/24/2013  . Dyslipidemia 03/24/2013   Gabriela Eves, PT, DPT 09/14/2018, 12:52 PM  [facility] [address] [place], [place], [address] Phone: [phone]   Fax:  [phone]  Name: Deborah Elliott MRN: [id number] Date of Birth: [birth year]

## 2018-09-21 ENCOUNTER — Encounter: Payer: Medicare Other | Admitting: Physical Therapy

## 2018-09-22 ENCOUNTER — Ambulatory Visit: Payer: Medicare Other | Attending: Orthopedic Surgery | Admitting: Physical Therapy

## 2018-09-22 ENCOUNTER — Encounter: Payer: Self-pay | Admitting: Physical Therapy

## 2018-09-22 DIAGNOSIS — M6281 Muscle weakness (generalized): Secondary | ICD-10-CM | POA: Diagnosis present

## 2018-09-22 DIAGNOSIS — R262 Difficulty in walking, not elsewhere classified: Secondary | ICD-10-CM

## 2018-09-22 DIAGNOSIS — M25562 Pain in left knee: Secondary | ICD-10-CM

## 2018-09-22 DIAGNOSIS — M25662 Stiffness of left knee, not elsewhere classified: Secondary | ICD-10-CM | POA: Insufficient documentation

## 2018-09-22 NOTE — Therapy (Signed)
[facility] [place], [place], [address] Phone: [phone]   Fax:  [phone]  Physical Therapy Treatment  Patient Details  Name: Deborah Elliott MRN: [id number] Date of Birth: 09-Apr-1946 Referring Provider (PT): Gaynelle Arabian, MD   Encounter Date: 09/22/2018  PT End of Session - 09/22/18 1434    Visit Number  5    Number of Visits  12    Date for PT Re-Evaluation  11/05/18    Authorization Type  Progress note every 10th visit    PT Start Time  1431    PT Stop Time  1520    PT Time Calculation (min)  49 min    Activity Tolerance  Patient tolerated treatment well    Behavior During Therapy  [facility] for tasks assessed/performed       Past Medical History:  Diagnosis Date  . Achilles rupture, left 04/06/2012  . Arthritis of knee   . Cancer ([place])    skin cancer  . Coronary artery disease    X3 STENTS  . Diabetes mellitus without complication ([place])   . Dyspnea   . Dysrhythmia    HX of tachycardia - controlled with med  . GERD (gastroesophageal reflux disease)   . Hx of skin cancer, basal cell   . Hyperlipidemia   . Hypertension   . Leg cramps   . Obesity   . PONV (postoperative nausea and vomiting)   . Sleep apnea    uses c-pap   . Vertigo     Past Surgical History:  Procedure Laterality Date  . APPENDECTOMY  48 YRS AGO  . COLONOSCOPY    . NM MYOCAR PERF WALL MOTION  05/27/2010   normal  . SKIN CANCER EXCISION    . STENTED CORONARY ARTERY  03/14/2010   X3 STENTS; tandem proximal and mid LAD  . TOTAL KNEE ARTHROPLASTY Right 08/29/2013   Procedure: RIGHT TOTAL KNEE ARTHROPLASTY;  Surgeon: Gearlean Alf, MD;  Location: [facility];  Service: Orthopedics;  Laterality: Right;  . TOTAL KNEE ARTHROPLASTY Left 08/16/2018   Procedure: LEFT TOTAL KNEE ARTHROPLASTY;  Surgeon: Gaynelle Arabian, MD;  Location: [facility];  Service: Orthopedics;  Laterality: Left;  17min    There were no vitals filed for this visit.  Subjective  Assessment - 09/22/18 1432    Subjective  Reports that MD very pleased at her follow up and was able to bend independently to 122 deg.    Pertinent History  HTN, R TKA, CAD, DM    Limitations  Standing;Walking;House hold activities;Sitting    Patient Stated Goals  decrease knee pain, move better    Currently in Pain?  Yes    Pain Score  2     Pain Location  Knee    Pain Orientation  Left;Posterior;Distal    Pain Descriptors / Indicators  Discomfort    Pain Type  Surgical pain    Pain Onset  More than a month ago         [facility] PT Assessment - 09/22/18 0001      Assessment   Medical Diagnosis  Unilateral primary osteoarthritis, left knee, s/p left total knee arthroplasty    Referring Provider (PT)  Gaynelle Arabian, MD    Onset Date/Surgical Date  08/16/18    Next MD Visit  10/2018    Prior Therapy  yes      Precautions   Precautions  None      Restrictions   Weight Bearing Restrictions  No      ROM / Strength   AROM / PROM / Strength  AROM      AROM   Overall AROM   Deficits;Within functional limits for tasks performed    AROM Assessment Site  Knee    Right/Left Knee  Left    Left Knee Extension  6    Left Knee Flexion  122                   [facility] Adult PT Treatment/Exercise - 09/22/18 0001      Ambulation/Gait   Ambulation/Gait  Yes    Ambulation/Gait Assistance  6: Modified independent (Device/Increase time)    Assistive device  Straight cane;None    Gait Pattern  Step-through pattern;Decreased stride length;Narrow base of support    Ambulation Surface  Level;Indoor      Exercises   Exercises  Knee/Hip      Knee/Hip Exercises: Stretches   Passive Hamstring Stretch  Left;3 reps;30 seconds      Knee/Hip Exercises: Aerobic   Nustep  L4, seat 8-9 x10 min      Knee/Hip Exercises: Standing   Forward Lunges  Left;2 sets;10 reps;3 seconds    Hip Abduction  AROM;Left;2 sets;10 reps;Knee straight    Forward Step Up  Left;Hand Hold: 2;Step Height: 6";15  reps    Rocker Board  3 minutes      Knee/Hip Exercises: Seated   Long Arc Quad  Strengthening;Left;20 reps;Weights    Long Arc Quad Weight  4 lbs.      Modalities   Modalities  Psychologist, educational Location  L knee    Electrical Stimulation Action  IFC    Electrical Stimulation Parameters  80-150 hz x15 min    Electrical Stimulation Goals  Pain;Edema      Vasopneumatic   Number Minutes Vasopneumatic   15 minutes    Vasopnuematic Location   Knee    Vasopneumatic Pressure  Low    Vasopneumatic Temperature   50               PT Short Term Goals - 09/14/18 1252      PT SHORT TERM GOAL #1   Title  Patient will be independent with initial HEP    Time  2    Period  Weeks    Status  Achieved      PT SHORT TERM GOAL #2   Title  Patient will demonstrate 3 degrees or less of left knee extension AROM to improve gait mechanics    Time  2    Period  Weeks    Status  On-going      PT SHORT TERM GOAL #3   Title  Patient will demonstrate 90+ degress of left knee flexion AROM to improve ability to perfrom functional tasks.    Time  2    Period  Weeks    Status  Achieved        PT Long Term Goals - 09/22/18 1537      PT LONG TERM GOAL #1   Title  Patient will be independent with advanced HEP    Time  12    Period  Weeks    Status  On-going      PT LONG TERM GOAL #2   Title  Patient will demonstrate 115+ degrees of left knee flexion AROM to improve ability to perform functional tasks.    Time  12    Period  Weeks    Status  Achieved      PT LONG TERM GOAL #3   Title  Patient will demonstate 0 degress of left knee extension AROM to improve gait mechanics.    Time  12    Period  Weeks    Status  On-going      PT LONG TERM GOAL #4   Title  Patient will demonstrate 4+/5 or greater left knee MMT in all planes to improve stability during functional tasks.    Time  12    Period  Weeks    Status   On-going      PT LONG TERM GOAL #5   Title  Patient will ambulate community distances with least restrictive AD with left knee pain less than 3/10.    Time  12    Period  Weeks    Status  On-going      PT LONG TERM GOAL #6   Title  Patient will report ability to perfrom all ADLs independently with pain less than 3/10 in left knee.    Time  12    Period  Weeks    Status  On-going            Plan - 09/22/18 1529    Clinical Impression Statement  Patient presented in clinic with only minimal distal L HS discomfort. Patient initially progressed for ambulation from [place] to no AD. VCs for increased step/stride length provided. Patient reported fear and instability without AD and able to ambulate with more confidence with SPC. Patient's L knee ROM improved greatly since last week and measured as 6-122 deg. Normal modalities response noted following removal of the modalities. Patient encouraged to begin using [facility] and can progress to no AD in the coming weeks.     Rehab Potential  Good    Clinical Impairments Affecting Rehab Potential  high copay.    PT Frequency  2x / week    PT Duration  Other (comment)    PT Treatment/Interventions  ADLs/Self Care Home Management;Gait training;Stair training;Therapeutic activities;Therapeutic exercise;Balance training;Functional mobility training;Cryotherapy;Vasopneumatic Device;Patient/family education;Manual techniques;Passive range of motion;Neuromuscular re-education    PT Next Visit Plan  D/C summary required next visit.    PT Home Exercise Plan  see patient education section    Consulted and Agree with Plan of Care  Patient       Patient will benefit from skilled therapeutic intervention in order to improve the following deficits and impairments:  Pain, Decreased activity tolerance, Decreased endurance, Decreased range of motion, Decreased strength, Difficulty walking, Increased edema  Visit Diagnosis: Acute pain of left knee  Stiffness of left  knee, not elsewhere classified  Muscle weakness (generalized)  Difficulty in walking, not elsewhere classified     Problem List Patient Active Problem List   Diagnosis Date Noted  . Postoperative anemia due to acute blood loss 08/30/2013  . OA (osteoarthritis) of knee 08/29/2013  . Coronary artery disease 03/24/2013  . Obesity (BMI 35.0-39.9 without comorbidity) 03/24/2013  . Essential hypertension 03/24/2013  . Dyslipidemia 03/24/2013    Standley Brooking, PTA 09/22/2018, 3:37 PM  [facility] [address] [place], [place], [address] Phone: [phone]   Fax:  [phone]  Name: ORIAN AMBERG MRN: [id number] Date of Birth: Oct 13, 1946

## 2018-09-24 ENCOUNTER — Encounter: Payer: Self-pay | Admitting: Physical Therapy

## 2018-09-24 ENCOUNTER — Ambulatory Visit: Payer: Medicare Other | Admitting: Physical Therapy

## 2018-09-24 DIAGNOSIS — M25562 Pain in left knee: Secondary | ICD-10-CM | POA: Diagnosis not present

## 2018-09-24 DIAGNOSIS — M25662 Stiffness of left knee, not elsewhere classified: Secondary | ICD-10-CM

## 2018-09-24 DIAGNOSIS — M6281 Muscle weakness (generalized): Secondary | ICD-10-CM

## 2018-09-24 DIAGNOSIS — R262 Difficulty in walking, not elsewhere classified: Secondary | ICD-10-CM

## 2018-09-24 NOTE — Therapy (Signed)
[facility] [place], [place], [address] Phone: [phone]   Fax:  [phone]  Physical Therapy Treatment  Patient Details  Name: Deborah Elliott MRN: [id number] Date of Birth: 05-21-1946 Referring Provider (PT): Gaynelle Arabian, MD   Encounter Date: 09/24/2018  PT End of Session - 09/24/18 1224    Visit Number  6    Number of Visits  12    Date for PT Re-Evaluation  11/05/18    Authorization Type  Progress note every 10th visit    PT Start Time  1115    PT Stop Time  1220    PT Time Calculation (min)  65 min    Activity Tolerance  Patient tolerated treatment well    Behavior During Therapy  [facility] for tasks assessed/performed       Past Medical History:  Diagnosis Date  . Achilles rupture, left 04/06/2012  . Arthritis of knee   . Cancer ([place])    skin cancer  . Coronary artery disease    X3 STENTS  . Diabetes mellitus without complication ([place])   . Dyspnea   . Dysrhythmia    HX of tachycardia - controlled with med  . GERD (gastroesophageal reflux disease)   . Hx of skin cancer, basal cell   . Hyperlipidemia   . Hypertension   . Leg cramps   . Obesity   . PONV (postoperative nausea and vomiting)   . Sleep apnea    uses c-pap   . Vertigo     Past Surgical History:  Procedure Laterality Date  . APPENDECTOMY  48 YRS AGO  . COLONOSCOPY    . NM MYOCAR PERF WALL MOTION  05/27/2010   normal  . SKIN CANCER EXCISION    . STENTED CORONARY ARTERY  03/14/2010   X3 STENTS; tandem proximal and mid LAD  . TOTAL KNEE ARTHROPLASTY Right 08/29/2013   Procedure: RIGHT TOTAL KNEE ARTHROPLASTY;  Surgeon: Gearlean Alf, MD;  Location: [facility];  Service: Orthopedics;  Laterality: Right;  . TOTAL KNEE ARTHROPLASTY Left 08/16/2018   Procedure: LEFT TOTAL KNEE ARTHROPLASTY;  Surgeon: Gaynelle Arabian, MD;  Location: [facility];  Service: Orthopedics;  Laterality: Left;  46mn    There were no vitals filed for this visit.  Subjective  Assessment - 09/24/18 1223    Subjective  Patient reports she has been sore due to walking without her walker but she feels good.    Pertinent History  HTN, R TKA, CAD, DM    Limitations  Standing;Walking;House hold activities;Sitting    Patient Stated Goals  decrease knee pain, move better    Currently in Pain?  Yes    Pain Score  2     Pain Location  Knee    Pain Orientation  Left;Posterior    Pain Descriptors / Indicators  Discomfort    Pain Type  Surgical pain    Pain Onset  More than a month ago    Pain Frequency  Intermittent         [facility] PT Assessment - 09/24/18 0001      Assessment   Medical Diagnosis  Unilateral primary osteoarthritis, left knee, s/p left total knee arthroplasty    Referring Provider (PT)  FGaynelle Arabian MD    Onset Date/Surgical Date  08/16/18    Next MD Visit  10/2018    Prior Therapy  yes      Precautions   Precautions  None      Restrictions  Weight Bearing Restrictions  No      ROM / Strength   AROM / PROM / Strength  Strength      AROM   AROM Assessment Site  Knee    Right/Left Knee  Left    Left Knee Extension  6    Left Knee Flexion  120      Strength   Strength Assessment Site  Knee    Right/Left Knee  Right    Right Knee Flexion  4/5    Right Knee Extension  4/5                   [facility] Adult PT Treatment/Exercise - 09/24/18 0001      Exercises   Exercises  Knee/Hip      Knee/Hip Exercises: Aerobic   Stationary Bike  x5 mins seat 5    Nustep  L5, seat 7-9 x15 min      Knee/Hip Exercises: Standing   Rocker Board  3 minutes    SLS  --      Knee/Hip Exercises: Seated   Long Arc Quad  --    Long Arc Quad [employer]  --    Hamstring Curl  --    Hamstring Limitations  --      Modalities   Modalities  Psychologist, educational Location  L knee    Electrical Stimulation Action  IFC    Electrical Stimulation Parameters  80-150 hz x15 min     Electrical Stimulation Goals  Pain;Edema      Vasopneumatic   Number Minutes Vasopneumatic   15 minutes    Vasopnuematic Location   Knee    Vasopneumatic Pressure  Low    Vasopneumatic Temperature   50      Manual Therapy   Manual Therapy  Joint mobilization;Passive ROM;Soft tissue mobilization    Joint Mobilization  patella mobilizations in all planes to improve ROM    Soft tissue mobilization  STW/M to posterolateral knee to decrease pain and tone    Passive ROM  PROM into extension with intermittent oscillations                PT Short Term Goals - 09/14/18 1252      PT SHORT TERM GOAL #1   Title  Patient will be independent with initial HEP    Time  2    Period  Weeks    Status  Achieved      PT SHORT TERM GOAL #2   Title  Patient will demonstrate 3 degrees or less of left knee extension AROM to improve gait mechanics    Time  2    Period  Weeks    Status  On-going      PT SHORT TERM GOAL #3   Title  Patient will demonstrate 90+ degress of left knee flexion AROM to improve ability to perfrom functional tasks.    Time  2    Period  Weeks    Status  Achieved        PT Long Term Goals - 09/24/18 1225      PT LONG TERM GOAL #1   Title  Patient will be independent with advanced HEP    Time  12    Period  Weeks    Status  Achieved      PT LONG TERM GOAL #2   Title  Patient will demonstrate 115+ degrees of left  knee flexion AROM to improve ability to perform functional tasks.    Time  12    Period  Weeks    Status  Achieved      PT LONG TERM GOAL #3   Title  Patient will demonstate 0 degress of left knee extension AROM to improve gait mechanics.    Time  12    Period  Weeks    Status  Not Met      PT LONG TERM GOAL #4   Title  Patient will demonstrate 4+/5 or greater left knee MMT in all planes to improve stability during functional tasks.    Time  12    Period  Weeks    Status  Partially Met      PT LONG TERM GOAL #5   Title  Patient will  ambulate community distances with least restrictive AD with left knee pain less than 3/10.    Time  12    Period  Weeks    Status  Partially Met      PT LONG TERM GOAL #6   Title  Patient will report ability to perfrom all ADLs independently with pain less than 3/10 in left knee.    Time  12    Period  Weeks    Status  Achieved            Plan - 09/24/18 1229    Clinical Impression Statement  Patient was able to tolerate treatment well despite L distolateral hamstring pain. Patient was able to progress to bike with some reports of tightness. Patient's left knee AROM 6-120. Patient's goals were partially met. Patient will be joining the gym program to maintain gains of physical therapy.     Clinical Presentation  Stable    Clinical Decision Making  Low    Rehab Potential  Good    Clinical Impairments Affecting Rehab Potential  high copay.    PT Frequency  2x / week    PT Duration  Other (comment)    PT Treatment/Interventions  ADLs/Self Care Home Management;Gait training;Stair training;Therapeutic activities;Therapeutic exercise;Balance training;Functional mobility training;Cryotherapy;Vasopneumatic Device;Patient/family education;Manual techniques;Passive range of motion;Neuromuscular re-education    PT Next Visit Plan  D/C    PT Home Exercise Plan  see patient education section    Consulted and Agree with Plan of Care  Patient       Patient will benefit from skilled therapeutic intervention in order to improve the following deficits and impairments:  Pain, Decreased activity tolerance, Decreased endurance, Decreased range of motion, Decreased strength, Difficulty walking, Increased edema  Visit Diagnosis: Acute pain of left knee  Stiffness of left knee, not elsewhere classified  Muscle weakness (generalized)  Difficulty in walking, not elsewhere classified     Problem List Patient Active Problem List   Diagnosis Date Noted  . Postoperative anemia due to acute blood  loss 08/30/2013  . OA (osteoarthritis) of knee 08/29/2013  . Coronary artery disease 03/24/2013  . Obesity (BMI 35.0-39.9 without comorbidity) 03/24/2013  . Essential hypertension 03/24/2013  . Dyslipidemia 03/24/2013   Gabriela Eves, PT, DPT 09/24/2018, 12:32 PM  [facility] [address] [place], [place], [address] Phone: [phone]   Fax:  [phone]  Name: WADE SIGALA MRN: [id number] Date of Birth: [birth year]   PHYSICAL THERAPY DISCHARGE SUMMARY  Visits from Start of Care: 6  Current functional level related to goals / functional outcomes: See above   Remaining deficits: See goals   Education / Equipment:  HEP; joining gym program Plan: Patient agrees to discharge.  Patient goals were partially met. Patient is being discharged due to being pleased with the current functional level.  ?????

## 2018-09-27 ENCOUNTER — Ambulatory Visit
Admission: RE | Admit: 2018-09-27 | Discharge: 2018-09-27 | Disposition: A | Discharge status: Home / Self Care | Payer: Medicare Other | Source: Ambulatory Visit | Attending: Internal Medicine | Admitting: Internal Medicine

## 2018-09-27 DIAGNOSIS — N641 Fat necrosis of breast: Secondary | ICD-10-CM

## 2018-10-25 DIAGNOSIS — Z5189 Encounter for other specified aftercare: Secondary | ICD-10-CM | POA: Insufficient documentation

## 2018-12-02 ENCOUNTER — Other Ambulatory Visit: Payer: Self-pay | Admitting: Cardiovascular Disease

## 2019-08-09 ENCOUNTER — Encounter: Payer: Self-pay | Admitting: Cardiovascular Disease

## 2019-08-09 ENCOUNTER — Ambulatory Visit: Payer: Medicare Other | Admitting: Cardiovascular Disease

## 2019-08-09 ENCOUNTER — Other Ambulatory Visit: Payer: Self-pay

## 2019-08-09 DIAGNOSIS — I2510 Atherosclerotic heart disease of native coronary artery without angina pectoris: Secondary | ICD-10-CM

## 2019-08-09 DIAGNOSIS — I10 Essential (primary) hypertension: Secondary | ICD-10-CM | POA: Diagnosis not present

## 2019-08-09 DIAGNOSIS — E785 Hyperlipidemia, unspecified: Secondary | ICD-10-CM | POA: Diagnosis not present

## 2019-08-09 MED ORDER — ROSUVASTATIN CALCIUM 40 MG PO TABS: 40.0000 mg | ORAL_TABLET | Freq: Every evening | ORAL | 3 refills | Status: DC

## 2019-08-09 MED ORDER — COENZYME Q10 200 MG PO CAPS: 200.0000 mg | ORAL_CAPSULE | Freq: Every day | ORAL | 3 refills | Status: DC

## 2019-08-09 NOTE — Assessment & Plan Note (Signed)
History of hyperlipidemia on Crestor 20 mg a day with lipid profile performed 04/25/2019 revealing total cholesterol 45, LDL 85 and HDL of 38 not at goal for secondary prevention.  Going to increase her Crestor from 20 to 40 mg a day and we will recheck a lipid liver profile in 2 months.

## 2019-08-09 NOTE — Progress Notes (Signed)
08/09/2019 Deborah Elliott   Jul 26, 1946  [id number]  Primary Physician Burnard Bunting, MD Primary Cardiologist: Lorretta Harp MD [name], [place]  HPI:  Deborah Elliott is a 73 y.o.  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 I saw herin the office  07/23/2018. She is accompanied by her husband today.. She has obstructive sleep apnea on CPAP which she benefits from for which she sees Dr. Ellouise Newer. She has a history of CAD status post PCI and stenting of her LAD in 3 places by myself with Taxus drug-eluting stents back on Mar 14, 2010, after being admitted with unstable angina. She had a normal EF. She did have a 50% to 60% mid RCA lesion that was treated medically. Her other problems include hypertension and hyperlipidemia. She is otherwise asymptomatic.Dr. Reynaldo Minium follows her lipid profile closely.    She apparently needs a left total knee replacement scheduled for 08/08/2018 by Dr. Maureen Ralphs.  Given her lack of symptoms and the relative low risk of the procedure I am clearing her for this at low risk.  Since I saw her a year ago she continues to do well.  She denies chest pain or shortness of breath.  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  Current Meds  Medication Sig  . acetaminophen (TYLENOL) 500 MG tablet Take 500 mg by mouth daily as needed (pain).   . bismuth subsalicylate (PEPTO BISMOL) 262 MG/15ML suspension Take 10 mLs by mouth every 6 (six) hours as needed (upset stomach).  . clopidogrel (PLAVIX) 75 MG tablet TAKE 1 TABLET DAILY  . docusate sodium (COLACE) 100 MG capsule Take 1 capsule (100 mg total) by mouth 2 (two) times daily.  Marland Kitchen esomeprazole (NEXIUM) 20 MG capsule Take 20 mg by mouth daily as needed (indigestion).  . fosinopril (MONOPRIL) 20 MG tablet Take 20 mg by mouth daily.   . furosemide (LASIX) 40 MG tablet Take 20 mg  by mouth daily.   . meclizine (ANTIVERT) 25 MG tablet Take 25 mg by mouth 2 (two) times daily as needed for dizziness.   . metFORMIN (GLUCOPHAGE) 500 MG tablet Take 500 mg by mouth 2 (two) times daily.  . methocarbamol (ROBAXIN) 500 MG tablet Take 1 tablet (500 mg total) by mouth every 6 (six) hours as needed for muscle spasms.  . metoprolol succinate (TOPROL-XL) 50 MG 24 hr tablet Take 50 mg by mouth daily. Take with or immediately following a meal.  . ondansetron (ZOFRAN) 4 MG tablet Take 1 tablet (4 mg total) by mouth every 6 (six) hours as needed for nausea.  Marland Kitchen oxyCODONE (OXY IR/ROXICODONE) 5 MG immediate release tablet Take 1-2 tablets (5-10 mg total) by mouth every 6 (six) hours as needed for severe pain (pain score 4-6).  Marland Kitchen polyethylene glycol (MIRALAX / GLYCOLAX) packet Take 17 g by mouth daily as needed for mild constipation.  . potassium chloride SA (K-DUR,KLOR-CON) 20 MEQ tablet Take 20 mEq by mouth daily.  . psyllium (HYDROCIL/METAMUCIL) 95 % PACK Take 1 packet by mouth daily as needed (regularity).   . rosuvastatin (CRESTOR) 20 MG tablet Take 20 mg by mouth every evening.   . traMADol (ULTRAM) 50 MG tablet Take 1-2 tablets (50-100 mg total) by mouth every 6 (six) hours as needed for moderate pain (refractory to oxycodone).     Allergies  Allergen Reactions  .  Morphine And Related Other (See Comments)    Reaction unknown per patient  . Latex Rash    Blood blisters  . Sulfur Itching and Rash  . Tape Rash    Social History   Socioeconomic History  . Marital status: Married    Spouse name: Not on file  . Number of children: Not on file  . Years of education: Not on file  . Highest education level: Not on file  Occupational History  . Not on file  Social Needs  . Financial resource strain: Not on file  . Food insecurity    Worry: Not on file    Inability: Not on file  . Transportation needs    Medical: Not on file    Non-medical: Not on file  Tobacco Use  . Smoking  status: Former Smoker    Quit date: 08/23/1970    Years since quitting: 48.9  . Smokeless tobacco: Never Used  Substance and Sexual Activity  . Alcohol use: No  . Drug use: No  . Sexual activity: Not on file  Lifestyle  . Physical activity    Days per week: Not on file    Minutes per session: Not on file  . Stress: Not on file  Relationships  . Social Herbalist on phone: Not on file    Gets together: Not on file    Attends religious service: Not on file    Active member of club or organization: Not on file    Attends meetings of clubs or organizations: Not on file    Relationship status: Not on file  . Intimate partner violence    Fear of current or ex partner: Not on file    Emotionally abused: Not on file    Physically abused: Not on file    Forced sexual activity: Not on file  Other Topics Concern  . Not on file  Social History Narrative  . Not on file     Review of Systems: General: negative for chills, fever, night sweats or weight changes.  Cardiovascular: negative for chest pain, dyspnea on exertion, edema, orthopnea, palpitations, paroxysmal nocturnal dyspnea or shortness of breath Dermatological: negative for rash Respiratory: negative for cough or wheezing Urologic: negative for hematuria Abdominal: negative for nausea, vomiting, diarrhea, bright red blood per rectum, melena, or hematemesis Neurologic: negative for visual changes, syncope, or dizziness All other systems reviewed and are otherwise negative except as noted above.    Blood pressure (!) 142/88, pulse 68, temperature 97.7 F (36.5 C), height 5\' 8"  (1.727 m), weight 219 lb (99.3 kg).  General appearance: alert and no distress Neck: no adenopathy, no carotid bruit, no JVD, supple, symmetrical, trachea midline and thyroid not enlarged, symmetric, no tenderness/mass/nodules Lungs: clear to auscultation bilaterally Heart: regular rate and rhythm, S1, S2 normal, no murmur, click, rub or  gallop Extremities: extremities normal, atraumatic, no cyanosis or edema Pulses: 2+ and symmetric Skin: Skin color, texture, turgor normal. No rashes or lesions Neurologic: Alert and oriented X 3, normal strength and tone. Normal symmetric reflexes. Normal coordination and gait  EKG sinus rhythm 68 with left bundle branch block.  I personally reviewed this EKG.  ASSESSMENT AND PLAN:   Coronary artery disease History of CAD status post PCI stenting of her LAD in 3 places by myself with Taxus drug-eluting stents 03/14/2010 in the setting of unstable angina.  She did have normal EF, 56% mid RCA stenosis that was treated medically.  She denies chest  pain or shortness of breath.  Essential hypertension History of essential hypertension with blood pressure measured today 142/88.  She is on Monopril and metoprolol.  Dyslipidemia History of hyperlipidemia on Crestor 20 mg a day with lipid profile performed 04/25/2019 revealing total cholesterol 45, LDL 85 and HDL of 38 not at goal for secondary prevention.  Going to increase her Crestor from 20 to 40 mg a day and we will recheck a lipid liver profile in 2 months.      Lorretta Harp MD FACP,FACC,FAHA, [facility] 08/09/2019 11:13 AM

## 2019-08-09 NOTE — Assessment & Plan Note (Signed)
History of CAD status post PCI stenting of her LAD in 3 places by myself with Taxus drug-eluting stents 03/14/2010 in the setting of unstable angina.  She did have normal EF, 56% mid RCA stenosis that was treated medically.  She denies chest pain or shortness of breath.

## 2019-08-09 NOTE — Patient Instructions (Signed)
Medication Instructions:  Your physician has recommended you make the following change in your medication:   INCREASE YOUR ROSUVASTATIN TO 40 MG BY MOUTH DAILY  START COENZYME Q 10, 200 MG BY MOUTH DAILY  If you need a refill on your cardiac medications before your next appointment, please call your pharmacy.   Lab work: Your physician recommends that you return for lab work in 2 MONTHS: [place]  If you have labs (blood work) drawn today and your tests are completely normal, you will receive your results only by: Marland Kitchen MyChart Message (if you have MyChart) OR . A paper copy in the mail If you have any lab test that is abnormal or we need to change your treatment, we will call you to review the results.  Testing/Procedures: NONE  Follow-Up: At [facility], you and your health needs are our priority.  As part of our continuing mission to provide you with exceptional heart care, we have created designated Provider Care Teams.  These Care Teams include your primary Cardiologist (physician) and Advanced Practice Providers (APPs -  Physician Assistants and Nurse Practitioners) who all work together to provide you with the care you need, when you need it. You will need a follow up appointment in 12 months with Dr. Quay Burow.  Please call our office 2 months in advance to schedule this appointment.

## 2019-08-09 NOTE — Assessment & Plan Note (Signed)
History of essential hypertension with blood pressure measured today 142/88.  She is on Monopril and metoprolol.

## 2019-08-11 ENCOUNTER — Telehealth: Payer: Self-pay | Admitting: Cardiovascular Disease

## 2019-08-11 NOTE — Telephone Encounter (Signed)
Will route to Sleep Coordinator.

## 2019-08-11 NOTE — Telephone Encounter (Signed)
Graciella Belton is calling to follow up on a CPAP order request. She will refax order to Dr. Gwenlyn Found.

## 2019-08-19 ENCOUNTER — Telehealth: Payer: Self-pay | Admitting: Cardiovascular Disease

## 2019-08-19 NOTE — Telephone Encounter (Signed)
Christine from [facility] was calling to confirm that the office received a fax of a new rx for a cpap machine. The fax was sent to the office 08/16/19. She will be sending another fax today as well.  When calling to confirm, anyone who answers will be able to assist with the request

## 2019-09-05 ENCOUNTER — Other Ambulatory Visit: Payer: Self-pay | Admitting: Internal Medicine

## 2019-09-05 DIAGNOSIS — N6310 Unspecified lump in the right breast, unspecified quadrant: Secondary | ICD-10-CM

## 2019-10-03 ENCOUNTER — Other Ambulatory Visit: Payer: Self-pay | Admitting: Cardiovascular Disease

## 2019-10-31 ENCOUNTER — Other Ambulatory Visit: Payer: Medicare Other

## 2019-12-08 ENCOUNTER — Other Ambulatory Visit: Payer: Medicare Other

## 2019-12-14 ENCOUNTER — Emergency Department (HOSPITAL_COMMUNITY): Payer: Medicare Other

## 2019-12-14 ENCOUNTER — Emergency Department (HOSPITAL_COMMUNITY)
Admission: EM | Admit: 2019-12-14 | Discharge: 2019-12-14 | Disposition: A | Discharge status: Home / Self Care | Payer: Medicare Other | Attending: Emergency Medicine | Admitting: Emergency Medicine

## 2019-12-14 ENCOUNTER — Other Ambulatory Visit: Payer: Self-pay

## 2019-12-14 DIAGNOSIS — W0110XA Fall on same level from slipping, tripping and stumbling with subsequent striking against unspecified object, initial encounter: Secondary | ICD-10-CM | POA: Diagnosis not present

## 2019-12-14 DIAGNOSIS — Y939 Activity, unspecified: Secondary | ICD-10-CM | POA: Insufficient documentation

## 2019-12-14 DIAGNOSIS — I10 Essential (primary) hypertension: Secondary | ICD-10-CM | POA: Insufficient documentation

## 2019-12-14 DIAGNOSIS — Z7984 Long term (current) use of oral hypoglycemic drugs: Secondary | ICD-10-CM | POA: Diagnosis not present

## 2019-12-14 DIAGNOSIS — R55 Syncope and collapse: Secondary | ICD-10-CM | POA: Diagnosis present

## 2019-12-14 DIAGNOSIS — D696 Thrombocytopenia, unspecified: Secondary | ICD-10-CM | POA: Diagnosis not present

## 2019-12-14 DIAGNOSIS — Y999 Unspecified external cause status: Secondary | ICD-10-CM | POA: Insufficient documentation

## 2019-12-14 DIAGNOSIS — Z91040 Latex allergy status: Secondary | ICD-10-CM | POA: Diagnosis not present

## 2019-12-14 DIAGNOSIS — Z87891 Personal history of nicotine dependence: Secondary | ICD-10-CM | POA: Insufficient documentation

## 2019-12-14 DIAGNOSIS — I447 Left bundle-branch block, unspecified: Secondary | ICD-10-CM

## 2019-12-14 DIAGNOSIS — Z79899 Other long term (current) drug therapy: Secondary | ICD-10-CM | POA: Diagnosis not present

## 2019-12-14 DIAGNOSIS — Y929 Unspecified place or not applicable: Secondary | ICD-10-CM | POA: Insufficient documentation

## 2019-12-14 DIAGNOSIS — I2510 Atherosclerotic heart disease of native coronary artery without angina pectoris: Secondary | ICD-10-CM | POA: Insufficient documentation

## 2019-12-14 DIAGNOSIS — S0033XA Contusion of nose, initial encounter: Secondary | ICD-10-CM | POA: Insufficient documentation

## 2019-12-14 DIAGNOSIS — E119 Type 2 diabetes mellitus without complications: Secondary | ICD-10-CM | POA: Diagnosis not present

## 2019-12-14 DIAGNOSIS — S0083XA Contusion of other part of head, initial encounter: Secondary | ICD-10-CM

## 2019-12-14 LAB — CBC WITH DIFFERENTIAL/PLATELET
Abs Immature Granulocytes: 0.04 10*3/uL (ref 0.00–0.07)
Basophils Absolute: 0 10*3/uL (ref 0.0–0.1)
Basophils Relative: 0 %
Eosinophils Absolute: 0.3 10*3/uL (ref 0.0–0.5)
Eosinophils Relative: 3 %
HCT: 43.1 % (ref 36.0–46.0)
Hemoglobin: 14 g/dL (ref 12.0–15.0)
Immature Granulocytes: 0 %
Lymphocytes Relative: 20 %
Lymphs Abs: 2.2 10*3/uL (ref 0.7–4.0)
MCH: 29.4 pg (ref 26.0–34.0)
MCHC: 32.5 g/dL (ref 30.0–36.0)
MCV: 90.4 fL (ref 80.0–100.0)
Monocytes Absolute: 0.7 10*3/uL (ref 0.1–1.0)
Monocytes Relative: 7 %
Neutro Abs: 7.4 10*3/uL (ref 1.7–7.7)
Neutrophils Relative %: 70 %
Platelets: 139 10*3/uL — ABNORMAL LOW (ref 150–400)
RBC: 4.77 MIL/uL (ref 3.87–5.11)
RDW: 13.3 % (ref 11.5–15.5)
WBC: 10.6 10*3/uL — ABNORMAL HIGH (ref 4.0–10.5)
nRBC: 0 % (ref 0.0–0.2)

## 2019-12-14 LAB — BASIC METABOLIC PANEL
Anion gap: 11 (ref 5–15)
BUN: 12 mg/dL (ref 8–23)
CO2: 20 mmol/L — ABNORMAL LOW (ref 22–32)
Calcium: 8.9 mg/dL (ref 8.9–10.3)
Chloride: 109 mmol/L (ref 98–111)
Creatinine, Ser: 0.74 mg/dL (ref 0.44–1.00)
GFR calc Af Amer: >60 mL/min (ref >60)
GFR calc non Af Amer: >60 mL/min (ref >60)
Glucose, Bld: 197 mg/dL — ABNORMAL HIGH (ref 70–99)
Potassium: 3.6 mmol/L (ref 3.5–5.1)
Sodium: 140 mmol/L (ref 135–145)

## 2019-12-14 LAB — TROPONIN I (HIGH SENSITIVITY)
Troponin I (High Sensitivity): 8 ng/L (ref <18)
Troponin I (High Sensitivity): 8 ng/L (ref <18)

## 2019-12-14 MED ORDER — SODIUM CHLORIDE 0.9 % IV BOLUS
500.0000 mL | Freq: Once | INTRAVENOUS | Status: AC
  Administered 2019-12-14: 08:00:00 500 mL via INTRAVENOUS

## 2019-12-14 MED ORDER — ONDANSETRON HCL 4 MG PO TABS: 4.0000 mg | ORAL_TABLET | Freq: Three times a day (TID) | ORAL | 0 refills | Status: AC | PRN

## 2019-12-14 MED ORDER — ONDANSETRON HCL 4 MG/2ML IJ SOLN
4.0000 mg | Freq: Once | INTRAMUSCULAR | Status: AC
  Administered 2019-12-14: 08:00:00 4 mg via INTRAVENOUS
  Filled 2019-12-14: qty 2

## 2019-12-14 MED ORDER — SODIUM CHLORIDE 0.9 % IV BOLUS
1000.0000 mL | Freq: Once | INTRAVENOUS | Status: AC
  Administered 2019-12-14: 06:00:00 1000 mL via INTRAVENOUS

## 2019-12-14 MED ORDER — MECLIZINE HCL 25 MG PO TABS
25.0000 mg | ORAL_TABLET | Freq: Once | ORAL | Status: AC
  Administered 2019-12-14: 10:00:00 25 mg via ORAL
  Filled 2019-12-14: qty 1

## 2019-12-14 NOTE — ED Notes (Addendum)
Pt assisted to bedside commode. Experienced a syncopal episode, no fall. Patient became arousable and was transferred back to bed without incident. EDP notified. Pt is currently A&Ox4.

## 2019-12-14 NOTE — ED Notes (Signed)
Patient ambulated at this time; Patient unable to maintain proper gait, s/s dizziness and fatigue.

## 2019-12-14 NOTE — Discharge Instructions (Signed)
Suspect he might have mild concussion.  Continue to stay well-hydrated.  Use Zofran for nausea.  Use meclizine for dizziness.  Ambulate at all times with a walker and make sure you have assistance when needed.  Slowly change positions and avoid any rapid sudden head movements.

## 2019-12-14 NOTE — ED Provider Notes (Signed)
Patient signed out at 7 AM.  Patient with syncopal episode at home while using bathroom and following a bowel movement.  No other symptoms.  Hit her forehead as she does have hematoma.  She is not on any blood thinners.  Lab work thus far is unremarkable.  Patient has received fluid bolus.  Obtaining CT scan of her head face and neck.  Initial troponin normal.  Plan to follow-up imaging and get repeat troponin and reevaluate patient after fluids.  Suspect vasovagal event.  Lab work shows no significant change in troponin.  No chest pain.  CT imaging just showed large frontal scalp hematoma but otherwise no injuries.  Patient was able to ambulate with some dizziness but was able walk under her own power.  She does have a cane and walker at home.  She states that she has had some GI symptoms since eating a taco last night.  She has a history of vertigo as well and has Antivert at home which she has been taking intermittently over the last week as well.  Her symptoms are completely resolved at rest.  Neurologically she appears intact and have low suspicion for stroke.  Suspect vertigo and dehydration from GI issues making her feel not well today.  In addition now she has head injury and possibly a mild concussion.  She prefers discharged home which I think is reasonable given that she has a walker at home and has family member at her home as well.  Will prescribe Zofran and patient was discharged from the emergency department in good condition.  This chart was dictated using voice recognition software.  Despite best efforts to proofread,  errors can occur which can change the documentation meaning.      Lennice Sites, DO 12/14/19 1001

## 2019-12-14 NOTE — ED Triage Notes (Addendum)
Pt arrives via [facility] c/o fall with LOC at home. Pt was at home sitting on toilet and fell hitting head and torso. A&Ox4, GCS 15, VS stable. Zofran 4mg  administered PTA of EMS.

## 2019-12-14 NOTE — ED Notes (Signed)
Husband updated.

## 2019-12-14 NOTE — ED Notes (Signed)
Pt to CT

## 2019-12-14 NOTE — ED Provider Notes (Signed)
[facility] EMERGENCY DEPARTMENT Provider Note   CSN: [id number] Arrival date & time: 12/14/19  [id number]   History Chief Complaint  Patient presents with  . Fall  . Loss of Consciousness    Deborah Elliott is a 74 y.o. female.  The history is provided by the patient.  Fall  Loss of Consciousness She has history of hypertension, diabetes, hyperlipidemia, coronary artery disease, and was brought in by ambulance after having a syncopal episode at home.  Syncope occurred following a bowel movement.  Shortly after arriving in the ED, she had another syncopal episode while having a bowel movement on a bedside commode.  She apparently hit her forehead on something but she is not sure what.  She is not on any anticoagulants, but does take clopidogrel.  Past Medical History:  Diagnosis Date  . Achilles rupture, left 04/06/2012  . Arthritis of knee   . Cancer ([place])    skin cancer  . Coronary artery disease    X3 STENTS  . Diabetes mellitus without complication ([place])   . Dyspnea   . Dysrhythmia    HX of tachycardia - controlled with med  . GERD (gastroesophageal reflux disease)   . Hx of skin cancer, basal cell   . Hyperlipidemia   . Hypertension   . Leg cramps   . Obesity   . PONV (postoperative nausea and vomiting)   . Sleep apnea    uses c-pap   . Vertigo     Patient Active Problem List   Diagnosis Date Noted  . Postoperative anemia due to acute blood loss 08/30/2013  . OA (osteoarthritis) of knee 08/29/2013  . Coronary artery disease 03/24/2013  . Obesity (BMI 35.0-39.9 without comorbidity) 03/24/2013  . Essential hypertension 03/24/2013  . Dyslipidemia 03/24/2013    Past Surgical History:  Procedure Laterality Date  . APPENDECTOMY  48 YRS AGO  . COLONOSCOPY    . NM MYOCAR PERF WALL MOTION  05/27/2010   normal  . SKIN CANCER EXCISION    . STENTED CORONARY ARTERY  03/14/2010   X3 STENTS; tandem proximal and mid LAD  . TOTAL KNEE ARTHROPLASTY Right  08/29/2013   Procedure: RIGHT TOTAL KNEE ARTHROPLASTY;  Surgeon: Gearlean Alf, MD;  Location: [facility];  Service: Orthopedics;  Laterality: Right;  . TOTAL KNEE ARTHROPLASTY Left 08/16/2018   Procedure: LEFT TOTAL KNEE ARTHROPLASTY;  Surgeon: Gaynelle Arabian, MD;  Location: [facility];  Service: Orthopedics;  Laterality: Left;  26min     OB History   No obstetric history on file.     Family History  Problem Relation Age of Onset  . Hypertension Mother   . Diabetes Mother   . Stroke Mother   . Aneurysm Father   . Cancer Maternal Grandmother     Social History   Tobacco Use  . Smoking status: Former Smoker    Quit date: 08/23/1970    Years since quitting: 49.3  . Smokeless tobacco: Never Used  Substance Use Topics  . Alcohol use: No  . Drug use: No    Home Medications Prior to Admission medications   Medication Sig Start Date End Date Taking? Authorizing Provider  acetaminophen (TYLENOL) 500 MG tablet Take 500 mg by mouth daily as needed (pain).     [provider]  bismuth subsalicylate (PEPTO BISMOL) 262 MG/15ML suspension Take 10 mLs by mouth every 6 (six) hours as needed (upset stomach).    [provider]  clopidogrel (PLAVIX) 75 MG tablet  Take 1 tablet (75 mg total) by mouth daily. 10/03/19   Lorretta Harp, MD  Coenzyme Q10 200 MG capsule Take 1 capsule (200 mg total) by mouth daily. 08/09/19   Lorretta Harp, MD  docusate sodium (COLACE) 100 MG capsule Take 1 capsule (100 mg total) by mouth 2 (two) times daily. 08/20/18   Edmisten, Ok Anis, PA  esomeprazole (NEXIUM) 20 MG capsule Take 20 mg by mouth daily as needed (indigestion).    [provider]  fosinopril (MONOPRIL) 20 MG tablet Take 20 mg by mouth daily.     [provider]  furosemide (LASIX) 40 MG tablet Take 20 mg by mouth daily.     [provider]  meclizine (ANTIVERT) 25 MG tablet Take 25 mg by mouth 2 (two) times daily as needed for dizziness.  07/31/14    [provider]  metFORMIN (GLUCOPHAGE) 500 MG tablet Take 500 mg by mouth 2 (two) times daily.    [provider]  methocarbamol (ROBAXIN) 500 MG tablet Take 1 tablet (500 mg total) by mouth every 6 (six) hours as needed for muscle spasms. 08/19/18   Edmisten, Kristie L, PA  metoprolol succinate (TOPROL-XL) 50 MG 24 hr tablet Take 50 mg by mouth daily. Take with or immediately following a meal.    [provider]  ondansetron (ZOFRAN) 4 MG tablet Take 1 tablet (4 mg total) by mouth every 6 (six) hours as needed for nausea. 08/20/18   Edmisten, Kristie L, PA  oxyCODONE (OXY IR/ROXICODONE) 5 MG immediate release tablet Take 1-2 tablets (5-10 mg total) by mouth every 6 (six) hours as needed for severe pain (pain score 4-6). 08/19/18   Edmisten, Kristie L, PA  polyethylene glycol (MIRALAX / GLYCOLAX) packet Take 17 g by mouth daily as needed for mild constipation. 08/20/18   Edmisten, Kristie L, PA  potassium chloride SA (K-DUR,KLOR-CON) 20 MEQ tablet Take 20 mEq by mouth daily.    [provider]  psyllium (HYDROCIL/METAMUCIL) 95 % PACK Take 1 packet by mouth daily as needed (regularity).     [provider]  rosuvastatin (CRESTOR) 40 MG tablet Take 1 tablet (40 mg total) by mouth every evening. 08/09/19   Lorretta Harp, MD  traMADol (ULTRAM) 50 MG tablet Take 1-2 tablets (50-100 mg total) by mouth every 6 (six) hours as needed for moderate pain (refractory to oxycodone). 08/19/18   Edmisten, Kristie L, PA    Allergies    Morphine and related, Latex, Sulfur, and Tape  Review of Systems   Review of Systems  Cardiovascular: Positive for syncope.  All other systems reviewed and are negative.   Physical Exam Updated Vital Signs BP (!) 162/77   Pulse 71   Temp (!) 97.5 F (36.4 C) (Oral)   Resp (!) 24   Ht 5\' 6"  (1.676 m)   Wt 102.1 kg   SpO2 99%   BMI 36.32 kg/m   Physical Exam Vitals and nursing note reviewed.   74 year old female,  resting comfortably and in no acute distress. Vital signs are significant for elevated blood pressure and respiratory rate. Oxygen saturation is 99%, which is normal. Head is normocephalic.  Hematoma is present in the middle of the forehead, ecchymosis present over the bridge of the nose. PERRLA, EOMI. Oropharynx is clear. Neck is nontender without adenopathy or JVD. Back is nontender and there is no CVA tenderness. Lungs are clear without rales, wheezes, or rhonchi. Chest is nontender.  Moderate ecchymosis is  noted on the left upper lateral chest wall, but it appears to be 48-57 days old Heart has regular rate and rhythm without murmur. Abdomen is soft, flat, nontender without masses or hepatosplenomegaly and peristalsis is normoactive. Extremities have no cyanosis or edema, full range of motion is present. Skin is warm and dry without rash. Neurologic: Mental status is normal, cranial nerves are intact, there are no motor or sensory deficits.  ED Results / Procedures / Treatments   Labs (all labs ordered are listed, but only abnormal results are displayed) Labs Reviewed  BASIC METABOLIC PANEL - Abnormal; Notable for the following components:      Result Value   CO2 20 (*)    Glucose, Bld 197 (*)    All other components within normal limits  CBC WITH DIFFERENTIAL/PLATELET - Abnormal; Notable for the following components:   WBC 10.6 (*)    Platelets 139 (*)    All other components within normal limits  TROPONIN I (HIGH SENSITIVITY)  TROPONIN I (HIGH SENSITIVITY)    EKG EKG Interpretation  Date/Time:  Wednesday December 14 2019 05:33:34 EST Ventricular Rate:  74 PR Interval:    QRS Duration: 157 QT Interval:  449 QTC Calculation: 499 R Axis:   -56 Text Interpretation: Sinus rhythm Ventricular premature complex Left bundle branch block When compared with ECG of 01/17/2011, Premature ventricular complexes are now present Left bundle branch block is now present Confirmed by Delora Fuel ([id number]) on 12/14/2019 5:49:06 AM   Radiology DG Chest Port 1 View  Result Date: 12/14/2019 CLINICAL DATA:  Fall. EXAM: PORTABLE CHEST 1 VIEW COMPARISON:  08/23/2013 FINDINGS: Generous heart size accentuated by portable technique and leftward rotation. Negative mediastinal contours. Coronary stents are noted. Atherosclerotic calcification. There is no edema, consolidation, effusion, or pneumothorax. Artifact from EKG leads. No acute osseous finding. IMPRESSION: No active disease. Electronically Signed   By: Monte Fantasia M.D.   On: 12/14/2019 06:20    Procedures Procedures  Medications Ordered in ED Medications  sodium chloride 0.9 % bolus 1,000 mL (1,000 mLs Intravenous New Bag/Given 12/14/19 0554)    ED Course  I have reviewed the triage vital signs and the nursing notes.  Pertinent labs & imaging results that were available during my care of the patient were reviewed by me and considered in my medical decision making (see chart for details).  MDM Rules/Calculators/A&P Syncope which appears to have been vasovagal.  She did suffer significant facial trauma.  She is being sent for CT of head, facial bones, cervical spine.  Will check chest x-ray given evidence of chest trauma.  ECG shows new left bundle branch block, but most recent prior ECG was from 2012.  Will check screening labs.  Old records are reviewed, and she is followed by cardiology and ECG from their office on August 17, 2019 did show a left bundle branch block with similar morphology to today's ECG.  Chest x-ray is unremarkable.  Metabolic panel is unremarkable.  CBC is significant only for mild thrombocytopenia which is not felt to be clinically significant.  CT scans are pending.  Case is signed out to Dr. Ronnald Nian.  Final Clinical Impression(s) / ED Diagnoses Final diagnoses:  Vasovagal syncope  Contusion of forehead, initial encounter  Contusion of nose, initial encounter  Thrombocytopenia ([place])  Left bundle  branch block    Rx / DC Orders ED Discharge Orders    None       Delora Fuel, MD [id number] [phone]

## 2019-12-26 ENCOUNTER — Telehealth (HOSPITAL_COMMUNITY): Payer: Self-pay

## 2019-12-26 ENCOUNTER — Other Ambulatory Visit: Payer: Self-pay | Admitting: *Deleted

## 2019-12-26 DIAGNOSIS — I6529 Occlusion and stenosis of unspecified carotid artery: Secondary | ICD-10-CM

## 2019-12-26 NOTE — Telephone Encounter (Signed)

## 2019-12-27 ENCOUNTER — Ambulatory Visit (HOSPITAL_COMMUNITY)
Admission: RE | Admit: 2019-12-27 | Discharge: 2019-12-27 | Disposition: A | Discharge status: Home / Self Care | Payer: Medicare Other | Source: Ambulatory Visit | Attending: Vascular Surgery | Admitting: Vascular Surgery

## 2019-12-27 ENCOUNTER — Other Ambulatory Visit: Payer: Self-pay

## 2019-12-27 DIAGNOSIS — I6529 Occlusion and stenosis of unspecified carotid artery: Secondary | ICD-10-CM

## 2019-12-28 ENCOUNTER — Emergency Department (HOSPITAL_COMMUNITY): Payer: Medicare Other

## 2019-12-28 ENCOUNTER — Other Ambulatory Visit: Payer: Self-pay

## 2019-12-28 ENCOUNTER — Emergency Department (HOSPITAL_COMMUNITY)
Admission: EM | Admit: 2019-12-28 | Discharge: 2019-12-28 | Disposition: A | Discharge status: Home / Self Care | Payer: Medicare Other | Attending: Emergency Medicine | Admitting: Emergency Medicine

## 2019-12-28 DIAGNOSIS — W1839XA Other fall on same level, initial encounter: Secondary | ICD-10-CM | POA: Diagnosis not present

## 2019-12-28 DIAGNOSIS — Z7984 Long term (current) use of oral hypoglycemic drugs: Secondary | ICD-10-CM | POA: Diagnosis not present

## 2019-12-28 DIAGNOSIS — I119 Hypertensive heart disease without heart failure: Secondary | ICD-10-CM | POA: Insufficient documentation

## 2019-12-28 DIAGNOSIS — Z79899 Other long term (current) drug therapy: Secondary | ICD-10-CM | POA: Insufficient documentation

## 2019-12-28 DIAGNOSIS — Y92512 Supermarket, store or market as the place of occurrence of the external cause: Secondary | ICD-10-CM | POA: Diagnosis not present

## 2019-12-28 DIAGNOSIS — R55 Syncope and collapse: Secondary | ICD-10-CM | POA: Diagnosis not present

## 2019-12-28 DIAGNOSIS — Y9389 Activity, other specified: Secondary | ICD-10-CM | POA: Diagnosis not present

## 2019-12-28 DIAGNOSIS — I2510 Atherosclerotic heart disease of native coronary artery without angina pectoris: Secondary | ICD-10-CM | POA: Diagnosis not present

## 2019-12-28 DIAGNOSIS — S52502A Unspecified fracture of the lower end of left radius, initial encounter for closed fracture: Secondary | ICD-10-CM

## 2019-12-28 DIAGNOSIS — Y999 Unspecified external cause status: Secondary | ICD-10-CM | POA: Diagnosis not present

## 2019-12-28 DIAGNOSIS — E119 Type 2 diabetes mellitus without complications: Secondary | ICD-10-CM | POA: Diagnosis not present

## 2019-12-28 DIAGNOSIS — S52592A Other fractures of lower end of left radius, initial encounter for closed fracture: Secondary | ICD-10-CM | POA: Insufficient documentation

## 2019-12-28 DIAGNOSIS — S6992XA Unspecified injury of left wrist, hand and finger(s), initial encounter: Secondary | ICD-10-CM | POA: Diagnosis present

## 2019-12-28 LAB — CBC WITH DIFFERENTIAL/PLATELET
Abs Immature Granulocytes: 0.02 10*3/uL (ref 0.00–0.07)
Basophils Absolute: 0 10*3/uL (ref 0.0–0.1)
Basophils Relative: 0 %
Eosinophils Absolute: 0.4 10*3/uL (ref 0.0–0.5)
Eosinophils Relative: 6 %
HCT: 39.7 % (ref 36.0–46.0)
Hemoglobin: 12.8 g/dL (ref 12.0–15.0)
Immature Granulocytes: 0 %
Lymphocytes Relative: 23 %
Lymphs Abs: 1.7 10*3/uL (ref 0.7–4.0)
MCH: 30.2 pg (ref 26.0–34.0)
MCHC: 32.2 g/dL (ref 30.0–36.0)
MCV: 93.6 fL (ref 80.0–100.0)
Monocytes Absolute: 0.4 10*3/uL (ref 0.1–1.0)
Monocytes Relative: 5 %
Neutro Abs: 4.9 10*3/uL (ref 1.7–7.7)
Neutrophils Relative %: 66 %
Platelets: 134 10*3/uL — ABNORMAL LOW (ref 150–400)
RBC: 4.24 MIL/uL (ref 3.87–5.11)
RDW: 13.9 % (ref 11.5–15.5)
WBC: 7.4 10*3/uL (ref 4.0–10.5)
nRBC: 0 % (ref 0.0–0.2)

## 2019-12-28 LAB — BASIC METABOLIC PANEL
Anion gap: 8 (ref 5–15)
BUN: 9 mg/dL (ref 8–23)
CO2: 25 mmol/L (ref 22–32)
Calcium: 8.5 mg/dL — ABNORMAL LOW (ref 8.9–10.3)
Chloride: 108 mmol/L (ref 98–111)
Creatinine, Ser: 0.67 mg/dL (ref 0.44–1.00)
GFR calc Af Amer: >60 mL/min (ref >60)
GFR calc non Af Amer: >60 mL/min (ref >60)
Glucose, Bld: 139 mg/dL — ABNORMAL HIGH (ref 70–99)
Potassium: 3.8 mmol/L (ref 3.5–5.1)
Sodium: 141 mmol/L (ref 135–145)

## 2019-12-28 LAB — CBG MONITORING, ED: Glucose-Capillary: 104 mg/dL — ABNORMAL HIGH (ref 70–99)

## 2019-12-28 MED ORDER — FENTANYL CITRATE (PF) 100 MCG/2ML IJ SOLN
50.0000 ug | Freq: Once | INTRAMUSCULAR | Status: AC
  Administered 2019-12-28: 50 ug via INTRAVENOUS
  Filled 2019-12-28: qty 2

## 2019-12-28 MED ORDER — PROPOFOL 10 MG/ML IV BOLUS
1.0000 mg/kg | Freq: Once | INTRAVENOUS | Status: AC
  Administered 2019-12-28: 130 mg via INTRAVENOUS
  Filled 2019-12-28: qty 20

## 2019-12-28 MED ORDER — ONDANSETRON HCL 4 MG/2ML IJ SOLN
4.0000 mg | Freq: Once | INTRAMUSCULAR | Status: AC
  Administered 2019-12-28: 4 mg via INTRAVENOUS
  Filled 2019-12-28: qty 2

## 2019-12-28 MED ORDER — LIDOCAINE HCL (PF) 1 % IJ SOLN
30.0000 mL | Freq: Once | INTRAMUSCULAR | Status: DC
  Filled 2019-12-28: qty 30

## 2019-12-28 MED ORDER — OXYCODONE-ACETAMINOPHEN 5-325 MG PO TABS: 1.0000 | ORAL_TABLET | Freq: Four times a day (QID) | ORAL | 0 refills | Status: AC | PRN

## 2019-12-28 NOTE — Discharge Instructions (Signed)
Please call Dr. Ruthe Mannan office tomorrow morning to get a follow-up appointment regarding your wrist fracture.  Please call your primary care doctor tomorrow as well to get a follow-up appointment regarding your episode of almost passing out today.  Please keep your arm in sling and do not bear any weight in your left arm until further instructed by orthopedics.  Please keep your splint clean and dry, cannot get wet otherwise will lose its integrity.  Take pain medication as prescribed as needed for severe pain, please note this can make you drowsy and should not be taken while driving or operating heavy machinery.  For mild to moderate pain you should take Tylenol and Motrin.  If you develop numbness, weakness, significant swelling in your fingers, return to ER for recheck.  Additionally if you have any episodes of chest pain, difficulty in breathing or passing out, return to ER.

## 2019-12-28 NOTE — ED Provider Notes (Signed)
[facility] EMERGENCY DEPARTMENT Provider Note   CSN: [id number] Arrival date & time: 12/28/19  1516     History Chief Complaint  Patient presents with  . Wrist Pain    left    Deborah Elliott is a 74 y.o. female.  Presents to ER with chief complaint of left wrist pain.  States she was at the grocery store, went to the bathroom, had normal bowel movement but did require fair amount of straining.  When she got up from the toilet she felt lightheaded and almost passed out.  States that due to this lightheadedness she fell to the floor, fell on her outstretched left arm.  Denies any head injury.  Had some pain in her left hand, left upper and lower arm.  No neck or back pain, no abdominal pain.   HPI     Past Medical History:  Diagnosis Date  . Achilles rupture, left 04/06/2012  . Arthritis of knee   . Cancer ([place])    skin cancer  . Coronary artery disease    X3 STENTS  . Diabetes mellitus without complication ([place])   . Dyspnea   . Dysrhythmia    HX of tachycardia - controlled with med  . GERD (gastroesophageal reflux disease)   . Hx of skin cancer, basal cell   . Hyperlipidemia   . Hypertension   . Leg cramps   . Obesity   . PONV (postoperative nausea and vomiting)   . Sleep apnea    uses c-pap   . Vertigo     Patient Active Problem List   Diagnosis Date Noted  . Postoperative anemia due to acute blood loss 08/30/2013  . OA (osteoarthritis) of knee 08/29/2013  . Coronary artery disease 03/24/2013  . Obesity (BMI 35.0-39.9 without comorbidity) 03/24/2013  . Essential hypertension 03/24/2013  . Dyslipidemia 03/24/2013    Past Surgical History:  Procedure Laterality Date  . APPENDECTOMY  48 YRS AGO  . COLONOSCOPY    . NM MYOCAR PERF WALL MOTION  05/27/2010   normal  . SKIN CANCER EXCISION    . STENTED CORONARY ARTERY  03/14/2010   X3 STENTS; tandem proximal and mid LAD  . TOTAL KNEE ARTHROPLASTY Right 08/29/2013   Procedure: RIGHT TOTAL KNEE ARTHROPLASTY;   Surgeon: Gearlean Alf, MD;  Location: [facility];  Service: Orthopedics;  Laterality: Right;  . TOTAL KNEE ARTHROPLASTY Left 08/16/2018   Procedure: LEFT TOTAL KNEE ARTHROPLASTY;  Surgeon: Gaynelle Arabian, MD;  Location: [facility];  Service: Orthopedics;  Laterality: Left;  75min     OB History   No obstetric history on file.     Family History  Problem Relation Age of Onset  . Hypertension Mother   . Diabetes Mother   . Stroke Mother   . Aneurysm Father   . Cancer Maternal Grandmother     Social History   Tobacco Use  . Smoking status: Former Smoker    Quit date: 08/23/1970    Years since quitting: 49.3  . Smokeless tobacco: Never Used  Substance Use Topics  . Alcohol use: No  . Drug use: No    Home Medications Prior to Admission medications   Medication Sig Start Date End Date Taking? Authorizing Provider  acetaminophen (TYLENOL) 500 MG tablet Take 500 mg by mouth daily as needed (pain).    Yes [provider]  clopidogrel (PLAVIX) 75 MG tablet Take 1 tablet (75 mg total) by mouth daily. Patient taking differently: Take 75 mg by mouth  in the morning.  10/03/19  Yes Lorretta Harp, MD  Coenzyme Q10 200 MG capsule Take 1 capsule (200 mg total) by mouth daily. Patient taking differently: Take 200 mg by mouth in the morning.  08/09/19  Yes Lorretta Harp, MD  esomeprazole (NEXIUM) 20 MG capsule Take 20 mg by mouth in the morning.    Yes [provider]  fosinopril (MONOPRIL) 20 MG tablet Take 20 mg by mouth in the morning.    Yes [provider]  furosemide (LASIX) 40 MG tablet Take 20 mg by mouth in the morning.    Yes [provider]  meclizine (ANTIVERT) 25 MG tablet Take 25 mg by mouth 2 (two) times daily as needed for dizziness.  07/31/14  Yes [provider]  metFORMIN (GLUCOPHAGE) 500 MG tablet Take 500 mg by mouth 2 (two) times daily.   Yes [provider]  metoprolol succinate (TOPROL-XL) 50 MG 24 hr tablet Take  50 mg by mouth in the morning. Take with or immediately following a meal.   Yes [provider]  ondansetron (ZOFRAN) 4 MG tablet Take 1 tablet (4 mg total) by mouth every 8 (eight) hours as needed for nausea or vomiting. 12/14/19  Yes Curatolo, Adam, DO  potassium chloride SA (K-DUR,KLOR-CON) 20 MEQ tablet Take 20 mEq by mouth in the morning.    Yes [provider]  rosuvastatin (CRESTOR) 40 MG tablet Take 1 tablet (40 mg total) by mouth every evening. 08/09/19  Yes Lorretta Harp, MD  oxyCODONE-acetaminophen (PERCOCET) 5-325 MG tablet Take 1 tablet by mouth every 6 (six) hours as needed for up to 3 days for severe pain. 12/28/19 12/31/19  Lucrezia Starch, MD    Allergies    Morphine and related, Latex, Sulfur, and Tape  Review of Systems   Review of Systems  Constitutional: Negative for chills and fever.  HENT: Negative for ear pain and sore throat.   Eyes: Negative for pain and visual disturbance.  Respiratory: Negative for cough and shortness of breath.   Cardiovascular: Negative for chest pain and palpitations.  Gastrointestinal: Negative for abdominal pain and vomiting.  Genitourinary: Negative for dysuria and hematuria.  Musculoskeletal: Positive for arthralgias. Negative for back pain.  Skin: Negative for color change and rash.  Neurological: Negative for seizures and syncope.  All other systems reviewed and are negative.   Physical Exam Updated Vital Signs BP 104/75 (BP Location: Right Arm)   Pulse 70   Temp (!) 97.3 F (36.3 C) (Axillary)   Resp (!) 24   Ht 5\' 6"  (1.676 m)   Wt 103.4 kg   SpO2 95%   BMI 36.80 kg/m   Physical Exam Vitals and nursing note reviewed.  Constitutional:      General: She is not in acute distress.    Appearance: She is well-developed.  HENT:     Head: Normocephalic and atraumatic.  Eyes:     Conjunctiva/sclera: Conjunctivae normal.  Cardiovascular:     Rate and Rhythm: Normal rate and regular rhythm.     Heart  sounds: No murmur.  Pulmonary:     Effort: Pulmonary effort is normal. No respiratory distress.     Breath sounds: Normal breath sounds.  Abdominal:     General: Abdomen is flat.     Palpations: Abdomen is soft.     Tenderness: There is no abdominal tenderness.  Musculoskeletal:     Cervical back: Neck supple.     Comments: LUE: swelling over  base of left hand/wrist, TTP over base of hand and wrist, TTP over upper arm, normal shoulder and elbow ROM, wrist ROM limited 2/2 pain, normal radial pulse, distal cap refill and sensation intact RUE: no deformity, no TTP LLE:no deformity, no TTP RLE: no deformity, no TTP Back: no TTP in C, T, L spine  Skin:    General: Skin is warm and dry.     Capillary Refill: Capillary refill takes less than 2 seconds.  Neurological:     General: No focal deficit present.     Mental Status: She is alert and oriented to person, place, and time.  Psychiatric:        Mood and Affect: Mood normal.        Behavior: Behavior normal.     ED Results / Procedures / Treatments   Labs (all labs ordered are listed, but only abnormal results are displayed) Labs Reviewed  CBC WITH DIFFERENTIAL/PLATELET - Abnormal; Notable for the following components:      Result Value   Platelets 134 (*)    All other components within normal limits  BASIC METABOLIC PANEL - Abnormal; Notable for the following components:   Glucose, Bld 139 (*)    Calcium 8.5 (*)    All other components within normal limits  CBG MONITORING, ED - Abnormal; Notable for the following components:   Glucose-Capillary 104 (*)    All other components within normal limits    EKG EKG Interpretation  Date/Time:  Wednesday December 28 2019 15:20:43 EST Ventricular Rate:  59 PR Interval:    QRS Duration: 118 QT Interval:  443 QTC Calculation: 439 R Axis:   -50 Text Interpretation: Sinus or ectopic atrial rhythm LVH with IVCD, LAD and secondary repol abnrm No acute changes Confirmed by Madalyn Rob [phone]) on 12/28/2019 4:01:17 PM   Radiology DG Ribs Unilateral W/Chest Left  Result Date: 12/28/2019 CLINICAL DATA:  Fall EXAM: LEFT RIBS AND CHEST - 3+ VIEW COMPARISON:  Chest x-ray 08/23/2013, 12/14/2019 FINDINGS: Single-view chest demonstrates mildly low lung volumes. No consolidation or effusion. Normal heart size. Aortic atherosclerosis. No pneumothorax. Left rib series demonstrates no definite acute displaced left rib fracture IMPRESSION: 1. Negative for pneumothorax or pleural effusion. 2. No definite acute displaced left rib fracture Electronically Signed   By: Donavan Foil M.D.   On: 12/28/2019 17:56   DG Forearm Left  Result Date: 12/28/2019 CLINICAL DATA:  Fall with pain EXAM: LEFT FOREARM - 2 VIEW COMPARISON:  None. FINDINGS: Acute comminuted intra-articular distal radius fracture with dorsal angulation of distal fracture fragment and about 1/3 shaft diameter of dorsal displacement. Acute nondisplaced ulnar styloid process fracture. Soft tissue swelling is present. IMPRESSION: 1. Acute displaced and angulated intra-articular distal radius fracture 2. Acute nondisplaced ulnar styloid process fracture Electronically Signed   By: Donavan Foil M.D.   On: 12/28/2019 17:53   DG Wrist Complete Left  Result Date: 12/28/2019 CLINICAL DATA:  74 year old female post reduction left wrist fracture. EXAM: LEFT WRIST - COMPLETE 3+ VIEW COMPARISON:  1705 hours today. FINDINGS: Cast or splint material now projects about the left hand and forearm. Substantially improved alignment at the comminuted distal left radius fracture, largely resolved dorsal angulation. Slight dorsal displacement. Suspicion of associated acute but relatively nondisplaced fracture of the distal ulna. Stable carpal bones. IMPRESSION: 1. Cast or splint material placed with improved alignment at the distal left radius fracture, largely resolved dorsal angulation. 2. Suspicion of nondisplaced acute distal ulna fracture.  Electronically Signed  By: Genevie Ann M.D.   On: 12/28/2019 19:28   DG Humerus Left  Result Date: 12/28/2019 CLINICAL DATA:  Fall EXAM: LEFT HUMERUS - 2+ VIEW COMPARISON:  None. FINDINGS: There is no evidence of fracture or other focal bone lesions. Soft tissues are unremarkable. IMPRESSION: Negative. Electronically Signed   By: Donavan Foil M.D.   On: 12/28/2019 17:54   DG Hand Complete Left  Result Date: 12/28/2019 CLINICAL DATA:  Hand pain after fall EXAM: LEFT HAND - COMPLETE 3+ VIEW COMPARISON:  None. FINDINGS: No acute displaced fracture or malalignment within the hand. Joint space narrowing at the D IP and PIP joints. Acute comminuted intra-articular distal radius fracture with dorsal displacement and angulation of distal fragment. Acute nondisplaced fracture involving the ulnar styloid. IMPRESSION: 1. Acute comminuted intra-articular distal radius fracture with angulation and displacement 2. Acute nondisplaced ulnar styloid process fracture Electronically Signed   By: Donavan Foil M.D.   On: 12/28/2019 17:52   VAS US CAROTID  Result Date: 12/27/2019 Carotid Arterial Duplex Study Indications:       Syncope and vertigo. Comparison Study:  No previous exam. Performing Technologist: Ralene Cork RVT  Examination Guidelines: A complete evaluation includes B-mode imaging, spectral Doppler, color Doppler, and power Doppler as needed of all accessible portions of each vessel. Bilateral testing is considered an integral part of a complete examination. Limited examinations for reoccurring indications may be performed as noted.  Right Carotid Findings: +----------+--------+--------+--------+------------------+--------+           PSV cm/sEDV cm/sStenosisPlaque DescriptionComments +----------+--------+--------+--------+------------------+--------+ CCA Prox  91      21                                tortuous +----------+--------+--------+--------+------------------+--------+ CCA Mid   80       20                                         +----------+--------+--------+--------+------------------+--------+ CCA Distal73      16                                         +----------+--------+--------+--------+------------------+--------+ ICA Prox  75      13      1-39%   heterogenous               +----------+--------+--------+--------+------------------+--------+ ICA Mid   85      20                                         +----------+--------+--------+--------+------------------+--------+ ICA Distal81      28                                         +----------+--------+--------+--------+------------------+--------+ ECA       98      14                                         +----------+--------+--------+--------+------------------+--------+ +----------+--------+-------+----------------+-------------------+  PSV cm/sEDV cmsDescribe        Arm Pressure (mmHG) +----------+--------+-------+----------------+-------------------+ [id number]            Multiphasic, WNL                    +----------+--------+-------+----------------+-------------------+ +---------+--------+--+--------+--+---------+ VertebralPSV cm/s79EDV cm/s14Antegrade +---------+--------+--+--------+--+---------+  Left Carotid Findings: +----------+--------+--------+--------+------------------+--------+           PSV cm/sEDV cm/sStenosisPlaque DescriptionComments +----------+--------+--------+--------+------------------+--------+ CCA Prox  68      13                                tortuous +----------+--------+--------+--------+------------------+--------+ CCA Mid   99      17                                         +----------+--------+--------+--------+------------------+--------+ CCA Distal97      21                                         +----------+--------+--------+--------+------------------+--------+ ICA Prox  87      19      1-39%    heterogenous               +----------+--------+--------+--------+------------------+--------+ ICA Mid   71      23                                tortuous +----------+--------+--------+--------+------------------+--------+ ICA Distal66      19                                tortuous +----------+--------+--------+--------+------------------+--------+ ECA       100     16                                         +----------+--------+--------+--------+------------------+--------+ +----------+--------+--------+----------------+-------------------+           PSV cm/sEDV cm/sDescribe        Arm Pressure (mmHG) +----------+--------+--------+----------------+-------------------+ [id number]             Multiphasic, WNL                    +----------+--------+--------+----------------+-------------------+ +---------+--------+--+--------+--+---------+ VertebralPSV cm/s56EDV cm/s12Antegrade +---------+--------+--+--------+--+---------+   Summary: Right Carotid: Velocities in the right ICA are consistent with a 1-39% stenosis. Left Carotid: Velocities in the left ICA are consistent with a 1-39% stenosis. Vertebrals:  Bilateral vertebral arteries demonstrate antegrade flow. Subclavians: Normal flow hemodynamics were seen in bilateral subclavian              arteries. *See table(s) above for measurements and observations.  Electronically signed by Curt Jews MD on 12/27/2019 at 2:22:25 PM.    Final     Procedures .Sedation  Date/Time: 12/28/2019 8:20 PM Performed by: Lucrezia Starch, MD Authorized by: Lucrezia Starch, MD   Consent:    Consent obtained:  Verbal and written   Consent given by:  Patient   Risks discussed:  Allergic reaction, dysrhythmia, inadequate sedation, nausea, vomiting, respiratory compromise necessitating ventilatory assistance and intubation,  prolonged hypoxia resulting in organ damage and prolonged sedation necessitating reversal   Alternatives  discussed:  Analgesia without sedation, anxiolysis and regional anesthesia Universal protocol:    Immediately prior to procedure a time out was called: yes     Patient identity confirmation method:  Arm band and verbally with patient Indications:    Procedure performed:  Fracture reduction Pre-sedation assessment:    Time since last food or drink:  7 hours   ASA classification: class 2 - patient with mild systemic disease     Mouth opening:  3 or more finger widths   Thyromental distance:  3 finger widths   Mallampati score:  I - soft palate, uvula, fauces, pillars visible   Pre-sedation assessments completed and reviewed: airway patency, cardiovascular function, hydration status, mental status, nausea/vomiting, pain level, respiratory function and temperature   Immediate pre-procedure details:    Reviewed: vital signs, relevant labs/tests and NPO status     Verified: bag valve mask available, emergency equipment available, intubation equipment available, IV patency confirmed, oxygen available, reversal medications available and suction available   Procedure details (see MAR for exact dosages):    Preoxygenation:  Nasal cannula   Sedation:  Propofol   Intended level of sedation: moderate (conscious sedation)   Analgesia:  Fentanyl   Intra-procedure monitoring:  Blood pressure monitoring, cardiac monitor, continuous pulse oximetry, continuous capnometry, frequent LOC assessments and frequent vital sign checks   Intra-procedure events: none     Intra-procedure management:  Airway repositioning   Total Provider sedation time (minutes):  15 Post-procedure details:    Recovery: Patient returned to pre-procedure baseline     Patient is stable for discharge or admission: yes     Patient tolerance:  Tolerated well, no immediate complications Reduction of fracture  Date/Time: 12/28/2019 8:23 PM Performed by: Lucrezia Starch, MD Authorized by: Lucrezia Starch, MD  Consent: Verbal consent  obtained. Written consent obtained. Risks and benefits: risks, benefits and alternatives were discussed Consent given by: patient Patient understanding: patient states understanding of the procedure being performed Patient identity confirmed: verbally with patient and arm band Local anesthesia used: yes Anesthesia: hematoma block  Anesthesia: Local anesthesia used: yes Local Anesthetic: lidocaine 1% without epinephrine Anesthetic total: 5 mL  Sedation: Patient sedated: yes Sedation type: moderate (conscious) sedation Sedatives: propofol Analgesia: fentanyl  Patient tolerance: patient tolerated the procedure well with no immediate complications Comments: Completed closed reduction of distal radius fracture. Applied distal traction with counter traction as well as applied force over dorsal aspect of distal fracture segment, felt adequate alignment confirmed by XR. Post procedure neurovascularly intact.  Marland KitchenSplint Application  Date/Time: 12/28/2019 8:34 PM Performed by: Lucrezia Starch, MD Authorized by: Lucrezia Starch, MD   Consent:    Consent obtained:  Verbal   Consent given by:  Patient   Risks discussed:  Discoloration, numbness, pain and swelling   Alternatives discussed:  No treatment and delayed treatment Procedure details:    Laterality:  Left   Location:  Arm   Arm:  L upper arm   Cast type:  Short arm   Splint type:  Sugar tong   Supplies:  Ortho-Glass Post-procedure details:    Pain:  Improved   Sensation:  Normal   Patient tolerance of procedure:  Tolerated well, no immediate complications   (including critical care time)  Medications Ordered in ED Medications  lidocaine (PF) (XYLOCAINE) 1 % injection 30 mL (has no administration in time range)  fentaNYL (SUBLIMAZE) injection  50 mcg (50 mcg Intravenous Given 12/28/19 1557)  ondansetron (ZOFRAN) injection 4 mg (4 mg Intravenous Given 12/28/19 1828)  fentaNYL (SUBLIMAZE) injection 50 mcg (50 mcg  Intravenous Given 12/28/19 1829)  propofol (DIPRIVAN) 10 mg/mL bolus/IV push 103.4 mg (130 mg Intravenous Given 12/28/19 1843)    ED Course  I have reviewed the triage vital signs and the nursing notes.  Pertinent labs & imaging results that were available during my care of the patient were reviewed by me and considered in my medical decision making (see chart for details).    MDM Rules/Calculators/A&P                       74 year old lady presenting to ER after near syncopal episode, fall on outstretched left hand.  Here patient was well-appearing, no further episodes of syncope.  Suspect vasovagal episode given description of positional nature, occurring after bowel movement.  No events on telemetry monitoring, EKG without obvious cause for syncope.  Recommended close follow-up with primary doctor regarding syncope.  Regarding trauma, x-rays were concerning for displaced distal radius fracture with intra-articular extension.  Reviewed with Dr. Aline Brochure, orthopedics who recommended reduction in ER and close follow-up in his clinic.  Performed hematoma block, attempted closed reduction however patient had difficulty tolerating due to persistent pain.  Proceeded with procedural sedation using propofol.  Patient tolerated this procedural sedation as well as the reduction attempt very well.  Post procedure, neurovascularly intact, x-ray confirmed good reduction of fracture.  Placed in sugar tong splint.  Reviewed return precautions, need for close outpt f/u.     After the discussed management above, the patient was determined to be safe for discharge.  The patient was in agreement with this plan and all questions regarding their care were answered.  ED return precautions were discussed and the patient will return to the ED with any significant worsening of condition.   Final Clinical Impression(s) / ED Diagnoses Final diagnoses:  Closed fracture of distal end of left radius, unspecified fracture  morphology, initial encounter  Near syncope    Rx / DC Orders ED Discharge Orders         Ordered    oxyCODONE-acetaminophen (PERCOCET) 5-325 MG tablet  Every 6 hours PRN     12/28/19 1939           Lucrezia Starch, MD 12/28/19 2036

## 2019-12-28 NOTE — ED Triage Notes (Signed)
Patient presents to the ED via [facility] due to dizziness and a fall from standing injuring left wrist, shoulder and left rib. Patient denies hitting head.  Patient reports the same type of dizziness a few ago injuring her head, patient was treated at [facility].

## 2019-12-28 NOTE — ED Notes (Signed)
Sling applied to L arm

## 2020-01-02 ENCOUNTER — Other Ambulatory Visit: Payer: Self-pay | Admitting: Orthopedic Surgery

## 2020-01-02 DIAGNOSIS — M25512 Pain in left shoulder: Secondary | ICD-10-CM

## 2020-01-02 DIAGNOSIS — S52502A Unspecified fracture of the lower end of left radius, initial encounter for closed fracture: Secondary | ICD-10-CM | POA: Insufficient documentation

## 2020-01-03 ENCOUNTER — Other Ambulatory Visit: Payer: Self-pay

## 2020-01-03 ENCOUNTER — Ambulatory Visit
Admission: RE | Admit: 2020-01-03 | Discharge: 2020-01-03 | Disposition: A | Discharge status: Home / Self Care | Payer: Medicare Other | Source: Ambulatory Visit | Attending: Orthopedic Surgery | Admitting: Orthopedic Surgery

## 2020-01-03 DIAGNOSIS — M25512 Pain in left shoulder: Secondary | ICD-10-CM

## 2020-01-04 ENCOUNTER — Other Ambulatory Visit (HOSPITAL_COMMUNITY)
Admission: RE | Admit: 2020-01-04 | Discharge: 2020-01-04 | Disposition: A | Discharge status: Home / Self Care | Payer: Medicare Other | Source: Ambulatory Visit | Attending: Orthopedic Surgery | Admitting: Orthopedic Surgery

## 2020-01-04 DIAGNOSIS — Z01812 Encounter for preprocedural laboratory examination: Secondary | ICD-10-CM | POA: Diagnosis present

## 2020-01-04 DIAGNOSIS — Z20822 Contact with and (suspected) exposure to covid-19: Secondary | ICD-10-CM | POA: Diagnosis not present

## 2020-01-04 LAB — SARS CORONAVIRUS 2 (TAT 6-24 HRS): SARS Coronavirus 2: NEGATIVE

## 2020-01-05 ENCOUNTER — Encounter (HOSPITAL_COMMUNITY): Payer: Self-pay | Admitting: Orthopedic Surgery

## 2020-01-05 MED ORDER — DEXTROSE 5 % IV SOLN
3.0000 g | INTRAVENOUS | Status: AC
  Administered 2020-01-06: 15:00:00 3 g via INTRAVENOUS
  Filled 2020-01-05: qty 3000
  Filled 2020-01-05: qty 3

## 2020-01-05 NOTE — Progress Notes (Signed)
Anesthesia Chart Review: Same day workup  Follows with cardiology for hx of OSA on CPAP, LBBB, CAD s/p PCI stenting of her LAD with DES x 3 03/14/2010 in the setting of unstable angina.  She did have normal EF, 56% mid RCA stenosis that was treated medically.  Last seen by Dr. Gwenlyn Found 08/09/19, doing well at that time and was cleared for TKA.   Pt seen in ED 12/14/19 for syncopal episode that occurred after bowel movement. Felt to be vasovagal. Workup was benign and she was discharged. She was seen again in the ED 12/28/19 for near-syncopal event following bowel movement. Pt states she stood up too quickly and got lightheaded and fell. Fall resulted in displaced Left distal radius fracture with intra-articular extension. Again her pre syncopal episode was felt to be vasovagal, carotid US with bilateral 1-39% stenosis, no events on telemetry monitoring.   Labs from 12/28/19 ED visit reviewed, unremarkable.  Will need DOS eval.   EKG 12/28/19: Sinus or ectopic atrial rhythm. Rate 59. LVH with IVCD, LAD and secondary repol abnrm  EKG 12/14/19: Sinus rhythm. Rate 71. Prolonged PR interval. Left bundle branch block  Carotid US 12/27/19: Summary:  Right Carotid: Velocities in the right ICA are consistent with a 1-39%  stenosis.   Left Carotid: Velocities in the left ICA are consistent with a 1-39%  stenosis.   Vertebrals: Bilateral vertebral arteries demonstrate antegrade flow.  Subclavians: Normal flow hemodynamics were seen in bilateral subclavian        arteries.    Wynonia Musty [facility] Short Stay Center/Anesthesiology Phone [phone] 01/05/2020 2:56 PM

## 2020-01-05 NOTE — Anesthesia Preprocedure Evaluation (Addendum)
Anesthesia Evaluation  Patient identified by MRN, date of birth, ID band Patient awake    Reviewed: Allergy & Precautions, NPO status , Patient's Chart, lab work & pertinent test results  History of Anesthesia Complications (+) PONV and history of anesthetic complications  Airway Mallampati: II  TM Distance: >3 FB Neck ROM: Full    Dental no notable dental hx.    Pulmonary sleep apnea and Continuous Positive Airway Pressure Ventilation , former smoker,    Pulmonary exam normal breath sounds clear to auscultation       Cardiovascular hypertension, Pt. on medications and Pt. on home beta blockers + CAD and + Cardiac Stents (x 3 )  Normal cardiovascular exam Rhythm:Regular Rate:Normal  ECG: rate 59   Neuro/Psych negative neurological ROS  negative psych ROS   GI/Hepatic Neg liver ROS, GERD  Medicated and Controlled,  Endo/Other  diabetes, Oral Hypoglycemic Agents  Renal/GU negative Renal ROS     Musculoskeletal  (+) Arthritis ,   Abdominal (+) + obese,   Peds  Hematology HLD   Anesthesia Other Findings Left distal radius fracture  Reproductive/Obstetrics                           Anesthesia Physical Anesthesia Plan  ASA: III  Anesthesia Plan: Regional   Post-op Pain Management:    Induction: Intravenous  PONV Risk Score and Plan: 3 and Ondansetron, Dexamethasone, Midazolam and Treatment may vary due to age or medical condition  Airway Management Planned: Simple Face Mask  Additional Equipment:   Intra-op Plan:   Post-operative Plan:   Informed Consent: I have reviewed the patients History and Physical, chart, labs and discussed the procedure including the risks, benefits and alternatives for the proposed anesthesia with the patient or authorized representative who has indicated his/her understanding and acceptance.     Dental advisory given  Plan Discussed with:  CRNA  Anesthesia Plan Comments: (Per PA-C:  Follows with cardiology for hx of OSA on CPAP, LBBB, CAD s/p PCI stenting of her LAD with DES x 3 03/14/2010 in the setting of unstable angina.  She did have normal EF, 56% mid RCA stenosis that was treated medically.  Last seen by Dr. Gwenlyn Found 08/09/19, doing well at that time and was cleared for TKA.   Pt seen in ED 12/14/19 for syncopal episode that occurred after bowel movement. Felt to be vasovagal. Workup was benign and she was discharged. She was seen again in the ED 12/28/19 for near-syncopal event following bowel movement. Pt states she stood up too quickly and got lightheaded and fell. Fall resulted in displaced Left distal radius fracture with intra-articular extension. Again her pre syncopal episode was felt to be vasovagal, carotid US with bilateral 1-39% stenosis, no events on telemetry monitoring.   Labs from 12/28/19 ED visit reviewed, unremarkable.  Will need DOS eval.   EKG 12/28/19: Sinus or ectopic atrial rhythm. Rate 59. LVH with IVCD, LAD and secondary repol abnrm  EKG 12/14/19: Sinus rhythm. Rate 71. Prolonged PR interval. Left bundle branch block  Carotid US 12/27/19: Summary:  Right Carotid: Velocities in the right ICA are consistent with a 1-39%  stenosis.   Left Carotid: Velocities in the left ICA are consistent with a 1-39%  stenosis.   Vertebrals: Bilateral vertebral arteries demonstrate antegrade flow.  Subclavians: Normal flow hemodynamics were seen in bilateral subclavian        arteries. )      Anesthesia Quick Evaluation

## 2020-01-05 NOTE — Progress Notes (Addendum)
Deborah Elliott denies chest pain or shortness of breath.  Patient tested negative for Covid 01/04/2020 and has been in quarantine with her husband since the the test.  Deborah Elliott has had do falls in the bathroom this month, one was 3/1/202 after straining for a bowel movement; the second one was 12/28/2019 as she was standing up from the toilet, she fell and fracture her left radius. Patient has a history of vertigo, patient took a Meclizine after the fall. Deborah Elliott said she thinks she just stood up to fast.  I asked patient if she has seen cardiologist since that time, patient  reports that she has not, but did see her PCP, Dr. Reynaldo Minium. Dr Reynaldo Minium order a Carotid ultrasound, I requested records from Dr. Jacquiline Doe office.  Deborah. Elliott has type II diabetes.  I instructed patient to not take Metformin in the am.  I instructed patient to check CBG after awaking and every 2 hours until arrival  to the hospital.  I Instructed patient if CBG is less than 70 to drink 1/2 cup of a clear juice. Recheck CBG in 15 minutes then call pre- op desk at [phone] for further instructions.   I instructed Deborah. Elliott to not eat after midnight. I instructed patient he/ she may drink clear liquids until 3 hours prior to surgery. We went over what beverages are 'clear liquids'.  Patient reports that she likes Pepsi and water.  Mr Steckel reported that Deborah. Elliott was not instructed to hold Plavix.

## 2020-01-06 ENCOUNTER — Ambulatory Visit (HOSPITAL_COMMUNITY): Payer: Medicare Other | Admitting: Physician Assistant

## 2020-01-06 ENCOUNTER — Ambulatory Visit (HOSPITAL_COMMUNITY)
Admission: RE | Admit: 2020-01-06 | Discharge: 2020-01-06 | Disposition: A | Discharge status: Home / Self Care | Payer: Medicare Other | Attending: Orthopedic Surgery | Admitting: Orthopedic Surgery

## 2020-01-06 ENCOUNTER — Encounter (HOSPITAL_COMMUNITY)
Admission: RE | Disposition: A | Discharge status: Home / Self Care | Payer: Self-pay | Source: Home / Self Care | Attending: Orthopedic Surgery

## 2020-01-06 ENCOUNTER — Other Ambulatory Visit: Payer: Self-pay

## 2020-01-06 ENCOUNTER — Encounter (HOSPITAL_COMMUNITY): Payer: Self-pay | Admitting: Orthopedic Surgery

## 2020-01-06 DIAGNOSIS — Z4789 Encounter for other orthopedic aftercare: Secondary | ICD-10-CM | POA: Insufficient documentation

## 2020-01-06 DIAGNOSIS — Z79899 Other long term (current) drug therapy: Secondary | ICD-10-CM | POA: Insufficient documentation

## 2020-01-06 DIAGNOSIS — Z885 Allergy status to narcotic agent status: Secondary | ICD-10-CM | POA: Diagnosis not present

## 2020-01-06 DIAGNOSIS — Z809 Family history of malignant neoplasm, unspecified: Secondary | ICD-10-CM | POA: Insufficient documentation

## 2020-01-06 DIAGNOSIS — Z8249 Family history of ischemic heart disease and other diseases of the circulatory system: Secondary | ICD-10-CM | POA: Insufficient documentation

## 2020-01-06 DIAGNOSIS — Z882 Allergy status to sulfonamides status: Secondary | ICD-10-CM | POA: Insufficient documentation

## 2020-01-06 DIAGNOSIS — Z888 Allergy status to other drugs, medicaments and biological substances status: Secondary | ICD-10-CM | POA: Insufficient documentation

## 2020-01-06 DIAGNOSIS — S52572A Other intraarticular fracture of lower end of left radius, initial encounter for closed fracture: Secondary | ICD-10-CM | POA: Diagnosis present

## 2020-01-06 DIAGNOSIS — Z87891 Personal history of nicotine dependence: Secondary | ICD-10-CM | POA: Diagnosis not present

## 2020-01-06 DIAGNOSIS — R42 Dizziness and giddiness: Secondary | ICD-10-CM | POA: Insufficient documentation

## 2020-01-06 DIAGNOSIS — Z833 Family history of diabetes mellitus: Secondary | ICD-10-CM | POA: Insufficient documentation

## 2020-01-06 DIAGNOSIS — K219 Gastro-esophageal reflux disease without esophagitis: Secondary | ICD-10-CM | POA: Insufficient documentation

## 2020-01-06 DIAGNOSIS — G4730 Sleep apnea, unspecified: Secondary | ICD-10-CM | POA: Insufficient documentation

## 2020-01-06 DIAGNOSIS — Z96653 Presence of artificial knee joint, bilateral: Secondary | ICD-10-CM | POA: Insufficient documentation

## 2020-01-06 DIAGNOSIS — Z823 Family history of stroke: Secondary | ICD-10-CM | POA: Diagnosis not present

## 2020-01-06 DIAGNOSIS — Z7984 Long term (current) use of oral hypoglycemic drugs: Secondary | ICD-10-CM | POA: Diagnosis not present

## 2020-01-06 DIAGNOSIS — M1990 Unspecified osteoarthritis, unspecified site: Secondary | ICD-10-CM | POA: Insufficient documentation

## 2020-01-06 DIAGNOSIS — Z91040 Latex allergy status: Secondary | ICD-10-CM | POA: Insufficient documentation

## 2020-01-06 DIAGNOSIS — Z955 Presence of coronary angioplasty implant and graft: Secondary | ICD-10-CM | POA: Insufficient documentation

## 2020-01-06 DIAGNOSIS — Y939 Activity, unspecified: Secondary | ICD-10-CM | POA: Insufficient documentation

## 2020-01-06 DIAGNOSIS — X58XXXA Exposure to other specified factors, initial encounter: Secondary | ICD-10-CM | POA: Diagnosis not present

## 2020-01-06 DIAGNOSIS — E669 Obesity, unspecified: Secondary | ICD-10-CM | POA: Insufficient documentation

## 2020-01-06 DIAGNOSIS — I10 Essential (primary) hypertension: Secondary | ICD-10-CM | POA: Insufficient documentation

## 2020-01-06 DIAGNOSIS — Z91048 Other nonmedicinal substance allergy status: Secondary | ICD-10-CM | POA: Diagnosis not present

## 2020-01-06 DIAGNOSIS — E785 Hyperlipidemia, unspecified: Secondary | ICD-10-CM | POA: Diagnosis not present

## 2020-01-06 DIAGNOSIS — R000 Tachycardia, unspecified: Secondary | ICD-10-CM | POA: Insufficient documentation

## 2020-01-06 DIAGNOSIS — I2510 Atherosclerotic heart disease of native coronary artery without angina pectoris: Secondary | ICD-10-CM | POA: Diagnosis not present

## 2020-01-06 DIAGNOSIS — Z6837 Body mass index (BMI) 37.0-37.9, adult: Secondary | ICD-10-CM | POA: Diagnosis not present

## 2020-01-06 DIAGNOSIS — E119 Type 2 diabetes mellitus without complications: Secondary | ICD-10-CM | POA: Insufficient documentation

## 2020-01-06 HISTORY — PX: OPEN REDUCTION INTERNAL FIXATION (ORIF) DISTAL RADIAL FRACTURE: SHX5989

## 2020-01-06 LAB — GLUCOSE, CAPILLARY
Glucose-Capillary: 117 mg/dL — ABNORMAL HIGH (ref 70–99)
Glucose-Capillary: 127 mg/dL — ABNORMAL HIGH (ref 70–99)
Glucose-Capillary: 121 mg/dL — ABNORMAL HIGH (ref 70–99)

## 2020-01-06 SURGERY — OPEN REDUCTION INTERNAL FIXATION (ORIF) DISTAL RADIUS FRACTURE
Anesthesia: Regional | Laterality: Left

## 2020-01-06 MED ORDER — ACETAMINOPHEN 10 MG/ML IV SOLN: 1000.0000 mg | Freq: Once | INTRAVENOUS | Status: DC | PRN

## 2020-01-06 MED ORDER — LACTATED RINGERS IV SOLN: INTRAVENOUS | Status: DC

## 2020-01-06 MED ORDER — DEXAMETHASONE SODIUM PHOSPHATE 10 MG/ML IJ SOLN
INTRAMUSCULAR | Status: AC
  Filled 2020-01-06: qty 1

## 2020-01-06 MED ORDER — ONDANSETRON HCL 4 MG/2ML IJ SOLN
INTRAMUSCULAR | Status: AC
  Filled 2020-01-06: qty 2

## 2020-01-06 MED ORDER — PROPOFOL 10 MG/ML IV BOLUS
INTRAVENOUS | Status: AC
  Filled 2020-01-06: qty 20

## 2020-01-06 MED ORDER — CHLORHEXIDINE GLUCONATE 4 % EX LIQD: 60.0000 mL | Freq: Once | CUTANEOUS | Status: DC

## 2020-01-06 MED ORDER — ACETAMINOPHEN 500 MG PO TABS: 1000.0000 mg | ORAL_TABLET | Freq: Once | ORAL | Status: DC

## 2020-01-06 MED ORDER — ONDANSETRON HCL 4 MG/2ML IJ SOLN: 4.0000 mg | Freq: Once | INTRAMUSCULAR | Status: DC | PRN

## 2020-01-06 MED ORDER — PHENYLEPHRINE HCL (PRESSORS) 10 MG/ML IV SOLN
INTRAVENOUS | Status: DC | PRN
  Administered 2020-01-06: 100 ug via INTRAVENOUS

## 2020-01-06 MED ORDER — PHENYLEPHRINE 40 MCG/ML (10ML) SYRINGE FOR IV PUSH (FOR BLOOD PRESSURE SUPPORT)
PREFILLED_SYRINGE | INTRAVENOUS | Status: AC
  Filled 2020-01-06: qty 10

## 2020-01-06 MED ORDER — LACTATED RINGERS IV SOLN: INTRAVENOUS | Status: DC | PRN

## 2020-01-06 MED ORDER — LIDOCAINE-EPINEPHRINE (PF) 1.5 %-1:200000 IJ SOLN
INTRAMUSCULAR | Status: DC | PRN
  Administered 2020-01-06: 30 mL via PERINEURAL

## 2020-01-06 MED ORDER — ONDANSETRON HCL 4 MG/2ML IJ SOLN
INTRAMUSCULAR | Status: DC | PRN
  Administered 2020-01-06: 4 mg via INTRAVENOUS

## 2020-01-06 MED ORDER — MIDAZOLAM HCL 2 MG/2ML IJ SOLN
INTRAMUSCULAR | Status: AC
  Filled 2020-01-06: qty 2

## 2020-01-06 MED ORDER — FENTANYL CITRATE (PF) 100 MCG/2ML IJ SOLN
INTRAMUSCULAR | Status: AC
  Administered 2020-01-06: 100 ug via INTRAVENOUS
  Filled 2020-01-06: qty 2

## 2020-01-06 MED ORDER — FENTANYL CITRATE (PF) 100 MCG/2ML IJ SOLN: 100.0000 ug | Freq: Once | INTRAMUSCULAR | Status: AC

## 2020-01-06 MED ORDER — 0.9 % SODIUM CHLORIDE (POUR BTL) OPTIME
TOPICAL | Status: DC | PRN
  Administered 2020-01-06: 1000 mL

## 2020-01-06 MED ORDER — FENTANYL CITRATE (PF) 100 MCG/2ML IJ SOLN: 25.0000 ug | INTRAMUSCULAR | Status: DC | PRN

## 2020-01-06 MED ORDER — CLONIDINE HCL (ANALGESIA) 100 MCG/ML EP SOLN
EPIDURAL | Status: DC | PRN
  Administered 2020-01-06: 100 ug

## 2020-01-06 MED ORDER — FENTANYL CITRATE (PF) 250 MCG/5ML IJ SOLN
INTRAMUSCULAR | Status: AC
  Filled 2020-01-06: qty 5

## 2020-01-06 MED ORDER — SODIUM CHLORIDE 0.9 % IV SOLN: INTRAVENOUS | Status: DC | PRN

## 2020-01-06 MED ORDER — LIDOCAINE 2% (20 MG/ML) 5 ML SYRINGE
INTRAMUSCULAR | Status: AC
  Filled 2020-01-06: qty 5

## 2020-01-06 SURGICAL SUPPLY — 67 items
BIT DRILL 2.2 SS TIBIAL (BIT) ×2 IMPLANT
BLADE CLIPPER SURG (BLADE) IMPLANT
BNDG ELASTIC 3X5.8 VLCR STR LF (GAUZE/BANDAGES/DRESSINGS) ×5 IMPLANT
BNDG ELASTIC 4X5.8 VLCR STR LF (GAUZE/BANDAGES/DRESSINGS) ×3 IMPLANT
BNDG ESMARK 4X9 LF (GAUZE/BANDAGES/DRESSINGS) ×3 IMPLANT
BNDG GAUZE ELAST 4 BULKY (GAUZE/BANDAGES/DRESSINGS) ×5 IMPLANT
CORD BIPOLAR FORCEPS 12FT (ELECTRODE) ×3 IMPLANT
COVER SURGICAL LIGHT HANDLE (MISCELLANEOUS) ×3 IMPLANT
COVER WAND RF STERILE (DRAPES) ×1 IMPLANT
CUFF TOURN SGL QUICK 18X4 (TOURNIQUET CUFF) ×3 IMPLANT
CUFF TOURN SGL QUICK 24 (TOURNIQUET CUFF)
CUFF TRNQT CYL 24X4X16.5-23 (TOURNIQUET CUFF) IMPLANT
DECANTER SPIKE VIAL GLASS SM (MISCELLANEOUS) IMPLANT
DRAIN TLS ROUND 10FR (DRAIN) IMPLANT
DRAPE OEC MINIVIEW 54X84 (DRAPES) ×2 IMPLANT
DRAPE U-SHAPE 47X51 STRL (DRAPES) ×1 IMPLANT
DRSG ADAPTIC 3X8 NADH LF (GAUZE/BANDAGES/DRESSINGS) ×3 IMPLANT
GAUZE SPONGE 4X4 12PLY STRL (GAUZE/BANDAGES/DRESSINGS) ×3 IMPLANT
GAUZE XEROFORM 5X9 LF (GAUZE/BANDAGES/DRESSINGS) ×3 IMPLANT
GLOVE BIOGEL M 8.0 STRL (GLOVE) ×3 IMPLANT
GLOVE SS BIOGEL STRL SZ 8 (GLOVE) ×1 IMPLANT
GLOVE SUPERSENSE BIOGEL SZ 8 (GLOVE) ×2
GOWN STRL REUS W/ TWL LRG LVL3 (GOWN DISPOSABLE) ×3 IMPLANT
GOWN STRL REUS W/ TWL XL LVL3 (GOWN DISPOSABLE) ×3 IMPLANT
GOWN STRL REUS W/TWL LRG LVL3 (GOWN DISPOSABLE) ×9
GOWN STRL REUS W/TWL XL LVL3 (GOWN DISPOSABLE) ×9
K-WIRE 1.6 (WIRE) ×6
K-WIRE FX5X1.6XNS BN SS (WIRE) ×2
KIT BASIN OR (CUSTOM PROCEDURE TRAY) ×3 IMPLANT
KIT TURNOVER KIT B (KITS) ×3 IMPLANT
KWIRE FX5X1.6XNS BN SS (WIRE) IMPLANT
MANIFOLD NEPTUNE II (INSTRUMENTS) ×3 IMPLANT
NEEDLE 22X1 1/2 (OR ONLY) (NEEDLE) IMPLANT
NS IRRIG 1000ML POUR BTL (IV SOLUTION) ×3 IMPLANT
PACK ORTHO EXTREMITY (CUSTOM PROCEDURE TRAY) ×3 IMPLANT
PAD ARMBOARD 7.5X6 YLW CONV (MISCELLANEOUS) ×6 IMPLANT
PAD CAST 4YDX4 CTTN HI CHSV (CAST SUPPLIES) ×3 IMPLANT
PADDING CAST COTTON 4X4 STRL (CAST SUPPLIES) ×3
PEG LOCKING SMOOTH 2.2X20 (Screw) ×4 IMPLANT
PEG LOCKING SMOOTH 2.2X22 (Screw) ×4 IMPLANT
PEG LOCKING SMOOTH 2.2X24 (Peg) ×2 IMPLANT
PILLOW ARM CARTER ADULT (MISCELLANEOUS) ×2 IMPLANT
PLATE NARROW DVR LEFT (Plate) ×2 IMPLANT
SCREW LOCK 14X2.7X 3 LD TPR (Screw) IMPLANT
SCREW LOCK 16X2.7X 3 LD TPR (Screw) IMPLANT
SCREW LOCK 20X2.7X 3 LD TPR (Screw) IMPLANT
SCREW LOCKING 2.7X13MM (Screw) ×4 IMPLANT
SCREW LOCKING 2.7X14 (Screw) ×3 IMPLANT
SCREW LOCKING 2.7X15MM (Screw) ×2 IMPLANT
SCREW LOCKING 2.7X16 (Screw) ×3 IMPLANT
SCREW LOCKING 2.7X20MM (Screw) ×3 IMPLANT
SOL PREP POV-IOD 4OZ 10% (MISCELLANEOUS) ×6 IMPLANT
SPLINT FIBERGLASS 3X12 (CAST SUPPLIES) ×2 IMPLANT
SPONGE LAP 4X18 RFD (DISPOSABLE) IMPLANT
SUT MNCRL AB 4-0 PS2 18 (SUTURE) ×3 IMPLANT
SUT PROLENE 3 0 PS 2 (SUTURE) IMPLANT
SUT PROLENE 4 0 PS 2 18 (SUTURE) IMPLANT
SUT VIC AB 3-0 FS2 27 (SUTURE) IMPLANT
SYR CONTROL 10ML LL (SYRINGE) IMPLANT
SYSTEM CHEST DRAIN TLS 7FR (DRAIN) IMPLANT
TOWEL GREEN STERILE (TOWEL DISPOSABLE) ×3 IMPLANT
TOWEL GREEN STERILE FF (TOWEL DISPOSABLE) ×1 IMPLANT
TUBE CONNECTING 12'X1/4 (SUCTIONS) ×1
TUBE CONNECTING 12X1/4 (SUCTIONS) ×2 IMPLANT
TUBE EVACUATION TLS (MISCELLANEOUS) ×3 IMPLANT
UNDERPAD 30X30 (UNDERPADS AND DIAPERS) ×3 IMPLANT
WATER STERILE IRR 1000ML POUR (IV SOLUTION) ×3 IMPLANT

## 2020-01-06 NOTE — Op Note (Signed)
Operative note January 06, 2020  Roseanne Kaufman MD  Preoperative diagnosis: Left distal radius fracture comminuted complex greater than 3 part intra-articular  Postop diagnosis: Same  Procedure: #1 left distal radius open reduction internal fixation comminuted complex distal radius fracture with DVR [employer] cross lock plate and screw construct.  This was a greater than 3 part intra-articular fracture.  #2 AP lateral and oblique x-rays performed examined and interpreted by myself  Catori Panozzo MD  Anesthesia: LMA General with preoperative block  Estimated blood loss minimal  Complications none immediate  Operative indications the patient presents for evaluation and surgical care.  Patient understands risk benefits and desires to proceed.  We have discussed with the patient all issues plans and concerns with this in mind we will proceed accordingly. We are planning surgery for your upper extremity. The risk and benefits of surgery to include risk of bleeding, infection, anesthesia,  damage to normal structures and failure of the surgery to accomplish its intended goals of relieving symptoms and restoring function have been discussed in detail. With this in mind we plan to proceed. I have specifically discussed with the patient the pre-and postoperative regime and the dos and don'ts and risk and benefits in great detail. Risk and benefits of surgery also include risk of dystrophy(CRPS), chronic nerve pain, failure of the healing process to go onto completion and other inherent risks of surgery The relavent the pathophysiology of the disease/injury process, as well as the alternatives for treatment and postoperative course of action has been discussed in great detail with the patient who desires to proceed.  We will do everything in our power to help you (the patient) restore function to the upper extremity. It is a pleasure to see this patient today.    Operative procedure: Patient was seen by myself  and anesthesia.  Appropriate anesthesia was induced and following this the patient was prepped with a Hibiclens pre-scrub followed by 10-minute surgical Betadine scrub and paint.  Once this was completed the extremity was elevated and the tourniquet was insufflated to 250 mmHg.  Timeout was observed preoperative antibiotics were given and the patient then underwent a very careful and cautious approach to the extremity with volar radial incision under 250 mm tourniquet control.  FCR tendon sheath was identified and dissected.  There were no complicating features.  Once this was completed the carpal canal contents were retracted ulnarly and the FCR was retracted radially.  We took very meticulous care of the radial artery and the carpal canal contents during the approach.  The pronator was accessed incised and lifted off of the fracture.  The fracture was then reassembled with standard orthopedic equipment and a DVR plate and screw construct from [employer] was accomplished in terms of placement and fixation of the fracture.  Adequate radial height, volar tilt and radial inclination was restored.  The distal radial ulnar joint, radiocarpal and midcarpal joints all were  stable and satisfactory.  We irrigated copiously and closed the pronator with 3-0 Vicryl followed by closure of the skin edge with Prolene.  Once again, the distal radius underwent open reduction internal fixation without complications.  The distal radial ulnar joint was stable.  The patient had no complications.  All radiographic parameters look quite well following the fixation.  Standard dressing of Adaptic Xeroform 4 x 4's gauze web roll Kerlix and a volar splint were applied.  The patient understands instructions of elevate move massage fingers notify us any problems occur and follow-up care according to  our standard protocol for a DVR plate and screw construct.  He has been a pleasure participate in the patient's care and we look  forward to spent in the patient's recovery. This was an uncomplicated open reduction internal fixation left distal radius fracture.  She has a concomitant left shoulder fracture which my partner Dr. Onnie Graham will be managing.  She is quite stable we will rehab her according to our standard postop algorithm.  All questions have been encouraged and answered. Roseanne Kaufman MD

## 2020-01-06 NOTE — Anesthesia Procedure Notes (Signed)
Anesthesia Regional Block: Supraclavicular block   Pre-Anesthetic Checklist: ,, timeout performed, Correct Patient, Correct Site, Correct Laterality, Correct Procedure, Correct Position, site marked, Risks and benefits discussed,  Surgical consent,  Pre-op evaluation,  At surgeon's request and post-op pain management  Laterality: Left  Prep: chloraprep       Needles:  Injection technique: Single-shot  Needle Type: Echogenic Stimulator Needle     Needle Length: 9cm  Needle Gauge: 21     Additional Needles:   Procedures:,,,, ultrasound used (permanent image in chart),,,,  Narrative:  Start time: 01/06/2020 1:30 PM End time: 01/06/2020 1:40 PM Injection made incrementally with aspirations every 5 mL.  Performed by: Personally  Anesthesiologist: Murvin Natal, MD  Additional Notes: Functioning IV was confirmed and monitors were applied.  A timeout was performed. Sterile prep, hand hygiene and sterile gloves were used. A 55mm 21ga Arrow echogenic stimulator needle was used. Negative aspiration and negative test dose prior to incremental administration of local anesthetic. The patient tolerated the procedure well.  Ultrasound guidance: relevent anatomy identified, needle position confirmed, local anesthetic spread visualized around nerve(s), vascular puncture avoided.  Image printed for medical record.

## 2020-01-06 NOTE — H&P (Signed)
Deborah Elliott is an 74 y.o. female.   Chief Complaint: Patient presents for left wrist reconstruction secondary to injury with fracture process. HPI: Patient presents for ORIF left radius greater than 3 part fracture with displacement.  We are planning surgery for your upper extremity. The risk and benefits of surgery to include risk of bleeding, infection, anesthesia,  damage to normal structures and failure of the surgery to accomplish its intended goals of relieving symptoms and restoring function have been discussed in detail. With this in mind we plan to proceed. I have specifically discussed with the patient the pre-and postoperative regime and the dos and don'ts and risk and benefits in great detail. Risk and benefits of surgery also include risk of dystrophy(CRPS), chronic nerve pain, failure of the healing process to go onto completion and other inherent risks of surgery The relavent the pathophysiology of the disease/injury process, as well as the alternatives for treatment and postoperative course of action has been discussed in great detail with the patient who desires to proceed.  We will do everything in our power to help you (the patient) restore function to the upper extremity. It is a pleasure to see this patient today.   Past Medical History:  Diagnosis Date  . Achilles rupture, left 04/06/2012  . Arthritis of knee   . Cancer ([place])    skin cancer  . Coronary artery disease    X3 STENTS  . Diabetes mellitus without complication ([place])   . Dyspnea   . Dysrhythmia    HX of tachycardia - controlled with med  . GERD (gastroesophageal reflux disease)   . Hx of skin cancer, basal cell   . Hyperlipidemia   . Hypertension   . Leg cramps   . Obesity   . PONV (postoperative nausea and vomiting)   . Sleep apnea    uses c-pap   . Vertigo     Past Surgical History:  Procedure Laterality Date  . APPENDECTOMY  48 YRS AGO  . COLONOSCOPY    . NM MYOCAR PERF WALL MOTION  05/27/2010    normal  . SKIN CANCER EXCISION    . STENTED CORONARY ARTERY  03/14/2010   X3 STENTS; tandem proximal and mid LAD  . TOTAL KNEE ARTHROPLASTY Right 08/29/2013   Procedure: RIGHT TOTAL KNEE ARTHROPLASTY;  Surgeon: Gearlean Alf, MD;  Location: [facility];  Service: Orthopedics;  Laterality: Right;  . TOTAL KNEE ARTHROPLASTY Left 08/16/2018   Procedure: LEFT TOTAL KNEE ARTHROPLASTY;  Surgeon: Gaynelle Arabian, MD;  Location: [facility];  Service: Orthopedics;  Laterality: Left;  46min    Family History  Problem Relation Age of Onset  . Hypertension Mother   . Diabetes Mother   . Stroke Mother   . Aneurysm Father   . Cancer Maternal Grandmother    Social History:  reports that she quit smoking about 49 years ago. She has never used smokeless tobacco. She reports that she does not drink alcohol or use drugs.  Allergies:  Allergies  Allergen Reactions  . Morphine And Related Other (See Comments)    Reaction unknown per patient  . Latex Rash    Blood blisters  . Sulfur Itching and Rash  . Tape Rash    Medications Prior to Admission  Medication Sig Dispense Refill  . methocarbamol (ROBAXIN) 500 MG tablet Take 500 mg by mouth every 6 (six) hours as needed for muscle spasms (6 to 8 hours).    Marland Kitchen oxycodone (OXY-IR) 5 MG capsule Take  5 mg by mouth every 4 (four) hours as needed.    Marland Kitchen oxycodone-acetaminophen (LYNOX) 10-300 MG tablet Take 1 tablet by mouth every 4 (four) hours as needed for pain.    Marland Kitchen acetaminophen (TYLENOL) 500 MG tablet Take 500 mg by mouth daily as needed (pain).     Marland Kitchen clopidogrel (PLAVIX) 75 MG tablet Take 1 tablet (75 mg total) by mouth daily. (Patient taking differently: Take 75 mg by mouth in the morning. ) 30 tablet 9  . Coenzyme Q10 200 MG capsule Take 1 capsule (200 mg total) by mouth daily. (Patient taking differently: Take 200 mg by mouth in the morning. ) 90 capsule 3  . esomeprazole (NEXIUM) 20 MG capsule Take 20 mg by mouth in the morning.     . fosinopril (MONOPRIL)  20 MG tablet Take 20 mg by mouth in the morning.     . furosemide (LASIX) 40 MG tablet Take 20 mg by mouth in the morning.     . meclizine (ANTIVERT) 25 MG tablet Take 25 mg by mouth 2 (two) times daily as needed for dizziness.     . metFORMIN (GLUCOPHAGE) 500 MG tablet Take 500 mg by mouth 2 (two) times daily.    . metoprolol succinate (TOPROL-XL) 50 MG 24 hr tablet Take 50 mg by mouth in the morning. Take with or immediately following a meal.    . ondansetron (ZOFRAN) 4 MG tablet Take 1 tablet (4 mg total) by mouth every 8 (eight) hours as needed for nausea or vomiting. 20 tablet 0  . potassium chloride SA (K-DUR,KLOR-CON) 20 MEQ tablet Take 20 mEq by mouth in the morning.     . rosuvastatin (CRESTOR) 40 MG tablet Take 1 tablet (40 mg total) by mouth every evening. 90 tablet 3    Results for orders placed or performed during the hospital encounter of 01/04/20 (from the past 48 hour(s))  SARS CORONAVIRUS 2 (TAT 6-24 HRS) Nasopharyngeal Nasopharyngeal Swab     Status: None   Collection Time: 01/04/20 11:54 AM   Specimen: Nasopharyngeal Swab  Result Value Ref Range   SARS Coronavirus 2 NEGATIVE NEGATIVE    Comment: (NOTE) SARS-CoV-2 target nucleic acids are NOT DETECTED. The SARS-CoV-2 RNA is generally detectable in upper and lower respiratory specimens during the acute phase of infection. Negative results do not preclude SARS-CoV-2 infection, do not rule out co-infections with other pathogens, and should not be used as the sole basis for treatment or other patient management decisions. Negative results must be combined with clinical observations, patient history, and epidemiological information. The expected result is Negative. Fact Sheet for Patients: SugarRoll.be Fact Sheet for Healthcare Providers: https://www.woods-mathews.com/ This test is not yet approved or cleared by the [place] FDA and  has been authorized for detection and/or  diagnosis of SARS-CoV-2 by FDA under an Emergency Use Authorization (EUA). This EUA will remain  in effect (meaning this test can be used) for the duration of the COVID-19 declaration under Section 56 4(b)(1) of the Act, 21 U.S.C. section 360bbb-3(b)(1), unless the authorization is terminated or revoked sooner. Performed at [facility], [place] [address], [place], [place] [address]    No results found.  Review of Systems  Respiratory: Negative.   Cardiovascular: Negative.   Gastrointestinal: Negative.     There were no vitals taken for this visit. Physical Exam  Fracture left radius.  Read in 3 part intra-articular fracture with displacement and deformity.  She also has concomitantly a glenoid fracture about  the left shoulder which she is seeing my partner for.  Were planning ORIF of her wrist today.  She is intact sensation and moves her fingers reasonably well although there is some swelling consistent with the injury process and sequelae.  The patient is alert and oriented in no acute distress. The patient complains of pain in the affected upper extremity.  The patient is noted to have a normal HEENT exam. Lung fields show equal chest expansion and no shortness of breath. Abdomen exam is nontender without distention. Lower extremity examination does not show any fracture dislocation or blood clot symptoms. Pelvis is stable and the neck and back are stable and nontender. Assessment/Plan We are planning surgery for your upper extremity. The risk and benefits of surgery to include risk of bleeding, infection, anesthesia,  damage to normal structures and failure of the surgery to accomplish its intended goals of relieving symptoms and restoring function have been discussed in detail. With this in mind we plan to proceed. I have specifically discussed with the patient the pre-and postoperative regime and the dos and don'ts and risk and benefits in great detail. Risk and benefits of  surgery also include risk of dystrophy(CRPS), chronic nerve pain, failure of the healing process to go onto completion and other inherent risks of surgery The relavent the pathophysiology of the disease/injury process, as well as the alternatives for treatment and postoperative course of action has been discussed in great detail with the patient who desires to proceed.  We will do everything in our power to help you (the patient) restore function to the upper extremity. It is a pleasure to see this patient today.   Deborah Frater III, MD 01/06/2020, 11:37 AM

## 2020-01-06 NOTE — Progress Notes (Signed)
Surgeon contacted regarding removal of cast prior to surgery today. Orders received and carried out for ortho tech to remove cast at this time.

## 2020-01-06 NOTE — Transfer of Care (Signed)
Immediate Anesthesia Transfer of Care Note  Patient: Deborah Elliott  Procedure(s) Performed: OPEN REDUCTION INTERNAL FIXATION (ORIF) DISTAL RADIAL FRACTURE AND REPAIR AS NECESSARY (Left )  Patient Location: PACU  Anesthesia Type:GA combined with regional for post-op pain  Level of Consciousness: awake and alert   Airway & Oxygen Therapy: Patient Spontanous Breathing and Patient connected to nasal cannula oxygen  Post-op Assessment: Report given to RN and Post -op Vital signs reviewed and stable  Post vital signs: Reviewed and stable  Last Vitals:  Vitals Value Taken Time  BP 119/63 01/06/20 1547  Temp    Pulse 72 01/06/20 1550  Resp 22 01/06/20 1550  SpO2 95 % 01/06/20 1550  Vitals shown include unvalidated device data.  Last Pain:  Vitals:   01/06/20 1335  TempSrc:   PainSc: 0-No pain      Patients Stated Pain Goal: 0 ([id number])  Complications: No apparent anesthesia complications

## 2020-01-06 NOTE — Progress Notes (Signed)
Orthopedic Tech Progress Note Patient Details:  Deborah Elliott 1945/12/04 [id number]  Casting Type of Cast: Short arm cast Cast Material: Fiberglass Cast Intervention: Removal  Post Interventions Patient Tolerated: Well Instructions Provided: Care of device     [place] Pink 01/06/2020, 1:02 PM

## 2020-01-06 NOTE — Anesthesia Postprocedure Evaluation (Signed)
Anesthesia Post Note  Patient: Deborah Elliott  Procedure(s) Performed: OPEN REDUCTION INTERNAL FIXATION (ORIF) DISTAL RADIAL FRACTURE AND REPAIR AS NECESSARY (Left )     Patient location during evaluation: PACU Anesthesia Type: Regional and General Level of consciousness: awake and alert Pain management: pain level controlled Vital Signs Assessment: post-procedure vital signs reviewed and stable Respiratory status: spontaneous breathing, nonlabored ventilation, respiratory function stable and patient connected to nasal cannula oxygen Cardiovascular status: blood pressure returned to baseline and stable Postop Assessment: no apparent nausea or vomiting Anesthetic complications: no    Last Vitals:  Vitals:   01/06/20 1547 01/06/20 1602  BP: 119/63 139/84  Pulse: 76 98  Resp: 10 16  Temp: 36.7 C 36.7 C  SpO2: 98% 96%    Last Pain:  Vitals:   01/06/20 1547  TempSrc:   PainSc: 0-No pain                 Fransheska Willingham P Kasim Mccorkle

## 2020-01-06 NOTE — Discharge Instructions (Signed)

## 2020-01-06 NOTE — Anesthesia Procedure Notes (Signed)
Procedure Name: LMA Insertion Date/Time: 01/06/2020 2:50 PM Performed by: Eligha Bridegroom, CRNA Pre-anesthesia Checklist: Patient identified, Emergency Drugs available, Suction available and Patient being monitored Patient Re-evaluated:Patient Re-evaluated prior to induction Oxygen Delivery Method: Circle system utilized Preoxygenation: Pre-oxygenation with 100% oxygen Induction Type: IV induction LMA: LMA inserted LMA Size: 4.0 Number of attempts: 1 Placement Confirmation: positive ETCO2 and breath sounds checked- equal and bilateral Tube secured with: Tape Dental Injury: Teeth and Oropharynx as per pre-operative assessment

## 2020-01-09 ENCOUNTER — Encounter: Payer: Self-pay | Admitting: *Deleted

## 2020-02-07 ENCOUNTER — Other Ambulatory Visit: Payer: Self-pay | Admitting: Internal Medicine

## 2020-02-08 ENCOUNTER — Other Ambulatory Visit: Payer: Self-pay | Admitting: Internal Medicine

## 2020-02-08 DIAGNOSIS — G44309 Post-traumatic headache, unspecified, not intractable: Secondary | ICD-10-CM

## 2020-02-08 DIAGNOSIS — R55 Syncope and collapse: Secondary | ICD-10-CM

## 2020-02-23 ENCOUNTER — Ambulatory Visit
Admission: RE | Admit: 2020-02-23 | Discharge: 2020-02-23 | Disposition: A | Discharge status: Home / Self Care | Payer: Medicare Other | Source: Ambulatory Visit | Attending: Internal Medicine | Admitting: Internal Medicine

## 2020-02-23 ENCOUNTER — Inpatient Hospital Stay: Admission: RE | Admit: 2020-02-23 | Payer: Medicare Other | Source: Ambulatory Visit

## 2020-02-23 DIAGNOSIS — G44309 Post-traumatic headache, unspecified, not intractable: Secondary | ICD-10-CM

## 2020-02-23 DIAGNOSIS — R55 Syncope and collapse: Secondary | ICD-10-CM

## 2020-02-23 MED ORDER — IOPAMIDOL (ISOVUE-300) INJECTION 61%
75.0000 mL | Freq: Once | INTRAVENOUS | Status: AC | PRN
  Administered 2020-02-23: 75 mL via INTRAVENOUS

## 2020-03-01 ENCOUNTER — Telehealth: Payer: Self-pay | Admitting: *Deleted

## 2020-03-01 NOTE — Telephone Encounter (Signed)
PRIMARY DR  [facility] Pre-operative Risk Assessment    Request for surgical clearance:  1. What type of surgery is being performed? LEFT  REVERSE SHOULDER ARTHOPLASTY/GENERAL  FOR GLENOID FRACTURE   2. When is this surgery scheduled? *  3. What type of clearance is required (medical clearance vs. Pharmacy clearance to hold med vs. Both)? MEDICAL  4. Are there any medications that need to be held prior to surgery and how long? PLAVIX    5. Practice name and name of physician performing surgery?  [facility] ; DR KEVIN SUPPLE  6. What is your office phone number [phone]   7.   What is your office fax number [facility]   8.   Anesthesia type (None, local, MAC, general) ?  UNKNOWN   Raiford Simmonds 03/01/2020, 12:21 PM  _________________________________________________________________   (provider comments below)

## 2020-03-01 NOTE — Telephone Encounter (Signed)
Pt has been advised she needs an appt for pre op clearance. Pt has been scheduled to see Dr. Gwenlyn Found 03/06/20 @ 10:45. I will send clearance notes to MD for upcoming appt. I will remove from the pre op call back pool.

## 2020-03-01 NOTE — Telephone Encounter (Signed)
Primary Cardiologist:Jonathan Gwenlyn Found, MD  Chart reviewed as part of pre-operative protocol coverage. Because of Sherrina R Rouch's past medical history and time since last visit, he/she will require a follow-up visit in order to better assess preoperative cardiovascular risk.  Pre-op covering staff: - Please schedule appointment and call patient to inform them. - Please contact requesting surgeon's office via preferred method (i.e, phone, fax) to inform them of need for appointment prior to surgery.  If applicable, this message will also be routed to pharmacy pool and/or primary cardiologist for input on holding anticoagulant/antiplatelet agent as requested below so that this information is available at time of patient's appointment.   Deberah Pelton, NP  03/01/2020, 1:00 PM

## 2020-03-05 ENCOUNTER — Other Ambulatory Visit (HOSPITAL_COMMUNITY): Payer: Self-pay | Admitting: Physician Assistant

## 2020-03-05 ENCOUNTER — Other Ambulatory Visit: Payer: Self-pay | Admitting: Physician Assistant

## 2020-03-05 DIAGNOSIS — R2681 Unsteadiness on feet: Secondary | ICD-10-CM

## 2020-03-06 ENCOUNTER — Other Ambulatory Visit: Payer: Self-pay

## 2020-03-06 ENCOUNTER — Encounter (HOSPITAL_COMMUNITY): Payer: Self-pay

## 2020-03-06 ENCOUNTER — Encounter: Payer: Self-pay | Admitting: Cardiovascular Disease

## 2020-03-06 ENCOUNTER — Ambulatory Visit: Payer: Medicare Other | Admitting: Cardiovascular Disease

## 2020-03-06 ENCOUNTER — Ambulatory Visit (HOSPITAL_COMMUNITY): Payer: Medicare Other

## 2020-03-06 DIAGNOSIS — I10 Essential (primary) hypertension: Secondary | ICD-10-CM

## 2020-03-06 DIAGNOSIS — I2510 Atherosclerotic heart disease of native coronary artery without angina pectoris: Secondary | ICD-10-CM

## 2020-03-06 NOTE — Progress Notes (Signed)
03/06/2020 Deborah Elliott   1945-11-19  [id number]  Primary Physician Burnard Bunting, MD Primary Cardiologist: Lorretta Harp MD [name], [place]  HPI:  Deborah Elliott is a 74 y.o.  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 I saw herin the office  08/09/2019. She is accompanied by her husband today.. She has obstructive sleep apnea on CPAP which she benefits from for which she sees Dr. Ellouise Newer. She has a history of CAD status post PCI and stenting of her LAD in 3 places by myself with Taxus drug-eluting stents back on Mar 14, 2010, after being admitted with unstable angina. She had a normal EF. She did have a 50% to 60% mid RCA lesion that was treated medically. Her other problems include hypertension and hyperlipidemia. She is otherwise asymptomatic.Dr. Reynaldo Minium follows her lipid profile closely.  Since I saw her 7 months ago she continues to do well.  She has had bilateral total knee replacements and recent left wrist surgery by Dr. Amedeo Plenty.  She needs her left shoulder operated on by Dr. Onnie Graham in the near future which we will clear her for at low risk.  She is otherwise asymptomatic denying chest pain or shortness of breath.    Current Meds  Medication Sig  . acetaminophen (TYLENOL) 500 MG tablet Take 500 mg by mouth daily as needed (pain).   . Apoaequorin (PREVAGEN PO) Take 1 tablet by mouth daily.  . clopidogrel (PLAVIX) 75 MG tablet Take 1 tablet (75 mg total) by mouth daily. (Patient taking differently: Take 75 mg by mouth in the morning. )  . Coenzyme Q10 200 MG capsule Take 1 capsule (200 mg total) by mouth daily. (Patient taking differently: Take 200 mg by mouth in the morning. )  . esomeprazole (NEXIUM) 20 MG capsule Take 20 mg by mouth in the morning.   . fosinopril (MONOPRIL) 20 MG tablet Take 20 mg by mouth in the morning.   . meclizine  (ANTIVERT) 25 MG tablet Take 25 mg by mouth 2 (two) times daily as needed for dizziness.   . metFORMIN (GLUCOPHAGE) 500 MG tablet Take 500 mg by mouth 2 (two) times daily.  . methocarbamol (ROBAXIN) 500 MG tablet Take 500 mg by mouth every 6 (six) hours as needed for muscle spasms (6 to 8 hours).  . metoprolol succinate (TOPROL-XL) 50 MG 24 hr tablet Take 50 mg by mouth in the morning. Take with or immediately following a meal.  . ondansetron (ZOFRAN) 4 MG tablet Take 1 tablet (4 mg total) by mouth every 8 (eight) hours as needed for nausea or vomiting.  . potassium chloride SA (K-DUR,KLOR-CON) 20 MEQ tablet Take 20 mEq by mouth in the morning.   . rosuvastatin (CRESTOR) 40 MG tablet Take 1 tablet (40 mg total) by mouth every evening.     Allergies  Allergen Reactions  . Morphine And Related Other (See Comments)    Reaction unknown per patient  . Latex Rash    Blood blisters  . Sulfur Itching and Rash  . Tape Rash    Social History   Socioeconomic History  . Marital status: Married    Spouse name: Not on file  . Number of children: Not on file  . Years of education: Not on file  . Highest education level: Not on file  Occupational History  . Not on file  Tobacco Use  . Smoking status: Former Smoker    Quit date: 08/23/1970    Years since quitting: 49.5  . Smokeless tobacco: Never Used  Substance and Sexual Activity  . Alcohol use: No  . Drug use: No  . Sexual activity: Not on file  Other Topics Concern  . Not on file  Social History Narrative  . Not on file   Social Determinants of Health   Financial Resource Strain:   . Difficulty of Paying Living Expenses:   Food Insecurity:   . Worried About Charity fundraiser in the Last Year:   . Arboriculturist in the Last Year:   Transportation Needs:   . Film/video editor (Medical):   Marland Kitchen Lack of Transportation (Non-Medical):   Physical Activity:   . Days of Exercise per Week:   . Minutes of Exercise per Session:     Stress:   . Feeling of Stress :   Social Connections:   . Frequency of Communication with Friends and Family:   . Frequency of Social Gatherings with Friends and Family:   . Attends Religious Services:   . Active Member of Clubs or Organizations:   . Attends Archivist Meetings:   Marland Kitchen Marital Status:   Intimate Partner Violence:   . Fear of Current or Ex-Partner:   . Emotionally Abused:   Marland Kitchen Physically Abused:   . Sexually Abused:      Review of Systems: General: negative for chills, fever, night sweats or weight changes.  Cardiovascular: negative for chest pain, dyspnea on exertion, edema, orthopnea, palpitations, paroxysmal nocturnal dyspnea or shortness of breath Dermatological: negative for rash Respiratory: negative for cough or wheezing Urologic: negative for hematuria Abdominal: negative for nausea, vomiting, diarrhea, bright red blood per rectum, melena, or hematemesis Neurologic: negative for visual changes, syncope, or dizziness All other systems reviewed and are otherwise negative except as noted above.    Blood pressure (!) 144/86, pulse 78, temperature (!) 97.3 F (36.3 C), height 5\' 6"  (1.676 m), weight 222 lb 12.8 oz (101.1 kg).  General appearance: alert and no distress Neck: no adenopathy, no carotid bruit, no JVD, supple, symmetrical, trachea midline and thyroid not enlarged, symmetric, no tenderness/mass/nodules Lungs: clear to auscultation bilaterally Heart: regular rate and rhythm, S1, S2 normal, no murmur, click, rub or gallop Extremities: extremities normal, atraumatic, no cyanosis or edema Pulses: 2+ and symmetric Skin: Skin color, texture, turgor normal. No rashes or lesions Neurologic: Alert and oriented X 3, normal strength and tone. Normal symmetric reflexes. Normal coordination and gait  EKG not performed today  ASSESSMENT AND PLAN:   Coronary artery disease History of CAD status post PCI and drug-eluting stenting of her LAD by myself  in 3 places using Taxus drug-eluting stents 03/14/2010.  She has done well since that time.  She denies chest pain or shortness of breath.  Essential hypertension History of essential hypertension blood pressure measured today at 144/86.  She is on Monopril and metoprolol.  Dyslipidemia History of dyslipidemia on statin therapy with lipid profile performed 04/25/2019 revealing total cholesterol 145, LDL of 85 and HDL 38.      Lorretta Harp MD [facility], [facility] 03/06/2020 11:04 AM

## 2020-03-06 NOTE — Assessment & Plan Note (Signed)
History of essential hypertension blood pressure measured today at 144/86.  She is on Monopril and metoprolol.

## 2020-03-06 NOTE — Patient Instructions (Signed)

## 2020-03-06 NOTE — Assessment & Plan Note (Signed)
History of dyslipidemia on statin therapy with lipid profile performed 04/25/2019 revealing total cholesterol 145, LDL of 85 and HDL 38.

## 2020-03-06 NOTE — Assessment & Plan Note (Signed)
History of CAD status post PCI and drug-eluting stenting of her LAD by myself in 3 places using Taxus drug-eluting stents 03/14/2010.  She has done well since that time.  She denies chest pain or shortness of breath.

## 2020-03-08 NOTE — Progress Notes (Signed)
PCP - Nada Libman Cardiologist - Lorie Phenix clearance 03-06-20 epic  Chest x-ray -1 view 11-24-19 epic  EKG - 12-29-19 epic Stress Test -  ECHO -  Cardiac Cath -   Sleep Study -  CPAP - yes  Fasting Blood Sugar - 118 Checks Blood Sugar __1___ times a day  Blood Thinner Instructions:Plavix  Claiborne Billings is calling Dr. Gwenlyn Found office for instruction and will  Call pt. To let her know when to stop plavix Aspirin Instructions: Last Dose:  Anesthesia review: cardiac stents x3 osa cpap  Patient denies shortness of breath, fever, cough and chest pain at PAT appointment  none   Patient verbalized understanding of instructions that were given to them at the PAT appointment. Patient was also instructed that they will need to review over the PAT instructions again at home before surgery.

## 2020-03-09 ENCOUNTER — Encounter (HOSPITAL_COMMUNITY): Payer: Self-pay

## 2020-03-09 ENCOUNTER — Encounter (HOSPITAL_COMMUNITY)
Admission: RE | Admit: 2020-03-09 | Discharge: 2020-03-09 | Disposition: A | Discharge status: Home / Self Care | Payer: Medicare Other | Source: Ambulatory Visit | Attending: Orthopedic Surgery | Admitting: Orthopedic Surgery

## 2020-03-09 ENCOUNTER — Other Ambulatory Visit: Payer: Self-pay

## 2020-03-09 DIAGNOSIS — Z01812 Encounter for preprocedural laboratory examination: Secondary | ICD-10-CM | POA: Diagnosis present

## 2020-03-09 LAB — BASIC METABOLIC PANEL
Anion gap: 6 (ref 5–15)
BUN: 11 mg/dL (ref 8–23)
CO2: 26 mmol/L (ref 22–32)
Calcium: 8.8 mg/dL — ABNORMAL LOW (ref 8.9–10.3)
Chloride: 109 mmol/L (ref 98–111)
Creatinine, Ser: 0.48 mg/dL (ref 0.44–1.00)
GFR calc Af Amer: >60 mL/min (ref >60)
GFR calc non Af Amer: >60 mL/min (ref >60)
Glucose, Bld: 163 mg/dL — ABNORMAL HIGH (ref 70–99)
Potassium: 3.9 mmol/L (ref 3.5–5.1)
Sodium: 141 mmol/L (ref 135–145)

## 2020-03-09 LAB — CBC
HCT: 41.1 % (ref 36.0–46.0)
Hemoglobin: 13.3 g/dL (ref 12.0–15.0)
MCH: 29.7 pg (ref 26.0–34.0)
MCHC: 32.4 g/dL (ref 30.0–36.0)
MCV: 91.7 fL (ref 80.0–100.0)
Platelets: 141 10*3/uL — ABNORMAL LOW (ref 150–400)
RBC: 4.48 MIL/uL (ref 3.87–5.11)
RDW: 13.5 % (ref 11.5–15.5)
WBC: 6.1 10*3/uL (ref 4.0–10.5)
nRBC: 0 % (ref 0.0–0.2)

## 2020-03-09 LAB — GLUCOSE, CAPILLARY: Glucose-Capillary: 149 mg/dL — ABNORMAL HIGH (ref 70–99)

## 2020-03-09 LAB — HEMOGLOBIN A1C
Hgb A1c MFr Bld: 6.2 % — ABNORMAL HIGH (ref 4.8–5.6)
Mean Plasma Glucose: 131.24 mg/dL

## 2020-03-09 LAB — SURGICAL PCR SCREEN
MRSA, PCR: NEGATIVE
Staphylococcus aureus: NEGATIVE

## 2020-03-09 NOTE — Patient Instructions (Addendum)
DUE TO COVID-19 ONLY ONE VISITOR IS ALLOWED TO COME WITH YOU AND STAY IN THE WAITING ROOM ONLY DURING PRE OP AND PROCEDURE DAY OF SURGERY. TWO VISITOR MAY VISIT WITH YOU AFTER SURGERY IN YOUR PRIVATE ROOM DURING VISITING HOURS ONLY!  10a-8pm  YOU NEED TO HAVE A COVID 19 TEST ON_5-24-21______ @___1 :00 pm____, THIS TEST MUST BE DONE BEFORE SURGERY, COME  [address], [place] , [address].  ([place]) ONCE YOUR COVID TEST IS COMPLETED, PLEASE BEGIN THE QUARANTINE INSTRUCTIONS AS OUTLINED IN YOUR HANDOUT.                Deborah Elliott  03/09/2020   Your procedure is scheduled on: 03-15-20   Report to [facility] Main  Entrance   Report to admitting at      0730 AM     Call this number if you have problems the morning of surgery [phone]    Remember: NO SOLID FOOD AFTER MIDNIGHT THE NIGHT PRIOR TO SURGERY. NOTHING BY MOUTH EXCEPT CLEAR LIQUIDS UNTIL  0700 am  . PLEASE FINISH  G2  DRINK PER SURGEON ORDER  WHICH NEEDS TO BE COMPLETED AT 0700 am then nothing by mouth .   BRUSH YOUR TEETH MORNING OF SURGERY AND RINSE YOUR MOUTH OUT, NO CHEWING GUM CANDY OR MINTS.     Take these medicines the morning of surgery with A SIP OF WATER: metoprolol, nexium,  DO NOT TAKE ANY DIABETIC MEDICATIONS DAY OF YOUR SURGERY                               You may not have any metal on your body including hair pins and              piercings  Do not wear jewelry, make-up, lotions, powders or perfumes, deodorant             Do not wear nail polish on your fingernails.  Do not shave  48 hours prior to surgery.               Do not bring valuables to the hospital. [place].  Contacts, dentures or bridgework may not be worn into surgery.      Patients discharged the day of surgery will not be allowed to drive home. IF YOU ARE HAVING SURGERY AND GOING HOME THE SAME DAY, YOU MUST HAVE AN ADULT TO DRIVE YOU HOME AND BE WITH YOU  FOR 24 HOURS. YOU MAY GO HOME BY TAXI OR UBER OR ORTHERWISE, BUT AN ADULT MUST ACCOMPANY YOU HOME AND STAY WITH YOU FOR 24 HOURS.  Name and phone number of your driver:  Special Instructions: N/A              Please read over the following fact sheets you were given: _____________________________________________________________________   [facility]- Preparing for Total Shoulder Arthroplasty    Before surgery, you can play an important role. Because skin is not sterile, your skin needs to be as free of germs as possible. You can reduce the number of germs on your skin by using the following products. . Benzoyl Peroxide Gel o Reduces the number of germs present on the skin o Applied twice a day to shoulder area starting two days before surgery    ==================================================================  Please follow  these instructions carefully:  BENZOYL PEROXIDE 5% GEL  Please do not use if you have an allergy to benzoyl peroxide.   If your skin becomes reddened/irritated stop using the benzoyl peroxide.  Starting two days before surgery, apply as follows: 1. Apply benzoyl peroxide in the morning and at night. Apply after taking a shower. If you are not taking a shower clean entire shoulder front, back, and side along with the armpit with a clean wet washcloth.  2. Place a quarter-sized dollop on your shoulder and rub in thoroughly, making sure to cover the front, back, and side of your shoulder, along with the armpit.   2 days before ____ AM   ____ PM              1 day before ____ AM   ____ PM                         3. Do this twice a day for two days.  (Last application is the night before surgery, AFTER using the CHG soap as described below).  4. Do NOT apply benzoyl peroxide gel on the day of surgery.           [facility] - Preparing for Surgery Before surgery, you can play an important role.  Because skin is not sterile, your skin needs to be as free of germs as  possible.  You can reduce the number of germs on your skin by washing with CHG (chlorahexidine gluconate) soap before surgery.  CHG is an antiseptic cleaner which kills germs and bonds with the skin to continue killing germs even after washing. Please DO NOT use if you have an allergy to CHG or antibacterial soaps.  If your skin becomes reddened/irritated stop using the CHG and inform your nurse when you arrive at Short Stay. Do not shave (including legs and underarms) for at least 48 hours prior to the first CHG shower.  You may shave your face/neck. Please follow these instructions carefully:  1.  Shower with CHG Soap the night before surgery and the  morning of Surgery.  2.  If you choose to wash your hair, wash your hair first as usual with your  normal  shampoo.  3.  After you shampoo, rinse your hair and body thoroughly to remove the  shampoo.                           4.  Use CHG as you would any other liquid soap.  You can apply chg directly  to the skin and wash                       Gently with a scrungie or clean washcloth.  5.  Apply the CHG Soap to your body ONLY FROM THE NECK DOWN.   Do not use on face/ open                           Wound or open sores. Avoid contact with eyes, ears mouth and genitals (private parts).                       Wash face,  Genitals (private parts) with your normal soap.             6.  Wash thoroughly, paying special attention to the  area where your surgery  will be performed.  7.  Thoroughly rinse your body with warm water from the neck down.  8.  DO NOT shower/wash with your normal soap after using and rinsing off  the CHG Soap.                9.  Pat yourself dry with a clean towel.            10.  Wear clean pajamas.            11.  Place clean sheets on your bed the night of your first shower and do not  sleep with pets. Day of Surgery : Do not apply any lotions/deodorants the morning of surgery.  Please wear clean clothes to the hospital/surgery  center.  FAILURE TO FOLLOW THESE INSTRUCTIONS MAY RESULT IN THE CANCELLATION OF YOUR SURGERY PATIENT SIGNATURE_________________________________  NURSE SIGNATURE__________________________________  _________  Deborah Elliott  An incentive spirometer is a tool that can help keep your lungs clear and active. This tool measures how well you are filling your lungs with each breath. Taking long deep breaths may help reverse or decrease the chance of developing breathing (pulmonary) problems (especially infection) following:  A long period of time when you are unable to move or be active. BEFORE THE PROCEDURE   If the spirometer includes an indicator to show your best effort, your nurse or respiratory therapist will set it to a desired goal.  If possible, sit up straight or lean slightly forward. Try not to slouch.  Hold the incentive spirometer in an upright position. INSTRUCTIONS FOR USE  1. Sit on the edge of your bed if possible, or sit up as far as you can in bed or on a chair. 2. Hold the incentive spirometer in an upright position. 3. Breathe out normally. 4. Place the mouthpiece in your mouth and seal your lips tightly around it. 5. Breathe in slowly and as deeply as possible, raising the piston or the ball toward the top of the column. 6. Hold your breath for 3-5 seconds or for as long as possible. Allow the piston or ball to fall to the bottom of the column. 7. Remove the mouthpiece from your mouth and breathe out normally. 8. Rest for a few seconds and repeat Steps 1 through 7 at least 10 times every 1-2 hours when you are awake. Take your time and take a few normal breaths between deep breaths. 9. The spirometer may include an indicator to show your best effort. Use the indicator as a goal to work toward during each repetition. 10. After each set of 10 deep breaths, practice coughing to be sure your lungs are clear. If you have an incision (the cut made at the time of  surgery), support your incision when coughing by placing a pillow or rolled up towels firmly against it. Once you are able to get out of bed, walk around indoors and cough well. You may stop using the incentive spirometer when instructed by your caregiver.  RISKS AND COMPLICATIONS  Take your time so you do not get dizzy or light-headed.  If you are in pain, you may need to take or ask for pain medication before doing incentive spirometry. It is harder to take a deep breath if you are having pain. AFTER USE  Rest and breathe slowly and easily.  It can be helpful to keep track of a log of your progress. Your caregiver can provide you with a simple  table to help with this. If you are using the spirometer at home, follow these instructions: [place] IF:   You are having difficultly using the spirometer.  You have trouble using the spirometer as often as instructed.  Your pain medication is not giving enough relief while using the spirometer.  You develop fever of 100.5 F (38.1 C) or higher. SEEK IMMEDIATE MEDICAL CARE IF:   You cough up bloody sputum that had not been present before.  You develop fever of 102 F (38.9 C) or greater.  You develop worsening pain at or near the incision site. MAKE SURE YOU:   Understand these instructions.  Will watch your condition.  Will get help right away if you are not doing well or get worse. Document Released: 02/16/2007 Document Revised: 12/29/2011 Document Reviewed: 04/19/2007 [facility] Patient Information 2014 [employer], [place].   ________________________________________________________________________ _______________________________________________________________

## 2020-03-09 NOTE — Progress Notes (Signed)
LVM with Glendale Chard pt. States she has no instructions for stopping Plavix . Blair Hailey me back and stated she has reached back out to Dr. Gwenlyn Found for plavix instruction and will call pt. And let her know what to do.

## 2020-03-12 ENCOUNTER — Other Ambulatory Visit (HOSPITAL_COMMUNITY)
Admission: RE | Admit: 2020-03-12 | Discharge: 2020-03-12 | Disposition: A | Discharge status: Home / Self Care | Payer: Medicare Other | Source: Ambulatory Visit | Attending: Orthopedic Surgery | Admitting: Orthopedic Surgery

## 2020-03-12 DIAGNOSIS — Z01812 Encounter for preprocedural laboratory examination: Secondary | ICD-10-CM | POA: Insufficient documentation

## 2020-03-12 DIAGNOSIS — Z20822 Contact with and (suspected) exposure to covid-19: Secondary | ICD-10-CM | POA: Diagnosis not present

## 2020-03-12 LAB — SARS CORONAVIRUS 2 (TAT 6-24 HRS): SARS Coronavirus 2: NEGATIVE

## 2020-03-13 NOTE — Anesthesia Preprocedure Evaluation (Addendum)
Anesthesia Evaluation  Patient identified by MRN, date of birth, ID band Patient awake    Reviewed: Allergy & Precautions, NPO status , Patient's Chart, lab work & pertinent test results  History of Anesthesia Complications (+) PONV and history of anesthetic complications  Airway Mallampati: II  TM Distance: >3 FB Neck ROM: Full    Dental no notable dental hx. (+) Teeth Intact   Pulmonary sleep apnea and Continuous Positive Airway Pressure Ventilation , former smoker,    Pulmonary exam normal breath sounds clear to auscultation       Cardiovascular hypertension, Pt. on medications and Pt. on home beta blockers + CAD  Normal cardiovascular exam+ dysrhythmias  Rhythm:Regular Rate:Normal  Stents x 3 LAD 2011 Myocardial perfusion scan -negative EKG 3/21 NSR, LVH with IVCD   Neuro/Psych negative neurological ROS  negative psych ROS   GI/Hepatic Neg liver ROS, GERD  Medicated and Controlled,  Endo/Other  diabetes, Well Controlled, Type 2Hyperlipidemia Obesity  Renal/GU negative Renal ROS  negative genitourinary   Musculoskeletal  (+) Arthritis , Osteoarthritis,  Left shoulder glenoid Fx with subluxation   Abdominal (+) + obese,   Peds  Hematology  (+) anemia ,   Anesthesia Other Findings   Reproductive/Obstetrics                            Anesthesia Physical Anesthesia Plan  ASA: III  Anesthesia Plan: General   Post-op Pain Management:  Regional for Post-op pain   Induction: Intravenous  PONV Risk Score and Plan: 4 or greater and Ondansetron, Treatment may vary due to age or medical condition, Diphenhydramine and Metaclopromide  Airway Management Planned: Oral ETT  Additional Equipment:   Intra-op Plan:   Post-operative Plan: Extubation in OR  Informed Consent: I have reviewed the patients History and Physical, chart, labs and discussed the procedure including the risks,  benefits and alternatives for the proposed anesthesia with the patient or authorized representative who has indicated his/her understanding and acceptance.     Dental advisory given  Plan Discussed with: CRNA and Surgeon  Anesthesia Plan Comments: (See PAT note 03/09/2020, Konrad Felix, PA-C)       Anesthesia Quick Evaluation

## 2020-03-13 NOTE — Progress Notes (Signed)
Anesthesia Chart Review   Case: [id number] Date/Time: 03/15/20 0945   Procedure: REVERSE SHOULDER ARTHROPLASTY (Left Shoulder) - 153min   Anesthesia type: General   Pre-op diagnosis: Left shoulder glenoid fracture with subluxation   Location: [facility]OM 06 / [facility]   Surgeons: Justice Britain, MD      DISCUSSION:74 y.o. former smoker (quit 08/23/1970) with h/o HTN, GERD, sleep apnea w/cpap, DM II, CAD (+stents 2011), left shoulder glenoid fx w/subluxation scheduled for above procedure 03/15/2020 with Dr. Justice Britain.   Pt last seen by cardiologist 03/06/2020.  Per OV note, "Since I saw her 7 months ago she continues to do well.  She has had bilateral total knee replacements and recent left wrist surgery by Dr. Amedeo Plenty.  She needs her left shoulder operated on by Dr. Onnie Graham in the near future which we will clear her for at low risk.  She is otherwise asymptomatic denying chest pain or shortness of breath."  Anticipate pt can proceed with planned procedure barring acute status change.   VS: BP (!) 146/85 (BP Location: Right Arm)   Pulse 74   Temp 36.7 C (Oral)   Resp 18   Ht 5\' 6"  (1.676 m)   Wt 100.9 kg   SpO2 95%   BMI 35.91 kg/m   PROVIDERS: Burnard Bunting, MD is PCP   Quay Burow, MD is Cardiologist  LABS: Labs reviewed: Acceptable for surgery. (all labs ordered are listed, but only abnormal results are displayed)  Labs Reviewed  GLUCOSE, CAPILLARY - Abnormal; Notable for the following components:      Result Value   Glucose-Capillary 149 (*)    All other components within normal limits     IMAGES: VAS US Carotid 12/27/2019 Summary:  Right Carotid: Velocities in the right ICA are consistent with a 1-39%  stenosis.   Left Carotid: Velocities in the left ICA are consistent with a 1-39%  stenosis.   EKG: 12/29/2019 Rate 59 bpm  Sinus or ectopic atrial rhythm  LVH with IVCD, LAD and secondary repol abnrm  CV: Myocardial Perfusion 05/27/2010 Impression -Perfusion  defect in the anterior myocardial region consistent with breast attenuation.  The remaining myocardium demonstrates normal myocardial perfusion with no evidence of ischemia or infarct.  The post stress left ventricle is normal in size.   -The post-stress ejection fraction is 62%.  Global left ventricular systolic function is normal.  No significant wall motion abnormalities noted.  -Pharmacological stress test without chest pain or EKG changes for ischemia -Normal myocardial perfusion scan demonstrating an attenuation artifact in the anterior region of the myocardium.  No ischemia or infarct/scar is seen in the remaining myocardium.  No prior study available for comparison.  Normal Myocardial Perfusion Study.  This is a low risk scan.  Past Medical History:  Diagnosis Date  . Achilles rupture, left 04/06/2012  . Arthritis of knee   . Cancer ([place])    skin cancer  . Coronary artery disease    X3 STENTS  . Diabetes mellitus without complication ([place])    type 2   . Dysrhythmia    HX of tachycardia - controlled with med  . GERD (gastroesophageal reflux disease)   . Hx of skin cancer, basal cell   . Hyperlipidemia   . Hypertension   . Leg cramps   . Obesity   . Sleep apnea    uses c-pap   . Vertigo     Past Surgical History:  Procedure Laterality Date  . APPENDECTOMY  48 YRS  AGO  . COLONOSCOPY    . NM MYOCAR PERF WALL MOTION  05/27/2010   normal  . OPEN REDUCTION INTERNAL FIXATION (ORIF) DISTAL RADIAL FRACTURE Left 01/06/2020   Procedure: OPEN REDUCTION INTERNAL FIXATION (ORIF) DISTAL RADIAL FRACTURE AND REPAIR AS NECESSARY;  Surgeon: Roseanne Kaufman, MD;  Location: [facility];  Service: Orthopedics;  Laterality: Left;  Block with IV sed 60 mins  . SKIN CANCER EXCISION    . STENTED CORONARY ARTERY  03/14/2010   X3 STENTS; tandem proximal and mid LAD  . TOTAL KNEE ARTHROPLASTY Right 08/29/2013   Procedure: RIGHT TOTAL KNEE ARTHROPLASTY;  Surgeon: Gearlean Alf, MD;  Location: [facility];   Service: Orthopedics;  Laterality: Right;  . TOTAL KNEE ARTHROPLASTY Left 08/16/2018   Procedure: LEFT TOTAL KNEE ARTHROPLASTY;  Surgeon: Gaynelle Arabian, MD;  Location: [facility];  Service: Orthopedics;  Laterality: Left;  12min    MEDICATIONS: . acetaminophen (TYLENOL) 500 MG tablet  . Apoaequorin (PREVAGEN PO)  . clopidogrel (PLAVIX) 75 MG tablet  . Coenzyme Q10 200 MG capsule  . esomeprazole (NEXIUM) 20 MG capsule  . fosinopril (MONOPRIL) 20 MG tablet  . furosemide (LASIX) 40 MG tablet  . meclizine (ANTIVERT) 25 MG tablet  . metFORMIN (GLUCOPHAGE) 500 MG tablet  . methocarbamol (ROBAXIN) 500 MG tablet  . metoprolol succinate (TOPROL-XL) 50 MG 24 hr tablet  . ondansetron (ZOFRAN) 4 MG tablet  . potassium chloride SA (K-DUR,KLOR-CON) 20 MEQ tablet  . rosuvastatin (CRESTOR) 40 MG tablet   No current facility-administered medications for this encounter.   Maia Plan [facility] Pre-Surgical Testing [phone] 03/13/20  1:54 PM

## 2020-03-15 ENCOUNTER — Other Ambulatory Visit: Payer: Self-pay

## 2020-03-15 ENCOUNTER — Encounter (HOSPITAL_COMMUNITY)
Admission: RE | Disposition: A | Discharge status: Home / Self Care | Payer: Self-pay | Source: Home / Self Care | Attending: Orthopedic Surgery

## 2020-03-15 ENCOUNTER — Encounter (HOSPITAL_COMMUNITY): Payer: Self-pay | Admitting: Orthopedic Surgery

## 2020-03-15 ENCOUNTER — Ambulatory Visit (HOSPITAL_COMMUNITY): Payer: Medicare Other | Admitting: Physician Assistant

## 2020-03-15 ENCOUNTER — Ambulatory Visit (HOSPITAL_COMMUNITY): Payer: Medicare Other | Admitting: Anesthesiology

## 2020-03-15 ENCOUNTER — Ambulatory Visit (HOSPITAL_COMMUNITY)
Admission: RE | Admit: 2020-03-15 | Discharge: 2020-03-16 | Disposition: A | Discharge status: Home / Self Care | Payer: Medicare Other | Attending: Orthopedic Surgery | Admitting: Orthopedic Surgery

## 2020-03-15 DIAGNOSIS — Z96612 Presence of left artificial shoulder joint: Secondary | ICD-10-CM

## 2020-03-15 DIAGNOSIS — X58XXXA Exposure to other specified factors, initial encounter: Secondary | ICD-10-CM | POA: Insufficient documentation

## 2020-03-15 DIAGNOSIS — Z7984 Long term (current) use of oral hypoglycemic drugs: Secondary | ICD-10-CM | POA: Diagnosis not present

## 2020-03-15 DIAGNOSIS — E669 Obesity, unspecified: Secondary | ICD-10-CM | POA: Insufficient documentation

## 2020-03-15 DIAGNOSIS — Z87891 Personal history of nicotine dependence: Secondary | ICD-10-CM | POA: Diagnosis not present

## 2020-03-15 DIAGNOSIS — S42142A Displaced fracture of glenoid cavity of scapula, left shoulder, initial encounter for closed fracture: Secondary | ICD-10-CM | POA: Diagnosis not present

## 2020-03-15 DIAGNOSIS — Z79899 Other long term (current) drug therapy: Secondary | ICD-10-CM | POA: Insufficient documentation

## 2020-03-15 DIAGNOSIS — Z6835 Body mass index (BMI) 35.0-35.9, adult: Secondary | ICD-10-CM | POA: Diagnosis not present

## 2020-03-15 DIAGNOSIS — K219 Gastro-esophageal reflux disease without esophagitis: Secondary | ICD-10-CM | POA: Diagnosis not present

## 2020-03-15 DIAGNOSIS — Z955 Presence of coronary angioplasty implant and graft: Secondary | ICD-10-CM | POA: Diagnosis not present

## 2020-03-15 DIAGNOSIS — E785 Hyperlipidemia, unspecified: Secondary | ICD-10-CM | POA: Insufficient documentation

## 2020-03-15 DIAGNOSIS — I2510 Atherosclerotic heart disease of native coronary artery without angina pectoris: Secondary | ICD-10-CM | POA: Insufficient documentation

## 2020-03-15 DIAGNOSIS — E119 Type 2 diabetes mellitus without complications: Secondary | ICD-10-CM | POA: Diagnosis not present

## 2020-03-15 DIAGNOSIS — G4730 Sleep apnea, unspecified: Secondary | ICD-10-CM | POA: Diagnosis not present

## 2020-03-15 DIAGNOSIS — Z96653 Presence of artificial knee joint, bilateral: Secondary | ICD-10-CM | POA: Insufficient documentation

## 2020-03-15 DIAGNOSIS — Z7902 Long term (current) use of antithrombotics/antiplatelets: Secondary | ICD-10-CM | POA: Insufficient documentation

## 2020-03-15 DIAGNOSIS — I10 Essential (primary) hypertension: Secondary | ICD-10-CM | POA: Insufficient documentation

## 2020-03-15 HISTORY — PX: REVERSE SHOULDER ARTHROPLASTY: SHX5054

## 2020-03-15 LAB — GLUCOSE, CAPILLARY
Glucose-Capillary: 132 mg/dL — ABNORMAL HIGH (ref 70–99)
Glucose-Capillary: 132 mg/dL — ABNORMAL HIGH (ref 70–99)
Glucose-Capillary: 136 mg/dL — ABNORMAL HIGH (ref 70–99)
Glucose-Capillary: 167 mg/dL — ABNORMAL HIGH (ref 70–99)

## 2020-03-15 SURGERY — ARTHROPLASTY, SHOULDER, TOTAL, REVERSE
Anesthesia: General | Site: Shoulder | Laterality: Left

## 2020-03-15 MED ORDER — BUPIVACAINE LIPOSOME 1.3 % IJ SUSP
INTRAMUSCULAR | Status: DC | PRN
  Administered 2020-03-15: 10 mL via PERINEURAL

## 2020-03-15 MED ORDER — DEXAMETHASONE SODIUM PHOSPHATE 10 MG/ML IJ SOLN
INTRAMUSCULAR | Status: AC
  Filled 2020-03-15: qty 1

## 2020-03-15 MED ORDER — PROPOFOL 10 MG/ML IV BOLUS
INTRAVENOUS | Status: AC
  Filled 2020-03-15: qty 20

## 2020-03-15 MED ORDER — ORAL CARE MOUTH RINSE: 15.0000 mL | Freq: Once | OROMUCOSAL | Status: AC

## 2020-03-15 MED ORDER — ONDANSETRON HCL 4 MG/2ML IJ SOLN: 4.0000 mg | Freq: Four times a day (QID) | INTRAMUSCULAR | Status: DC | PRN

## 2020-03-15 MED ORDER — LIDOCAINE 2% (20 MG/ML) 5 ML SYRINGE
INTRAMUSCULAR | Status: DC | PRN
  Administered 2020-03-15: 50 mg via INTRAVENOUS

## 2020-03-15 MED ORDER — FENTANYL CITRATE (PF) 100 MCG/2ML IJ SOLN: 25.0000 ug | INTRAMUSCULAR | Status: DC | PRN

## 2020-03-15 MED ORDER — OXYCODONE HCL 5 MG PO TABS
5.0000 mg | ORAL_TABLET | ORAL | Status: DC | PRN
  Filled 2020-03-15: qty 1

## 2020-03-15 MED ORDER — EPHEDRINE 5 MG/ML INJ
INTRAVENOUS | Status: AC
  Filled 2020-03-15: qty 10

## 2020-03-15 MED ORDER — ONDANSETRON HCL 4 MG/2ML IJ SOLN: 4.0000 mg | Freq: Once | INTRAMUSCULAR | Status: DC | PRN

## 2020-03-15 MED ORDER — ALBUTEROL SULFATE (2.5 MG/3ML) 0.083% IN NEBU: 2.5000 mg | INHALATION_SOLUTION | Freq: Four times a day (QID) | RESPIRATORY_TRACT | Status: DC | PRN

## 2020-03-15 MED ORDER — MIDAZOLAM HCL 2 MG/2ML IJ SOLN: 1.0000 mg | INTRAMUSCULAR | Status: DC

## 2020-03-15 MED ORDER — METOPROLOL SUCCINATE ER 50 MG PO TB24
50.0000 mg | ORAL_TABLET | Freq: Every day | ORAL | Status: DC
  Administered 2020-03-16: 50 mg via ORAL
  Filled 2020-03-15: qty 1

## 2020-03-15 MED ORDER — STERILE WATER FOR IRRIGATION IR SOLN
Status: DC | PRN
  Administered 2020-03-15: 2000 mL

## 2020-03-15 MED ORDER — BUPIVACAINE-EPINEPHRINE (PF) 0.5% -1:200000 IJ SOLN
INTRAMUSCULAR | Status: DC | PRN
  Administered 2020-03-15: 20 mL via PERINEURAL

## 2020-03-15 MED ORDER — FENTANYL CITRATE (PF) 100 MCG/2ML IJ SOLN: 50.0000 ug | Freq: Once | INTRAMUSCULAR | Status: AC

## 2020-03-15 MED ORDER — TRANEXAMIC ACID-NACL 1000-0.7 MG/100ML-% IV SOLN
1000.0000 mg | INTRAVENOUS | Status: AC
  Administered 2020-03-15: 1000 mg via INTRAVENOUS

## 2020-03-15 MED ORDER — LACTATED RINGERS IV SOLN: INTRAVENOUS | Status: DC

## 2020-03-15 MED ORDER — 0.9 % SODIUM CHLORIDE (POUR BTL) OPTIME
TOPICAL | Status: DC | PRN
  Administered 2020-03-15: 1000 mL

## 2020-03-15 MED ORDER — PANTOPRAZOLE SODIUM 40 MG PO TBEC
40.0000 mg | DELAYED_RELEASE_TABLET | Freq: Every day | ORAL | Status: DC
  Administered 2020-03-15: 40 mg via ORAL
  Filled 2020-03-15: qty 1

## 2020-03-15 MED ORDER — INSULIN ASPART 100 UNIT/ML ~~LOC~~ SOLN
0.0000 [IU] | Freq: Three times a day (TID) | SUBCUTANEOUS | Status: DC
  Administered 2020-03-15: 2 [IU] via SUBCUTANEOUS
  Administered 2020-03-16: 3 [IU] via SUBCUTANEOUS

## 2020-03-15 MED ORDER — FUROSEMIDE 20 MG PO TABS: 20.0000 mg | ORAL_TABLET | Freq: Every day | ORAL | Status: DC

## 2020-03-15 MED ORDER — OXYCODONE HCL 5 MG PO TABS
10.0000 mg | ORAL_TABLET | ORAL | Status: DC | PRN
  Administered 2020-03-16: 10 mg via ORAL
  Filled 2020-03-15: qty 2

## 2020-03-15 MED ORDER — ACETAMINOPHEN 325 MG PO TABS
325.0000 mg | ORAL_TABLET | Freq: Four times a day (QID) | ORAL | Status: DC | PRN
  Administered 2020-03-16: 650 mg via ORAL
  Filled 2020-03-15: qty 2

## 2020-03-15 MED ORDER — CHLORHEXIDINE GLUCONATE 0.12 % MT SOLN
15.0000 mL | Freq: Once | OROMUCOSAL | Status: AC
  Administered 2020-03-15: 15 mL via OROMUCOSAL

## 2020-03-15 MED ORDER — FENTANYL CITRATE (PF) 100 MCG/2ML IJ SOLN
INTRAMUSCULAR | Status: AC
  Filled 2020-03-15: qty 2

## 2020-03-15 MED ORDER — ONDANSETRON HCL 4 MG/2ML IJ SOLN
INTRAMUSCULAR | Status: AC
  Filled 2020-03-15: qty 2

## 2020-03-15 MED ORDER — MAGNESIUM CITRATE PO SOLN: 1.0000 | Freq: Once | ORAL | Status: DC | PRN

## 2020-03-15 MED ORDER — METOCLOPRAMIDE HCL 5 MG PO TABS: 5.0000 mg | ORAL_TABLET | Freq: Three times a day (TID) | ORAL | Status: DC | PRN

## 2020-03-15 MED ORDER — MENTHOL 3 MG MT LOZG: 1.0000 | LOZENGE | OROMUCOSAL | Status: DC | PRN

## 2020-03-15 MED ORDER — POLYETHYLENE GLYCOL 3350 17 G PO PACK: 17.0000 g | PACK | Freq: Every day | ORAL | Status: DC | PRN

## 2020-03-15 MED ORDER — CEFAZOLIN SODIUM-DEXTROSE 2-4 GM/100ML-% IV SOLN
INTRAVENOUS | Status: AC
  Filled 2020-03-15: qty 100

## 2020-03-15 MED ORDER — BISACODYL 5 MG PO TBEC: 5.0000 mg | DELAYED_RELEASE_TABLET | Freq: Every day | ORAL | Status: DC | PRN

## 2020-03-15 MED ORDER — DIPHENHYDRAMINE HCL 12.5 MG/5ML PO ELIX
12.5000 mg | ORAL_SOLUTION | ORAL | Status: DC | PRN
  Administered 2020-03-16: 25 mg via ORAL
  Filled 2020-03-15: qty 10

## 2020-03-15 MED ORDER — LABETALOL HCL 5 MG/ML IV SOLN
INTRAVENOUS | Status: DC | PRN
  Administered 2020-03-15: 5 mg via INTRAVENOUS

## 2020-03-15 MED ORDER — CEFAZOLIN SODIUM-DEXTROSE 2-4 GM/100ML-% IV SOLN
2.0000 g | INTRAVENOUS | Status: AC
  Administered 2020-03-15: 2 g via INTRAVENOUS

## 2020-03-15 MED ORDER — HYDROMORPHONE HCL 1 MG/ML IJ SOLN
0.5000 mg | INTRAMUSCULAR | Status: DC | PRN
  Administered 2020-03-16: 1 mg via INTRAVENOUS
  Filled 2020-03-15: qty 1

## 2020-03-15 MED ORDER — FENTANYL CITRATE (PF) 250 MCG/5ML IJ SOLN
INTRAMUSCULAR | Status: DC | PRN
  Administered 2020-03-15: 100 ug via INTRAVENOUS

## 2020-03-15 MED ORDER — ALUM & MAG HYDROXIDE-SIMETH 200-200-20 MG/5ML PO SUSP: 30.0000 mL | ORAL | Status: DC | PRN

## 2020-03-15 MED ORDER — FOSINOPRIL SODIUM 20 MG PO TABS
20.0000 mg | ORAL_TABLET | Freq: Every day | ORAL | Status: DC
  Filled 2020-03-15: qty 1

## 2020-03-15 MED ORDER — FENTANYL CITRATE (PF) 100 MCG/2ML IJ SOLN
INTRAMUSCULAR | Status: AC
  Administered 2020-03-15: 50 ug via INTRAVENOUS
  Filled 2020-03-15: qty 2

## 2020-03-15 MED ORDER — ROCURONIUM BROMIDE 10 MG/ML (PF) SYRINGE
PREFILLED_SYRINGE | INTRAVENOUS | Status: DC | PRN
  Administered 2020-03-15: 50 mg via INTRAVENOUS

## 2020-03-15 MED ORDER — MIDAZOLAM HCL 2 MG/2ML IJ SOLN
INTRAMUSCULAR | Status: AC
  Administered 2020-03-15: 1 mg via INTRAVENOUS
  Filled 2020-03-15: qty 2

## 2020-03-15 MED ORDER — METHOCARBAMOL 500 MG PO TABS
500.0000 mg | ORAL_TABLET | Freq: Four times a day (QID) | ORAL | Status: DC | PRN
  Administered 2020-03-16: 500 mg via ORAL
  Filled 2020-03-15: qty 1

## 2020-03-15 MED ORDER — PROPOFOL 10 MG/ML IV BOLUS
INTRAVENOUS | Status: DC | PRN
  Administered 2020-03-15: 50 mg via INTRAVENOUS
  Administered 2020-03-15: 200 mg via INTRAVENOUS

## 2020-03-15 MED ORDER — PHENOL 1.4 % MT LIQD: 1.0000 | OROMUCOSAL | Status: DC | PRN

## 2020-03-15 MED ORDER — CLOPIDOGREL BISULFATE 75 MG PO TABS
75.0000 mg | ORAL_TABLET | Freq: Every day | ORAL | Status: DC
  Administered 2020-03-16: 75 mg via ORAL
  Filled 2020-03-15: qty 1

## 2020-03-15 MED ORDER — PHENYLEPHRINE HCL-NACL 10-0.9 MG/250ML-% IV SOLN
INTRAVENOUS | Status: DC | PRN
Start: 2020-03-15 — End: 2020-03-15
  Administered 2020-03-15: 50 ug/min via INTRAVENOUS
  Administered 2020-03-15: 25 ug/min via INTRAVENOUS

## 2020-03-15 MED ORDER — ONDANSETRON HCL 4 MG PO TABS: 4.0000 mg | ORAL_TABLET | Freq: Four times a day (QID) | ORAL | Status: DC | PRN

## 2020-03-15 MED ORDER — ROCURONIUM BROMIDE 10 MG/ML (PF) SYRINGE
PREFILLED_SYRINGE | INTRAVENOUS | Status: AC
  Filled 2020-03-15: qty 10

## 2020-03-15 MED ORDER — PHENYLEPHRINE HCL (PRESSORS) 10 MG/ML IV SOLN
INTRAVENOUS | Status: AC
  Filled 2020-03-15: qty 1

## 2020-03-15 MED ORDER — MECLIZINE HCL 25 MG PO TABS: 25.0000 mg | ORAL_TABLET | Freq: Two times a day (BID) | ORAL | Status: DC | PRN

## 2020-03-15 MED ORDER — METHOCARBAMOL 500 MG IVPB - SIMPLE MED
500.0000 mg | Freq: Four times a day (QID) | INTRAVENOUS | Status: DC | PRN
  Filled 2020-03-15: qty 50

## 2020-03-15 MED ORDER — TRANEXAMIC ACID-NACL 1000-0.7 MG/100ML-% IV SOLN
INTRAVENOUS | Status: AC
  Filled 2020-03-15: qty 100

## 2020-03-15 MED ORDER — DEXAMETHASONE SODIUM PHOSPHATE 10 MG/ML IJ SOLN
INTRAMUSCULAR | Status: DC | PRN
  Administered 2020-03-15: 4 mg via INTRAVENOUS

## 2020-03-15 MED ORDER — DOCUSATE SODIUM 100 MG PO CAPS
100.0000 mg | ORAL_CAPSULE | Freq: Two times a day (BID) | ORAL | Status: DC
  Filled 2020-03-15: qty 1

## 2020-03-15 MED ORDER — ONDANSETRON HCL 4 MG/2ML IJ SOLN
INTRAMUSCULAR | Status: DC | PRN
  Administered 2020-03-15: 4 mg via INTRAVENOUS

## 2020-03-15 MED ORDER — METOCLOPRAMIDE HCL 5 MG/ML IJ SOLN: 5.0000 mg | Freq: Three times a day (TID) | INTRAMUSCULAR | Status: DC | PRN

## 2020-03-15 SURGICAL SUPPLY — 67 items
BAG ZIPLOCK 12X15 (MISCELLANEOUS) ×3 IMPLANT
BLADE SAW SGTL 83.5X18.5 (BLADE) ×3 IMPLANT
COOLER ICEMAN CLASSIC (MISCELLANEOUS) ×3 IMPLANT
COVER BACK TABLE 60X90IN (DRAPES) ×3 IMPLANT
COVER SURGICAL LIGHT HANDLE (MISCELLANEOUS) ×3 IMPLANT
COVER WAND RF STERILE (DRAPES) ×3 IMPLANT
CUP SUT UNIV REVERS 36 NEUTRAL (Cup) ×3 IMPLANT
DERMABOND ADVANCED (GAUZE/BANDAGES/DRESSINGS) ×2
DERMABOND ADVANCED .7 DNX12 (GAUZE/BANDAGES/DRESSINGS) ×1 IMPLANT
DRAPE INCISE IOBAN 66X45 STRL (DRAPES) IMPLANT
DRAPE ORTHO SPLIT 77X108 STRL (DRAPES) ×6
DRAPE SHEET LG 3/4 BI-LAMINATE (DRAPES) ×3 IMPLANT
DRAPE SURG 17X11 SM STRL (DRAPES) ×3 IMPLANT
DRAPE SURG ORHT 6 SPLT 77X108 (DRAPES) ×2 IMPLANT
DRAPE U-SHAPE 47X51 STRL (DRAPES) ×3 IMPLANT
DRESSING AQUACEL AG SP 3.5X10 (GAUZE/BANDAGES/DRESSINGS) ×1 IMPLANT
DRSG AQUACEL AG ADV 3.5X10 (GAUZE/BANDAGES/DRESSINGS) ×3 IMPLANT
DRSG AQUACEL AG SP 3.5X10 (GAUZE/BANDAGES/DRESSINGS) ×3
DURAPREP 26ML APPLICATOR (WOUND CARE) ×3 IMPLANT
ELECT BLADE TIP CTD 4 INCH (ELECTRODE) ×3 IMPLANT
ELECT REM PT RETURN 15FT ADLT (MISCELLANEOUS) ×3 IMPLANT
FACESHIELD WRAPAROUND (MASK) ×12 IMPLANT
GLENOID UNI REV MOD 24 +2 LAT (Joint) ×3 IMPLANT
GLENOSPHERE 36 +4 LAT/24 (Joint) ×3 IMPLANT
GLOVE BIO SURGEON STRL SZ7.5 (GLOVE) ×3 IMPLANT
GLOVE BIO SURGEON STRL SZ8 (GLOVE) ×3 IMPLANT
GLOVE SS BIOGEL STRL SZ 7 (GLOVE) ×1 IMPLANT
GLOVE SS BIOGEL STRL SZ 7.5 (GLOVE) ×1 IMPLANT
GLOVE SUPERSENSE BIOGEL SZ 7 (GLOVE) ×2
GLOVE SUPERSENSE BIOGEL SZ 7.5 (GLOVE) ×2
GLOVE SURG SYN 7.0 (GLOVE) IMPLANT
GLOVE SURG SYN 7.5  E (GLOVE)
GLOVE SURG SYN 7.5 E (GLOVE) IMPLANT
GLOVE SURG SYN 8.0 (GLOVE) IMPLANT
GOWN STRL REUS W/TWL LRG LVL3 (GOWN DISPOSABLE) ×6 IMPLANT
INSERT HUMERAL 36 +6 (Shoulder) ×3 IMPLANT
KIT BASIN (CUSTOM PROCEDURE TRAY) ×3 IMPLANT
KIT TURNOVER KIT A (KITS) IMPLANT
MANIFOLD NEPTUNE II (INSTRUMENTS) ×3 IMPLANT
NEEDLE TAPERED W/ NITINOL LOOP (MISCELLANEOUS) ×3 IMPLANT
NS IRRIG 1000ML POUR BTL (IV SOLUTION) ×3 IMPLANT
PACK SHOULDER (CUSTOM PROCEDURE TRAY) ×3 IMPLANT
PAD ARMBOARD 7.5X6 YLW CONV (MISCELLANEOUS) ×3 IMPLANT
PAD COLD SHLDR WRAP-ON (PAD) ×3 IMPLANT
PIN SET MODULAR GLENOID SYSTEM (PIN) ×3 IMPLANT
RESTRAINT HEAD UNIVERSAL NS (MISCELLANEOUS) ×3 IMPLANT
SCREW CENTRAL MODULAR 25 (Screw) ×3 IMPLANT
SCREW PERI LOCK 5.5X32 (Screw) ×3 IMPLANT
SCREW PERIPHERAL 5.5X20 LOCK (Screw) ×3 IMPLANT
SCREW PERIPHERAL 5.5X28 LOCK (Screw) ×3 IMPLANT
SLING ARM FOAM STRAP LRG (SOFTGOODS) IMPLANT
SLING ARM FOAM STRAP MED (SOFTGOODS) ×3 IMPLANT
SPONGE LAP 18X18 RF (DISPOSABLE) IMPLANT
STEM HUMERAL UNI REVERS SZ6 (Stem) ×3 IMPLANT
SUCTION FRAZIER HANDLE 12FR (TUBING) ×3
SUCTION TUBE FRAZIER 12FR DISP (TUBING) ×1 IMPLANT
SUT FIBERWIRE #2 38 T-5 BLUE (SUTURE)
SUT MNCRL AB 3-0 PS2 18 (SUTURE) ×3 IMPLANT
SUT MON AB 2-0 CT1 36 (SUTURE) ×3 IMPLANT
SUT VIC AB 1 CT1 36 (SUTURE) ×3 IMPLANT
SUTURE FIBERWR #2 38 T-5 BLUE (SUTURE) IMPLANT
SUTURE TAPE 1.3 40 TPR END (SUTURE) ×2 IMPLANT
SUTURETAPE 1.3 40 TPR END (SUTURE) ×6
TOWEL OR 17X26 10 PK STRL BLUE (TOWEL DISPOSABLE) ×3 IMPLANT
TOWEL OR NON WOVEN STRL DISP B (DISPOSABLE) ×3 IMPLANT
WATER STERILE IRR 1000ML POUR (IV SOLUTION) ×6 IMPLANT
YANKAUER SUCT BULB TIP 10FT TU (MISCELLANEOUS) ×3 IMPLANT

## 2020-03-15 NOTE — H&P (Signed)
Deborah Elliott    Chief Complaint: Left shoulder glenoid fracture with subluxation HPI: The patient is a 74 y.o. female status post left shoulder fracture dislocation with persistent displacement of a glenoid rim fracture fragment.  She has had ongoing subluxation episodes.  Due to her ongoing significant pain and functional mentation she is brought to the operating room at this time for planned left shoulder reverse arthroplasty.  Past Medical History:  Diagnosis Date  . Achilles rupture, left 04/06/2012  . Arthritis of knee   . Cancer ([place])    skin cancer  . Coronary artery disease    X3 STENTS  . Diabetes mellitus without complication ([place])    type 2   . Dysrhythmia    HX of tachycardia - controlled with med  . GERD (gastroesophageal reflux disease)   . Hx of skin cancer, basal cell   . Hyperlipidemia   . Hypertension   . Leg cramps   . Obesity   . Sleep apnea    uses c-pap   . Vertigo     Past Surgical History:  Procedure Laterality Date  . APPENDECTOMY  48 YRS AGO  . COLONOSCOPY    . NM MYOCAR PERF WALL MOTION  05/27/2010   normal  . OPEN REDUCTION INTERNAL FIXATION (ORIF) DISTAL RADIAL FRACTURE Left 01/06/2020   Procedure: OPEN REDUCTION INTERNAL FIXATION (ORIF) DISTAL RADIAL FRACTURE AND REPAIR AS NECESSARY;  Surgeon: Roseanne Kaufman, MD;  Location: [facility];  Service: Orthopedics;  Laterality: Left;  Block with IV sed 60 mins  . SKIN CANCER EXCISION    . STENTED CORONARY ARTERY  03/14/2010   X3 STENTS; tandem proximal and mid LAD  . TOTAL KNEE ARTHROPLASTY Right 08/29/2013   Procedure: RIGHT TOTAL KNEE ARTHROPLASTY;  Surgeon: Gearlean Alf, MD;  Location[facility];  Service: Orthopedics;  Laterality: Right;  . TOTAL KNEE ARTHROPLASTY Left 08/16/2018   Procedure: LEFT TOTAL KNEE ARTHROPLASTY;  Surgeon: Gaynelle Arabian, MD;  Location[facility];  Service: Orthopedics;  Laterality: Left;  45min    Family History  Problem Relation Age of Onset  . Hypertension Mother   .  Diabetes Mother   . Stroke Mother   . Aneurysm Father   . Cancer Maternal Grandmother     Social History:  reports that she quit smoking about 49 years ago. She has never used smokeless tobacco. She reports that she does not drink alcohol or use drugs.   Medications Prior to Admission  Medication Sig Dispense Refill  . acetaminophen (TYLENOL) 500 MG tablet Take 500 mg by mouth daily as needed (pain).     . Apoaequorin (PREVAGEN PO) Take 1 tablet by mouth daily.    . clopidogrel (PLAVIX) 75 MG tablet Take 1 tablet (75 mg total) by mouth daily. (Patient taking differently: Take 75 mg by mouth in the morning. ) 30 tablet 9  . Coenzyme Q10 200 MG capsule Take 1 capsule (200 mg total) by mouth daily. (Patient taking differently: Take 200 mg by mouth in the morning. ) 90 capsule 3  . esomeprazole (NEXIUM) 20 MG capsule Take 20 mg by mouth in the morning.     . fosinopril (MONOPRIL) 20 MG tablet Take 20 mg by mouth in the morning.     . furosemide (LASIX) 40 MG tablet Take 20 mg by mouth in the morning.     . meclizine (ANTIVERT) 25 MG tablet Take 25 mg by mouth 2 (two) times daily as needed for dizziness.     Marland Kitchen  metFORMIN (GLUCOPHAGE) 500 MG tablet Take 500 mg by mouth 2 (two) times daily.    . methocarbamol (ROBAXIN) 500 MG tablet Take 500 mg by mouth every 6 (six) hours as needed for muscle spasms (6 to 8 hours).    . metoprolol succinate (TOPROL-XL) 50 MG 24 hr tablet Take 50 mg by mouth in the morning. Take with or immediately following a meal.    . potassium chloride SA (K-DUR,KLOR-CON) 20 MEQ tablet Take 20 mEq by mouth in the morning.     . rosuvastatin (CRESTOR) 40 MG tablet Take 1 tablet (40 mg total) by mouth every evening. 90 tablet 3  . ondansetron (ZOFRAN) 4 MG tablet Take 1 tablet (4 mg total) by mouth every 8 (eight) hours as needed for nausea or vomiting. 20 tablet 0     Physical Exam: Left shoulder demonstrates painful and guarded motion as noted at her recent office visits.   She remains grossly neurovascularly intact.  Plain radiographs as well as CT scan confirm a significantly displaced fracture of the anterior inferior margin of the glenoid creating instability and persistent anterior inferior subluxation.  Vitals  Temp:  [98.9 F (37.2 C)] 98.9 F (37.2 C) (05/27 0809) Pulse Rate:  [81-89] 88 (05/27 0900) Resp:  [15-32] 19 (05/27 0900) BP: (162-187)/(90-99) 181/92 (05/27 0856) SpO2:  [94 %-100 %] 97 % (05/27 0900)  Assessment/Plan  Impression: Left shoulder glenoid fracture with subluxation  Plan of Action: Procedure(s): REVERSE SHOULDER ARTHROPLASTY  Deborah Elliott Deborah Elliott 03/15/2020, 9:34 AM Contact # [phone]

## 2020-03-15 NOTE — Anesthesia Postprocedure Evaluation (Signed)
Anesthesia Post Note  Patient: Deborah Elliott  Procedure(s) Performed: REVERSE SHOULDER ARTHROPLASTY (Left Shoulder)     Patient location during evaluation: PACU Anesthesia Type: General Level of consciousness: awake and alert and oriented Pain management: pain level controlled Vital Signs Assessment: post-procedure vital signs reviewed and stable Respiratory status: spontaneous breathing, nonlabored ventilation and respiratory function stable Cardiovascular status: blood pressure returned to baseline and stable Postop Assessment: no apparent nausea or vomiting Anesthetic complications: no    Last Vitals:  Vitals:   03/15/20 0900 03/15/20 1400  BP:  (!) 125/96  Pulse: 88 93  Resp: 19 (!) 26  Temp:    SpO2: 97% 93%    Last Pain:  Vitals:   03/15/20 1400  TempSrc:   PainSc: 0-No pain                 Demarion Pondexter A.

## 2020-03-15 NOTE — Op Note (Signed)
03/15/2020  11:46 AM  PATIENT:   Deborah Elliott  74 y.o. female  PRE-OPERATIVE DIAGNOSIS:  Left shoulder glenoid fracture with subluxation  POST-OPERATIVE DIAGNOSIS: Same  PROCEDURE: Left shoulder reverse arthroplasty utilizing a press-fit size 6 Arthrex stem with a +6 poly-, 36/+4 glenosphere on a small/+2 baseplate  SURGEON:  Lakeena Downie, Metta Clines M.D.  ASSISTANTS: Jenetta Loges, PA-C  ANESTHESIA:   General endotracheal and interscalene block with Exparel  EBL: 150 cc  SPECIMEN: None  Drains: None   PATIENT DISPOSITION:  PACU - hemodynamically stable.    PLAN OF CARE: Admit for overnight observation  Brief history:  Deborah Elliott is a 74 year old female who is status post a fall injuring the left upper extremity including a complex distal radius fracture which required open reduction and internal fixation.  In addition she had a fracture dislocation of her left shoulder which involved a significant bony defect of the anterior inferior third of the glenoid which is left residual significant subluxation of the humeral head.  Due to increasing pain and functional imitations Deborah Elliott is brought to the operating this time for planned left shoulder reverse arthroplasty.  Preoperatively, I counseled the patient regarding treatment options and risks versus benefits thereof.  Possible surgical complications were all reviewed including potential for bleeding, infection, neurovascular injury, persistent pain, loss of motion, anesthetic complication, failure of the implant, and possible need for additional surgery. They understand and accept and agrees with our planned procedure.   Procedure in detail:  After undergoing routine preop evaluation patient received prophylactic antibiotics and interscalene block with Exparel was established in the holding area by the anesthesia department.  Placed supine on the operating table and underwent the smooth induction of a general endotracheal  anesthesia.  Placed into the beachchair position and appropriately padded and protected.  The left shoulder girdle region was sterilely prepped and draped in standard fashion.  Timeout was called.  An anterior deltopectoral approach left shoulder is made through a 10 Demeter incision.  Skin flaps were elevated dissection carried deeply and the deltopectoral interval was developed from proximal to distal with the vein taken laterally in the upper centimeter and a half of the upper pectoralis major tendon was tenotomized for exposure.  Conjoined tendon mobilized and retracted medially.  The long head biceps tendon was then tenodesed at the upper border the pectoralis major tendon and the proximal segment was unroofed and excised.  Rotator cuff was then split along the rotator interval towards the base of the coracoid and the subscapularis was separated from the lesser tuberosity with electrocautery and the free margin was tagged with a pair of FiberWire sutures.  The capsular attachments on the anterior and inferior margins of the humeral neck were then divided in a subperiosteal fashion and the humeral head was then delivered through the wound.  We outlined our proposed humeral head resection using the extra medullary guide and humeral head was then removed with an oscillating saw and a metal cap was placed over the cut proximal humeral surface.  We then turned our attention to the glenoid which was exposed with the appropriate retractors gaining appropriate visualization.  A circumferential labral resection was then performed gaining complete visualization of the periphery of the glenoid and we did indeed find that the bony defect on the glenoid had allowed chronic subluxation of the humeral head such that the anterior half of the glenoid was significantly impacted with a bone fragment markedly displaced and severe erosive changes to the residual  cartilage.  A guidepin was then placed into the center of the glenoid  in a neutral position and the glenoid was then prepared with our central followed by the peripheral reamer and all debris was carefully removed.  The glenoid was terminally prepared with the central drill and tap and our baseplate was assembled with a 25 mm lag screw and this was then inserted with excellent fit and fixation.  All the peripheral locking screws except for the anterior screws were then placed and all screws obtained good purchase.  A 36/+4 glenosphere was impacted onto the baseplate and the central locking screw was placed.  We then returned attention back to the proximal humerus where the canal was opened with hand reaming and we ultimately broached up to size 6 with good fixation.  A neutral reaming post was then used to repair on the metaphysis.  A trial reduction was then performed showing good soft tissue balance.  Our final implant was then assembled on the back table and this was then impacted with excellent fit and fixation.  A series of trial reductions was then completed and we felt that the +6 polygave [place] the best soft tissue balance with good motion and good stability.  Our final +6 polywas then impacted and final reduction was then performed after the joint was copiously irrigated.  Final reduction showed good motion good stability good soft tissue balance.  The joint was then again irrigated.  Mobility of the subscap was confirmed and the subscap was then repaired back to the collar of her implant using the previously placed FiberWire sutures.  The arm easily achieved 45 degrees of external rotation.  The deltopectoral interval was then repaired with a series of figure-of-eight #1 Vicryl sutures.  2-0 Vicryl used for subcu layer and intracuticular 3-0 Monocryl for the skin followed by Dermabond and Aquacel dressing.  Left arm placed in a sling and the patient was awakened, extubated, and taken to recovery in stable condition.  Jenetta Loges, PA-C was utilized as an Environmental consultant throughout  this case, essential for help with positioning the patient, positioning extremity, tissue manipulation, implantation of the prosthesis, suture management, wound closure, and intraoperative decision-making.  Marin Shutter MD   Contact # [phone]

## 2020-03-15 NOTE — Anesthesia Procedure Notes (Signed)
Anesthesia Regional Block: Interscalene brachial plexus block   Pre-Anesthetic Checklist: ,, timeout performed, Correct Patient, Correct Site, Correct Laterality, Correct Procedure, Correct Position, site marked, Risks and benefits discussed,  Surgical consent,  Pre-op evaluation,  At surgeon's request and post-op pain management  Laterality: Left  Prep: chloraprep       Needles:  Injection technique: Single-shot  Needle Type: Echogenic Stimulator Needle     Needle Length: 9cm  Needle Gauge: 21   Needle insertion depth: 6 cm   Additional Needles:   Procedures:,,,, ultrasound used (permanent image in chart),,,,  Motor weakness within 5 minutes.  Narrative:  Start time: 03/15/2020 8:39 AM End time: 03/15/2020 8:44 AM Injection made incrementally with aspirations every 5 mL.  Performed by: Personally  Anesthesiologist: Josephine Igo, MD  Additional Notes: Timeout performed. Patient sedated. Relevant anatomy ID'd using [place]. Incremental 2-46ml injection of LA with frequent aspiration. Patient tolerated procedure well.        Left interscalene Block

## 2020-03-15 NOTE — Transfer of Care (Signed)
Immediate Anesthesia Transfer of Care Note  Patient: Deborah Elliott  Procedure(s) Performed: REVERSE SHOULDER ARTHROPLASTY (Left Shoulder)  Patient Location: PACU  Anesthesia Type:GA combined with regional for post-op pain  Level of Consciousness: sedated, patient cooperative and responds to stimulation  Airway & Oxygen Therapy: Patient Spontanous Breathing and Patient connected to face mask oxygen  Post-op Assessment: Report given to RN and Post -op Vital signs reviewed and stable  Post vital signs: Reviewed and stable  Last Vitals:  Vitals Value Taken Time  BP 119/80 03/15/20 1330  Temp    Pulse 93 03/15/20 1400  Resp 26 03/15/20 1400  SpO2 91 % 03/15/20 1400  Vitals shown include unvalidated device data.  Last Pain:  Vitals:   03/15/20 0809  TempSrc: Oral         Complications: No apparent anesthesia complications

## 2020-03-15 NOTE — Anesthesia Procedure Notes (Signed)
Procedure Name: Intubation Date/Time: 03/15/2020 10:20 AM Performed by: Myna Bright, CRNA Pre-anesthesia Checklist: Patient identified, Emergency Drugs available, Suction available and Patient being monitored Patient Re-evaluated:Patient Re-evaluated prior to induction Oxygen Delivery Method: Circle system utilized Preoxygenation: Pre-oxygenation with 100% oxygen Induction Type: IV induction Ventilation: Mask ventilation without difficulty Laryngoscope Size: Mac and 3 Grade View: Grade I Tube type: Oral Tube size: 7.0 mm Number of attempts: 1 Airway Equipment and Method: Stylet Placement Confirmation: ETT inserted through vocal cords under direct vision,  positive ETCO2 and breath sounds checked- equal and bilateral Secured at: 21 cm Tube secured with: Tape Dental Injury: Teeth and Oropharynx as per pre-operative assessment

## 2020-03-15 NOTE — Discharge Instructions (Signed)
 Kevin M. Supple, M.D., F.A.A.O.S. Orthopaedic Surgery Specializing in Arthroscopic and Reconstructive Surgery of the Shoulder [phone] [address] - [place], [place] [address] - Fax [phone]   POST-OP TOTAL SHOULDER REPLACEMENT INSTRUCTIONS  1. Follow up in the office for your first post-op appointment 10-14 days from the date of your surgery. If you do not already have a scheduled appointment, our office will contact you to schedule.  2. The bandage over your incision is waterproof. You may begin showering with this dressing on. You may leave this dressing on until first follow up appointment within 2 weeks. We prefer you leave this dressing in place until follow up however after 5-7 days if you are having itching or skin irritation and would like to remove it you may do so. Go slow and tug at the borders gently to break the bond the dressing has with the skin. At this point if there is no drainage it is okay to go without a bandage or you may cover it with a light guaze and tape. You can also expect significant bruising around your shoulder that will drift down your arm and into your chest wall. This is very normal and should resolve over several days.   3. Wear your sling/immobilizer at all times except to perform the exercises below or to occasionally let your arm dangle by your side to stretch your elbow. You also need to sleep in your sling immobilizer until instructed otherwise. It is ok to remove your sling if you are sitting in a controlled environment and allow your arm to rest in a position of comfort by your side or on your lap with pillows to give your neck and skin a break from the sling. You may remove it to allow arm to dangle by side to shower. If you are up walking around and when you go to sleep at night you need to wear it.  4. Range of motion to your elbow, wrist, and hand are encouraged 3-5 times daily. Exercise to your hand and fingers helps to reduce  swelling you may experience.   5. Prescriptions for a pain medication and a muscle relaxant are provided for you. It is recommended that if you are experiencing pain that you pain medication alone is not controlling, add the muscle relaxant along with the pain medication which can give additional pain relief. The first 1-2 days is generally the most severe of your pain and then should gradually decrease. As your pain lessens it is recommended that you decrease your use of the pain medications to an "as needed basis'" only and to always comply with the recommended dosages of the pain medications.  6. Pain medications can produce constipation along with their use. If you experience this, the use of an over the counter stool softener or laxative daily is recommended.   7. For additional questions or concerns, please do not hesitate to call the office. If after hours there is an answering service to forward your concerns to the physician on call.  8.Pain control following an exparel block  To help control your post-operative pain you received a nerve block  performed with Exparel which is a long acting anesthetic (numbing agent) which can provide pain relief and sensations of numbness (and relief of pain) in the operative shoulder and arm for up to 3 days. Sometimes it provides mixed relief, meaning you may still have numbness in certain areas of the arm but can still be able to   move  parts of that arm, hand, and fingers. We recommend that your prescribed pain medications  be used as needed. We do not feel it is necessary to "pre medicate" and "stay ahead" of pain.  Taking narcotic pain medications when you are not having any pain can lead to unnecessary and potentially dangerous side effects.    9. Use the ice machine as much as possible in the first 5-7 days from surgery, then you can wean its use to as needed. The ice typically needs to be replaced every 6 hours, instead of ice you can actually freeze  water bottles to put in the cooler and then fill water around them to avoid having to purchase ice. You can have spare water bottles freezing to allow you to rotate them once they have melted. Try to have a thin shirt or light cloth or towel under the ice wrap to protect your skin.   10.  We recommend that you avoid any dental work or cleaning in the first 3 months following your joint replacement. This is to help minimize the possibility of infection from the bacteria in your mouth that enters your bloodstream during dental work. We also recommend that you take an antibiotic prior to your dental work for the first year after your shoulder replacement to further help reduce that risk. Please simply contact our office for antibiotics to be sent to your pharmacy prior to dental work.  POST-OP EXERCISES  Pendulum Exercises  Perform pendulum exercises while standing and bending at the waist. Support your uninvolved arm on a table or chair and allow your operated arm to hang freely. Make sure to do these exercises passively - not using you shoulder muscles. These exercises can be performed once your nerve block effects have worn off.  Repeat 20 times. Do 3 sessions per day.

## 2020-03-15 NOTE — Progress Notes (Signed)
Assisted Dr. Royce Macadamia with left, ultrasound guided, interscalene  block. Side rails up, monitors on throughout procedure. See vital signs in flow sheet. Tolerated Procedure well.

## 2020-03-16 ENCOUNTER — Encounter: Payer: Self-pay | Admitting: *Deleted

## 2020-03-16 DIAGNOSIS — S42142A Displaced fracture of glenoid cavity of scapula, left shoulder, initial encounter for closed fracture: Secondary | ICD-10-CM | POA: Diagnosis not present

## 2020-03-16 LAB — GLUCOSE, CAPILLARY: Glucose-Capillary: 157 mg/dL — ABNORMAL HIGH (ref 70–99)

## 2020-03-16 MED ORDER — ONDANSETRON HCL 4 MG PO TABS: 4.0000 mg | ORAL_TABLET | Freq: Three times a day (TID) | ORAL | 0 refills | Status: DC | PRN

## 2020-03-16 MED ORDER — LISINOPRIL 20 MG PO TABS
20.0000 mg | ORAL_TABLET | Freq: Every day | ORAL | Status: DC
  Administered 2020-03-16: 20 mg via ORAL
  Filled 2020-03-16: qty 1

## 2020-03-16 MED ORDER — OXYCODONE-ACETAMINOPHEN 5-325 MG PO TABS: 1.0000 | ORAL_TABLET | ORAL | 0 refills | Status: DC | PRN

## 2020-03-16 MED ORDER — CYCLOBENZAPRINE HCL 10 MG PO TABS: 10.0000 mg | ORAL_TABLET | Freq: Three times a day (TID) | ORAL | 1 refills | Status: AC | PRN

## 2020-03-16 NOTE — Plan of Care (Signed)
  Problem: Education: °Goal: Knowledge of the prescribed therapeutic regimen will improve °Outcome: Adequate for Discharge °Goal: Understanding of activity limitations/precautions following surgery will improve °Outcome: Adequate for Discharge °Goal: Individualized Educational Video(s) °Outcome: Adequate for Discharge °  °Problem: Activity: °Goal: Ability to tolerate increased activity will improve °Outcome: Adequate for Discharge °  °Problem: Pain Management: °Goal: Pain level will decrease with appropriate interventions °Outcome: Adequate for Discharge °  °

## 2020-03-16 NOTE — Evaluation (Signed)
Occupational Therapy Evaluation Patient Details Name: Deborah Elliott MRN: [id number] DOB: 05/10/1946 Today's Date: 03/16/2020    History of Present Illness Patinet is a 74 year old woman s/p L reverse TSA.   Clinical Impression   Mrs. Deborah Elliott is s/p TSA without functional use of left upper extremity. Patient effected by pain medication and family members present for education. Therapist provided education and instruction to patient, spouse and daughter in regards to exercises, precautions, positioning, donning upper extremity clothing and bathing while maintaining shoulder precautions, ice and edema management and donning/doffing sling. Patient, spouse, and daughter verbalized understanding and demonstrated as needed. Patient to follow up with MD for further therapy needs.      Follow Up Recommendations  Follow surgeon's recommendation for DC plan and follow-up therapies    Equipment Recommendations       Recommendations for Other Services       Precautions / Restrictions Precautions Precautions: Shoulder Type of Shoulder Precautions: If sitting in controlled environment, ok to come out of sling to give neck a break. Please sleep in it to protect until follow up in office. OK to use operative arm for feeding, hygiene and ADLs. Ok to instruct Pendulums and lap slides as exercises. Ok to use operative arm within the following parameters for ADL purposes New ROM (8/18) Ok for PROM, AAROM, AROM within pain tolerance and within the following ROM ER 20 ABD 45 FE 60 Shoulder Interventions: Shoulder sling/immobilizer;Off for dressing/bathing/exercises;At all times Restrictions Weight Bearing Restrictions: Yes LUE Weight Bearing: Non weight bearing      Mobility Bed Mobility               General bed mobility comments: min assist to transfer to side of bed  Transfers                 General transfer comment: min guard for transfer to recliner and standing for dressing  secondary to effects of medication/drowsiness.    Balance Overall balance assessment: Mild deficits observed, not formally tested                                         ADL either performed or assessed with clinical judgement   ADL Overall ADL's : Needs assistance/impaired         Upper Body Bathing: Minimal assistance   Lower Body Bathing: Maximal assistance;Sit to/from stand   Upper Body Dressing : Moderate assistance;Sitting   Lower Body Dressing: Maximal assistance;Sit to/from stand               Functional mobility during ADLs: Min guard       Vision   Vision Assessment?: No apparent visual deficits     Perception     Praxis      Pertinent Vitals/Pain Pain Assessment: Faces Faces Pain Scale: Hurts little more     Hand Dominance     Extremity/Trunk Assessment Upper Extremity Assessment Upper Extremity Assessment: LUE deficits/detail LUE Deficits / Details: Deficits secondary to block and s/p shoulder replacement.           Communication     Cognition Arousal/Alertness: Suspect due to medications Behavior During Therapy: WFL for tasks assessed/performed Overall Cognitive Status: Within Functional Limits for tasks assessed  General Comments       Exercises     Shoulder Instructions Shoulder Instructions Donning/doffing shirt without moving shoulder: Caregiver independent with task Method for sponge bathing under operated UE: Caregiver independent with task Donning/doffing sling/immobilizer: Caregiver independent with task Correct positioning of sling/immobilizer: Caregiver independent with task Pendulum exercises (written home exercise program): Caregiver independent with task ROM for elbow, wrist and digits of operated UE: Caregiver independent with task Sling wearing schedule (on at all times/off for ADL's): Caregiver independent with task Proper positioning of operated  UE when showering: Caregiver independent with task Dressing change: Caregiver independent with task Positioning of UE while sleeping: Caregiver independent with task    Home Living Family/patient expects to be discharged to:: Private residence Living Arrangements: Spouse/significant other;Children Available Help at Discharge: Family Type of Home: House                 Bathroom Toilet: Handicapped height                Prior Functioning/Environment                   OT Problem List: Decreased strength;Decreased range of motion;Pain      OT Treatment/Interventions:      OT Goals(Current goals can be found in the care plan section) Acute Rehab OT Goals OT Goal Formulation: All assessment and education complete, DC therapy  OT Frequency:     Barriers to D/C:            Co-evaluation              AM-PAC OT "6 Clicks" Daily Activity     Outcome Measure Help from another person eating meals?: A Little Help from another person taking care of personal grooming?: A Little Help from another person toileting, which includes using toliet, bedpan, or urinal?: A Lot Help from another person bathing (including washing, rinsing, drying)?: A Lot Help from another person to put on and taking off regular upper body clothing?: A Lot Help from another person to put on and taking off regular lower body clothing?: A Lot 6 Click Score: 14   End of Session    Activity Tolerance: Treatment limited secondary to medical complications (Comment) Patient left: in chair;with family/visitor present  OT Visit Diagnosis: Muscle weakness (generalized) (M62.81)                Time: [id number] OT Time Calculation (min): 28 min Charges:  OT General Charges $OT Visit: 1 Visit OT Evaluation $OT Eval Low Complexity: 1 Low OT Treatments $Self Care/Home Management : 8-22 mins  Derl Barrow, OTR/L Acute Care Rehab Services  Office [phone]   Lenward Chancellor 03/16/2020, 2:04 PM

## 2020-04-13 ENCOUNTER — Encounter: Payer: Self-pay | Admitting: Physical Therapy

## 2020-04-13 ENCOUNTER — Ambulatory Visit: Payer: Medicare Other | Attending: Orthopedic Surgery | Admitting: Physical Therapy

## 2020-04-13 ENCOUNTER — Other Ambulatory Visit: Payer: Self-pay

## 2020-04-13 DIAGNOSIS — M25612 Stiffness of left shoulder, not elsewhere classified: Secondary | ICD-10-CM | POA: Diagnosis present

## 2020-04-13 DIAGNOSIS — M25512 Pain in left shoulder: Secondary | ICD-10-CM | POA: Diagnosis present

## 2020-04-13 DIAGNOSIS — M6281 Muscle weakness (generalized): Secondary | ICD-10-CM

## 2020-04-13 NOTE — Therapy (Signed)
[facility] [place], [place], [address] Phone: [phone]   Fax:  [phone]  Physical Therapy Evaluation  Patient Details  Name: Deborah Elliott MRN: [id number] Date of Birth: [birth year] Referring Provider (PT): Jenetta Loges, [place]   Encounter Date: 04/13/2020   PT End of Session - 04/13/20 1236    Visit Number 1    Number of Visits 12    Date for PT Re-Evaluation 07/06/20    Authorization Type FOTO (initial: 52%); progress note at 10th visit; KX modifier at 15th visit    PT Start Time 1105    PT Stop Time 1156    PT Time Calculation (min) 51 min    Equipment Utilized During Treatment Other (comment)   SPC   Activity Tolerance Patient tolerated treatment well    Behavior During Therapy [facility] for tasks assessed/performed           Past Medical History:  Diagnosis Date  . Achilles rupture, left 04/06/2012  . Arthritis of knee   . Cancer ([place])    skin cancer  . Coronary artery disease    X3 STENTS  . Diabetes mellitus without complication ([place])    type 2   . Dysrhythmia    HX of tachycardia - controlled with med  . GERD (gastroesophageal reflux disease)   . Hx of skin cancer, basal cell   . Hyperlipidemia   . Hypertension   . Leg cramps   . Obesity   . Sleep apnea    uses c-pap   . Vertigo     Past Surgical History:  Procedure Laterality Date  . APPENDECTOMY  48 YRS AGO  . COLONOSCOPY    . NM MYOCAR PERF WALL MOTION  05/27/2010   normal  . OPEN REDUCTION INTERNAL FIXATION (ORIF) DISTAL RADIAL FRACTURE Left 01/06/2020   Procedure: OPEN REDUCTION INTERNAL FIXATION (ORIF) DISTAL RADIAL FRACTURE AND REPAIR AS NECESSARY;  Surgeon: Roseanne Kaufman, MD;  Location: [facility];  Service: Orthopedics;  Laterality: Left;  Block with IV sed 60 mins  . REVERSE SHOULDER ARTHROPLASTY Left 03/15/2020   Procedure: REVERSE SHOULDER ARTHROPLASTY;  Surgeon: Justice Britain, MD;  Location: [facility];  Service: Orthopedics;  Laterality:  Left;  178min  . SKIN CANCER EXCISION    . STENTED CORONARY ARTERY  03/14/2010   X3 STENTS; tandem proximal and mid LAD  . TOTAL KNEE ARTHROPLASTY Right 08/29/2013   Procedure: RIGHT TOTAL KNEE ARTHROPLASTY;  Surgeon: Gearlean Alf, MD;  Location: [facility];  Service: Orthopedics;  Laterality: Right;  . TOTAL KNEE ARTHROPLASTY Left 08/16/2018   Procedure: LEFT TOTAL KNEE ARTHROPLASTY;  Surgeon: Gaynelle Arabian, MD;  Location: [facility];  Service: Orthopedics;  Laterality: Left;  16min    There were no vitals filed for this visit.    Subjective Assessment - 04/13/20 1220    Subjective COVID-19 screening performed upon arrival.Patient arrives to physical therapy with reports of right shoulder pain and decreased left shoulder ROM secondary to a fall in March 2021. Patient underwent a left reverse total shoulder replacement on 03/15/2020. Patient reports gaining assistance from her husband and daughter for dressing and showering activities. Patient has been consistent with HEP. Patient no longer wears a sling as she has an olecranon bursitis that developed. Patient rates pain at worst as 3/10 and pain at best as 0/10 with Tylenol. Patient's goals are to improve movement, improve strength, and improve ability to perform ADLS and home activities.    Pertinent History L reverse  TSA 03/15/2020, L wrist ORIF; HTN, DM, CAD x3 stents, bilateral TKA    Limitations Lifting;House hold activities    Patient Stated Goals move arm better    Currently in Pain? Yes    Pain Score 1     Pain Location Shoulder    Pain Orientation Left    Pain Descriptors / Indicators Shooting;Throbbing    Pain Type Surgical pain    Pain Onset 1 to 4 weeks ago    Pain Frequency Intermittent    Aggravating Factors  movement    Pain Relieving Factors tylenol, ice    Effect of Pain on Daily Activities requires A for dressing and showering activities              [facility] PT Assessment - 04/13/20 0001      Assessment   Medical  Diagnosis Presence of left artifical shoulder joint    Referring Provider (PT) Jenetta Loges, PA-C    Onset Date/Surgical Date 03/15/20    Hand Dominance Right    Next MD Visit 04/25/2020    Prior Therapy no      Precautions   Precautions Shoulder    Type of Shoulder Precautions follow Dr. Susie Cassette RTSA protocol      Restrictions   Weight Bearing Restrictions Yes    LUE Weight Bearing Non weight bearing      Balance Screen   Has the patient fallen in the past 6 months Yes    How many times? 2    Has the patient had a decrease in activity level because of a fear of falling?  Yes    Is the patient reluctant to leave their home because of a fear of falling?  Yes      [place] Private residence    Living Arrangements Spouse/significant other    Available Help at Discharge Family    Type of [place]      Prior Function   Level of Independence Needs assistance with ADLs      Observation/Other Assessments   Observations notable L olecranon bursitis, slight warmth to touch    Skin Integrity incision well dry and well healed    Focus on Therapeutic Outcomes (FOTO)  52% limitation      ROM / Strength   AROM / PROM / Strength PROM      PROM   Overall PROM  Deficits;Due to precautions    PROM Assessment Site Shoulder    Right/Left Shoulder Left    Left Shoulder Flexion 116 Degrees    Left Shoulder ABduction 78 Degrees    Left Shoulder External Rotation 45 Degrees      Transfers   Comments needs min A for supine to sit                      Objective measurements completed on examination: See above findings.       [facility] Adult PT Treatment/Exercise - 04/13/20 0001      Modalities   Modalities Vasopneumatic      Vasopneumatic   Number Minutes Vasopneumatic  15 minutes    Vasopnuematic Location  Shoulder    Vasopneumatic Pressure Low    Vasopneumatic Temperature  48                  PT Education - 04/13/20 1235     Education Details shoulder ABD isometric, shoulder ER isometric, upper cuts    Person(s) Educated Patient  Methods Explanation;Demonstration;Handout    Comprehension Verbalized understanding;Returned demonstration               PT Long Term Goals - 04/13/20 1241      PT LONG TERM GOAL #1   Title Patient will be independent with HEP    Time 12    Period Weeks    Status New      PT LONG TERM GOAL #2   Title Patient will demonstrate 130+ degrees of left shoulder flexion AROM to improve ability to perform functional tasks.    Time 12    Period Weeks    Status New      PT LONG TERM GOAL #3   Title Patient will demonstate 60+ degrees of left shoulder ER AROM to improve ability to don/doff apparel.    Time 12    Period Weeks    Status New      PT LONG TERM GOAL #4   Title Patient will demonstrate 4/5 or greater left shoulder MMT in all planes to improve stability during functional tasks.    Time 12    Period Days    Status New      PT LONG TERM GOAL #5   Title Patinet will report ability to independently perform ADLs and household activities with left shoulder pain less than or equal to 2/10.    Time 12    Period Weeks    Status New                  Plan - 04/13/20 1238    Clinical Impression Statement Patient is a 74 year old right handed female who presents to physical therapy with her husband with decreased left shoulder ROM and decreased left shoulder MMT secondary to a left reverse total shoulder replacement on 03/15/2020. Patient's incision is healed well with some notable scar tissue upon palpation. Patient required min A for supine to sit transfer but independent with sit to supine. Patient and PT discussed plan of care and HEP to maximize PT benefit per protocol. Due to financial constraints, patient would like to attend PT at 1x per week with emphasis on HEP. Patient would benefit from skilled physical therapy to address deficits and address patient's goals.     Personal Factors and Comorbidities Comorbidity 2;Age    Comorbidities L reverse TSA 03/15/2020, L wrist ORIF; HTN, DM, CAD x3 stents, bilateral TKA    Examination-Activity Limitations Bathing;Hygiene/Grooming;Dressing;Carry;Reach Overhead;Lift    Stability/Clinical Decision Making Stable/Uncomplicated    Clinical Decision Making Low    Rehab Potential Excellent    PT Frequency 1x / week    PT Duration 12 weeks    PT Treatment/Interventions ADLs/Self Care Home Management;Cryotherapy;Electrical Stimulation;Iontophoresis 4mg /ml Dexamethasone;Moist Heat;Manual techniques;Passive range of motion;Therapeutic activities;Functional mobility training;Therapeutic exercise;Patient/family education;Vasopneumatic Device    PT Next Visit Plan 4 weeks post op 04/12/2020; pulleys, UE ranger, initiate AAROM; PROM per protocol; modalities PRN for pain relief    PT Home Exercise Plan see patient education section    Consulted and Agree with Plan of Care Patient;Family member/caregiver    Family Member Consulted Husband           Patient will benefit from skilled therapeutic intervention in order to improve the following deficits and impairments:  Decreased strength, Decreased range of motion, Impaired UE functional use, Pain, Decreased activity tolerance, Postural dysfunction  Visit Diagnosis: Stiffness of left shoulder, not elsewhere classified - Plan: PT plan of care cert/re-cert  Acute pain of left shoulder -  Plan: PT plan of care cert/re-cert  Muscle weakness (generalized) - Plan: PT plan of care cert/re-cert     Problem List Patient Active Problem List   Diagnosis Date Noted  . 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 Encounter for orthopedic follow-up care 01/06/2020  . Closed fracture of distal end of left radius 01/02/2020  . Pain in joint of left shoulder 01/02/2020  . Aftercare 10/25/2018  . Pain in left knee 06/18/2018  . Postoperative anemia due to acute blood  loss 08/30/2013  . OA (osteoarthritis) of knee 08/29/2013  . Coronary artery disease 03/24/2013  . Obesity (BMI 35.0-39.9 without comorbidity) 03/24/2013  . Essential hypertension 03/24/2013  . Dyslipidemia 03/24/2013    Gabriela Eves, PT, DPT 04/13/2020, 12:51 PM  [facility] [address] [place], [place], [address] Phone: [phone]   Fax:  [phone]  Name: Deborah Elliott MRN: [id number] Date of Birth: 09-05-1946

## 2020-04-20 ENCOUNTER — Ambulatory Visit: Payer: Medicare Other | Attending: Orthopedic Surgery | Admitting: Physical Therapy

## 2020-04-20 ENCOUNTER — Other Ambulatory Visit: Payer: Self-pay

## 2020-04-20 DIAGNOSIS — M25512 Pain in left shoulder: Secondary | ICD-10-CM | POA: Insufficient documentation

## 2020-04-20 DIAGNOSIS — R600 Localized edema: Secondary | ICD-10-CM | POA: Diagnosis present

## 2020-04-20 DIAGNOSIS — M25612 Stiffness of left shoulder, not elsewhere classified: Secondary | ICD-10-CM

## 2020-04-20 DIAGNOSIS — M6281 Muscle weakness (generalized): Secondary | ICD-10-CM

## 2020-04-20 NOTE — Therapy (Signed)
[facility] [place], [place], [address] Phone: [phone]   Fax:  [phone]  Physical Therapy Treatment  Patient Details  Name: Deborah Elliott MRN: [id number] Date of Birth: 1945-12-11 Referring Provider (PT): Jenetta Loges, [place]   Encounter Date: 04/20/2020   PT End of Session - 04/20/20 1124    Visit Number 2    Number of Visits 12    Date for PT Re-Evaluation 07/06/20    Authorization Type FOTO (initial: 52%); progress note at 10th visit; KX modifier at 15th visit    PT Start Time 1031    PT Stop Time 1123    PT Time Calculation (min) 52 min    Activity Tolerance Patient tolerated treatment well    Behavior During Therapy [facility] for tasks assessed/performed           Past Medical History:  Diagnosis Date  . Achilles rupture, left 04/06/2012  . Arthritis of knee   . Cancer ([place])    skin cancer  . Coronary artery disease    X3 STENTS  . Diabetes mellitus without complication ([place])    type 2   . Dysrhythmia    HX of tachycardia - controlled with med  . GERD (gastroesophageal reflux disease)   . Hx of skin cancer, basal cell   . Hyperlipidemia   . Hypertension   . Leg cramps   . Obesity   . Sleep apnea    uses c-pap   . Vertigo     Past Surgical History:  Procedure Laterality Date  . APPENDECTOMY  48 YRS AGO  . COLONOSCOPY    . NM MYOCAR PERF WALL MOTION  05/27/2010   normal  . OPEN REDUCTION INTERNAL FIXATION (ORIF) DISTAL RADIAL FRACTURE Left 01/06/2020   Procedure: OPEN REDUCTION INTERNAL FIXATION (ORIF) DISTAL RADIAL FRACTURE AND REPAIR AS NECESSARY;  Surgeon: Roseanne Kaufman, MD;  Location: [facility];  Service: Orthopedics;  Laterality: Left;  Block with IV sed 60 mins  . REVERSE SHOULDER ARTHROPLASTY Left 03/15/2020   Procedure: REVERSE SHOULDER ARTHROPLASTY;  Surgeon: Justice Britain, MD;  [facility];  Service: Orthopedics;  Laterality: Left;  122min  . SKIN CANCER EXCISION    . STENTED CORONARY  ARTERY  03/14/2010   X3 STENTS; tandem proximal and mid LAD  . TOTAL KNEE ARTHROPLASTY Right 08/29/2013   Procedure: RIGHT TOTAL KNEE ARTHROPLASTY;  Surgeon: Gearlean Alf, MD;  [facility];  Service: Orthopedics;  Laterality: Right;  . TOTAL KNEE ARTHROPLASTY Left 08/16/2018   Procedure: LEFT TOTAL KNEE ARTHROPLASTY;  Surgeon: Gaynelle Arabian, MD;  [facility];  Service: Orthopedics;  Laterality: Left;  64min    There were no vitals filed for this visit.   Subjective Assessment - 04/20/20 1244    Subjective COVID-19 screening performed upon arrival. Reports doing well with no reports of increase of pain. Patient requested to review HEP.    Pertinent History L reverse TSA 03/15/2020, L wrist ORIF; HTN, DM, CAD x3 stents, bilateral TKA    Limitations Lifting;House hold activities    Patient Stated Goals move arm better    Currently in Pain? No/denies              [facility] PT Assessment - 04/20/20 0001      Assessment   Medical Diagnosis Presence of left artifical shoulder joint    Referring Provider (PT) Jenetta Loges, PA-C    Onset Date/Surgical Date 03/15/20    Hand Dominance Right  Next MD Visit 04/25/2020    Prior Therapy no      Precautions   Precautions Shoulder    Type of Shoulder Precautions follow Dr. Susie Cassette RTSA protocol                         [facility] Adult PT Treatment/Exercise - 04/20/20 0001      Exercises   Exercises Shoulder      Shoulder Exercises: Pulleys   Flexion 5 minutes      Shoulder Exercises: ROM/Strengthening   Ranger seated ranger flexion, CW, CCW x2 mins each      Shoulder Exercises: Isometric Strengthening   External Rotation 5X5"    ABduction 5X5"      Modalities   Modalities Vasopneumatic      Vasopneumatic   Number Minutes Vasopneumatic  10 minutes    Vasopnuematic Location  Shoulder    Vasopneumatic Pressure Low    Vasopneumatic Temperature  48      Manual Therapy   Manual Therapy Passive ROM     Passive ROM PROM to left shoulder into flexion, ER and elbow extension, intermittent oscillatons to decrease guarding; isometric for ER with good response.                       PT Long Term Goals - 04/13/20 1241      PT LONG TERM GOAL #1   Title Patient will be independent with HEP    Time 12    Period Weeks    Status New      PT LONG TERM GOAL #2   Title Patient will demonstrate 130+ degrees of left shoulder flexion AROM to improve ability to perform functional tasks.    Time 12    Period Weeks    Status New      PT LONG TERM GOAL #3   Title Patient will demonstate 60+ degrees of left shoulder ER AROM to improve ability to don/doff apparel.    Time 12    Period Weeks    Status New      PT LONG TERM GOAL #4   Title Patient will demonstrate 4/5 or greater left shoulder MMT in all planes to improve stability during functional tasks.    Time 12    Period Days    Status New      PT LONG TERM GOAL #5   Title Patinet will report ability to independently perform ADLs and household activities with left shoulder pain less than or equal to 2/10.    Time 12    Period Weeks    Status New                 Plan - 04/20/20 1236    Clinical Impression Statement Patient responded well to therapy session with no reports of increased pain. Patient initiated with pulleys and AAROM with no complaints. Patient provided with verbal and tactile cuing for proper technique. PT and patient reviewed HEP to which patient reported understanding. No adverse affects upon removal of modalities.    Personal Factors and Comorbidities Comorbidity 2;Age    Comorbidities L reverse TSA 03/15/2020, L wrist ORIF; HTN, DM, CAD x3 stents, bilateral TKA    Examination-Activity Limitations Bathing;Hygiene/Grooming;Dressing;Carry;Reach Overhead;Lift    Stability/Clinical Decision Making Stable/Uncomplicated    Clinical Decision Making Low    Rehab Potential Excellent    PT Frequency 1x / week     PT Duration 12 weeks  PT Treatment/Interventions ADLs/Self Care Home Management;Cryotherapy;Electrical Stimulation;Iontophoresis 4mg /ml Dexamethasone;Moist Heat;Manual techniques;Passive range of motion;Therapeutic activities;Functional mobility training;Therapeutic exercise;Patient/family education;Vasopneumatic Device    PT Next Visit Plan 4 weeks post op 04/12/2020; pulleys, UE ranger, initiate AAROM; PROM per protocol; modalities PRN for pain relief    PT Home Exercise Plan see patient education section    Consulted and Agree with Plan of Care Patient;Family member/caregiver    Family Member Consulted Husband           Patient will benefit from skilled therapeutic intervention in order to improve the following deficits and impairments:  Decreased strength, Decreased range of motion, Impaired UE functional use, Pain, Decreased activity tolerance, Postural dysfunction  Visit Diagnosis: Stiffness of left shoulder, not elsewhere classified  Acute pain of left shoulder  Muscle weakness (generalized)     Problem List Patient Active Problem List   Diagnosis Date Noted  . 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 Encounter for orthopedic follow-up care 01/06/2020  . Closed fracture of distal end of left radius 01/02/2020  . Pain in joint of left shoulder 01/02/2020  . Aftercare 10/25/2018  . Pain in left knee 06/18/2018  . Postoperative anemia due to acute blood loss 08/30/2013  . OA (osteoarthritis) of knee 08/29/2013  . Coronary artery disease 03/24/2013  . Obesity (BMI 35.0-39.9 without comorbidity) 03/24/2013  . Essential hypertension 03/24/2013  . Dyslipidemia 03/24/2013    Gabriela Eves, PT, DPT 04/20/2020, 12:46 PM  [facility] [place], [place], [address] Phone: [phone]   Fax:  [phone]  Name: Deborah Elliott MRN: [id number] Date of Birth: Mar 04, 1946

## 2020-04-20 NOTE — Therapy (Deleted)
[facility] [place], [place], [address] Phone: [phone]   Fax:  [phone]  Patient Details  Name: BRITTINY LEVITZ MRN: [id number] Date of Birth: [birth year] Referring Provider:  Burnard Bunting, MD  Encounter Date: 04/20/2020   Gabriela Eves 04/20/2020, 12:44 PM  [facility] [address] [place], [place], [address] Phone: [phone]   Fax:  [phone]

## 2020-04-27 ENCOUNTER — Other Ambulatory Visit: Payer: Self-pay

## 2020-04-27 ENCOUNTER — Ambulatory Visit: Payer: Medicare Other | Admitting: Physical Therapy

## 2020-04-27 ENCOUNTER — Encounter: Payer: Self-pay | Admitting: Physical Therapy

## 2020-04-27 DIAGNOSIS — M6281 Muscle weakness (generalized): Secondary | ICD-10-CM

## 2020-04-27 DIAGNOSIS — M25612 Stiffness of left shoulder, not elsewhere classified: Secondary | ICD-10-CM | POA: Diagnosis not present

## 2020-04-27 DIAGNOSIS — M25512 Pain in left shoulder: Secondary | ICD-10-CM

## 2020-04-27 NOTE — Therapy (Signed)
[facility] [place], [place], [address] Phone: [phone]   Fax:  [phone]  Physical Therapy Treatment  Patient Details  Name: Deborah Elliott MRN: [id number] Date of Birth: 09-18-1946 Referring Provider (PT): Jenetta Loges, [place]   Encounter Date: 04/27/2020   PT End of Session - 04/27/20 1544    Visit Number 3    Number of Visits 12    Date for PT Re-Evaluation 07/06/20    Authorization Type FOTO (initial: 52%); progress note at 10th visit; KX modifier at 15th visit    PT Start Time 1115    PT Stop Time 1206    PT Time Calculation (min) 51 min    Activity Tolerance Patient tolerated treatment well    Behavior During Therapy [facility] for tasks assessed/performed           Past Medical History:  Diagnosis Date  . Achilles rupture, left 04/06/2012  . Arthritis of knee   . Cancer ([place])    skin cancer  . Coronary artery disease    X3 STENTS  . Diabetes mellitus without complication ([place])    type 2   . Dysrhythmia    HX of tachycardia - controlled with med  . GERD (gastroesophageal reflux disease)   . Hx of skin cancer, basal cell   . Hyperlipidemia   . Hypertension   . Leg cramps   . Obesity   . Sleep apnea    uses c-pap   . Vertigo     Past Surgical History:  Procedure Laterality Date  . APPENDECTOMY  48 YRS AGO  . COLONOSCOPY    . NM MYOCAR PERF WALL MOTION  05/27/2010   normal  . OPEN REDUCTION INTERNAL FIXATION (ORIF) DISTAL RADIAL FRACTURE Left 01/06/2020   Procedure: OPEN REDUCTION INTERNAL FIXATION (ORIF) DISTAL RADIAL FRACTURE AND REPAIR AS NECESSARY;  Surgeon: Roseanne Kaufman, MD;  Location: [facility];  Service: Orthopedics;  Laterality: Left;  Block with IV sed 60 mins  . REVERSE SHOULDER ARTHROPLASTY Left 03/15/2020   Procedure: REVERSE SHOULDER ARTHROPLASTY;  Surgeon: Justice Britain, MD;  [facility];  Service: Orthopedics;  Laterality: Left;  161min  . SKIN CANCER EXCISION    . STENTED CORONARY  ARTERY  03/14/2010   X3 STENTS; tandem proximal and mid LAD  . TOTAL KNEE ARTHROPLASTY Right 08/29/2013   Procedure: RIGHT TOTAL KNEE ARTHROPLASTY;  Surgeon: Gearlean Alf, MD;  [facility];  Service: Orthopedics;  Laterality: Right;  . TOTAL KNEE ARTHROPLASTY Left 08/16/2018   Procedure: LEFT TOTAL KNEE ARTHROPLASTY;  Surgeon: Gaynelle Arabian, MD;  [facility];  Service: Orthopedics;  Laterality: Left;  41min    There were no vitals filed for this visit.   Subjective Assessment - 04/27/20 1543    Subjective COVID-19 screening performed upon arrival. Reports MD follow up went well. 1/10 pain currently.    Pertinent History L reverse TSA 03/15/2020, L wrist ORIF; HTN, DM, CAD x3 stents, bilateral TKA    Limitations Lifting;House hold activities    Patient Stated Goals move arm better    Currently in Pain? Yes    Pain Score 1               [facility] PT Assessment - 04/27/20 0001      Assessment   Medical Diagnosis Presence of left artifical shoulder joint    Referring Provider (PT) Jenetta Loges, PA-C    Onset Date/Surgical Date 03/15/20    Hand Dominance Right  Next MD Visit 04/25/2020    Prior Therapy no      Precautions   Precautions Shoulder    Type of Shoulder Precautions follow Dr. Susie Cassette RTSA protocol                         [facility] Adult PT Treatment/Exercise - 04/27/20 0001      Exercises   Exercises Shoulder      Shoulder Exercises: Supine   Protraction AAROM;20 reps;Both    External Rotation AAROM;20 reps;Both    Flexion AAROM;Both;20 reps      Shoulder Exercises: Pulleys   Flexion 5 minutes      Shoulder Exercises: ROM/Strengthening   Ranger seated ranger flexion, CW, CCW x3 mins each      Modalities   Modalities Vasopneumatic      Vasopneumatic   Number Minutes Vasopneumatic  10 minutes    Vasopnuematic Location  Shoulder    Vasopneumatic Pressure Low    Vasopneumatic Temperature  48      Manual Therapy   Manual Therapy  Passive ROM    Passive ROM PROM to left shoulder into flexion, ER and elbow extension, intermittent oscillatons to decrease guarding                       PT Long Term Goals - 04/13/20 1241      PT LONG TERM GOAL #1   Title Patient will be independent with HEP    Time 12    Period Weeks    Status New      PT LONG TERM GOAL #2   Title Patient will demonstrate 130+ degrees of left shoulder flexion AROM to improve ability to perform functional tasks.    Time 12    Period Weeks    Status New      PT LONG TERM GOAL #3   Title Patient will demonstate 60+ degrees of left shoulder ER AROM to improve ability to don/doff apparel.    Time 12    Period Weeks    Status New      PT LONG TERM GOAL #4   Title Patient will demonstrate 4/5 or greater left shoulder MMT in all planes to improve stability during functional tasks.    Time 12    Period Days    Status New      PT LONG TERM GOAL #5   Title Patinet will report ability to independently perform ADLs and household activities with left shoulder pain less than or equal to 2/10.    Time 12    Period Weeks    Status New                 Plan - 04/27/20 1544    Clinical Impression Statement Patient responded well to therapy session with no reports of increased pain, just slight fatigue. Patient provided with verbal and tactile cuing for proper form during AAROM shoulder ER. Patient noted with improved form for remaining reps. Normal response to modalities upon removal of modalities.    Personal Factors and Comorbidities Comorbidity 2;Age    Comorbidities L reverse TSA 03/15/2020, L wrist ORIF; HTN, DM, CAD x3 stents, bilateral TKA    Examination-Activity Limitations Bathing;Hygiene/Grooming;Dressing;Carry;Reach Overhead;Lift    Stability/Clinical Decision Making Stable/Uncomplicated    Clinical Decision Making Low    Rehab Potential Excellent    PT Frequency 1x / week    PT Duration 12 weeks    PT  Treatment/Interventions ADLs/Self Care Home Management;Cryotherapy;Electrical Stimulation;Iontophoresis 4mg /ml Dexamethasone;Moist Heat;Manual techniques;Passive range of motion;Therapeutic activities;Functional mobility training;Therapeutic exercise;Patient/family education;Vasopneumatic Device    PT Next Visit Plan 4 weeks post op 04/12/2020; pulleys, UE ranger, initiate AAROM; PROM per protocol; modalities PRN for pain relief    PT Home Exercise Plan see patient education section    Consulted and Agree with Plan of Care Patient;Family member/caregiver    Family Member Consulted Husband           Patient will benefit from skilled therapeutic intervention in order to improve the following deficits and impairments:  Decreased strength, Decreased range of motion, Impaired UE functional use, Pain, Decreased activity tolerance, Postural dysfunction  Visit Diagnosis: Stiffness of left shoulder, not elsewhere classified  Acute pain of left shoulder  Muscle weakness (generalized)     Problem List Patient Active Problem List   Diagnosis Date Noted  . 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 Encounter for orthopedic follow-up care 01/06/2020  . Closed fracture of distal end of left radius 01/02/2020  . Pain in joint of left shoulder 01/02/2020  . Aftercare 10/25/2018  . Pain in left knee 06/18/2018  . Postoperative anemia due to acute blood loss 08/30/2013  . OA (osteoarthritis) of knee 08/29/2013  . Coronary artery disease 03/24/2013  . Obesity (BMI 35.0-39.9 without comorbidity) 03/24/2013  . Essential hypertension 03/24/2013  . Dyslipidemia 03/24/2013    Gabriela Eves, PT, DPT 04/27/2020, 3:52 PM  [facility] [address] [place], [place], [address] Phone: [phone]   Fax:  [phone]  Name: Deborah Elliott MRN: [id number] Date of Birth: 1945/11/22

## 2020-05-01 ENCOUNTER — Encounter: Payer: Self-pay | Admitting: Physical Therapy

## 2020-05-01 ENCOUNTER — Ambulatory Visit: Payer: Medicare Other | Admitting: Physical Therapy

## 2020-05-01 ENCOUNTER — Other Ambulatory Visit: Payer: Self-pay

## 2020-05-01 DIAGNOSIS — M25612 Stiffness of left shoulder, not elsewhere classified: Secondary | ICD-10-CM

## 2020-05-01 DIAGNOSIS — M6281 Muscle weakness (generalized): Secondary | ICD-10-CM

## 2020-05-01 DIAGNOSIS — M25512 Pain in left shoulder: Secondary | ICD-10-CM

## 2020-05-01 NOTE — Therapy (Signed)
[facility] [place], [place], [address] Phone: [phone]   Fax:  [phone]  Physical Therapy Treatment  Patient Details  Name: Deborah Elliott MRN: [id number] Date of Birth: 03-Jun-1946 Referring Provider (PT): Jenetta Loges, [place]   Encounter Date: 05/01/2020   PT End of Session - 05/01/20 1206    Visit Number 4    Number of Visits 12    Date for PT Re-Evaluation 07/06/20    Authorization Type FOTO (initial: 52%); progress note at 10th visit; KX modifier at 15th visit    PT Start Time 1030    PT Stop Time 1120    PT Time Calculation (min) 50 min    Equipment Utilized During Treatment Other (comment)   SPC   Activity Tolerance Patient tolerated treatment well    Behavior During Therapy [facility] for tasks assessed/performed           Past Medical History:  Diagnosis Date  . Achilles rupture, left 04/06/2012  . Arthritis of knee   . Cancer ([place])    skin cancer  . Coronary artery disease    X3 STENTS  . Diabetes mellitus without complication ([place])    type 2   . Dysrhythmia    HX of tachycardia - controlled with med  . GERD (gastroesophageal reflux disease)   . Hx of skin cancer, basal cell   . Hyperlipidemia   . Hypertension   . Leg cramps   . Obesity   . Sleep apnea    uses c-pap   . Vertigo     Past Surgical History:  Procedure Laterality Date  . APPENDECTOMY  48 YRS AGO  . COLONOSCOPY    . NM MYOCAR PERF WALL MOTION  05/27/2010   normal  . OPEN REDUCTION INTERNAL FIXATION (ORIF) DISTAL RADIAL FRACTURE Left 01/06/2020   Procedure: OPEN REDUCTION INTERNAL FIXATION (ORIF) DISTAL RADIAL FRACTURE AND REPAIR AS NECESSARY;  Surgeon: Roseanne Kaufman, MD;  Location: [facility];  Service: Orthopedics;  Laterality: Left;  Block with IV sed 60 mins  . REVERSE SHOULDER ARTHROPLASTY Left 03/15/2020   Procedure: REVERSE SHOULDER ARTHROPLASTY;  Surgeon: Justice Britain, MD;  [facility];  Service: Orthopedics;  Laterality:  Left;  135min  . SKIN CANCER EXCISION    . STENTED CORONARY ARTERY  03/14/2010   X3 STENTS; tandem proximal and mid LAD  . TOTAL KNEE ARTHROPLASTY Right 08/29/2013   Procedure: RIGHT TOTAL KNEE ARTHROPLASTY;  Surgeon: Gearlean Alf, MD;  [facility];  Service: Orthopedics;  Laterality: Right;  . TOTAL KNEE ARTHROPLASTY Left 08/16/2018   Procedure: LEFT TOTAL KNEE ARTHROPLASTY;  Surgeon: Gaynelle Arabian, MD;  [facility];  Service: Orthopedics;  Laterality: Left;  58min    There were no vitals filed for this visit.   Subjective Assessment - 05/01/20 1205    Subjective COVID-19 screening performed upon arrival. Reports feeling a little sore but unsure why.    Pertinent History L reverse TSA 03/15/2020, L wrist ORIF; HTN, DM, CAD x3 stents, bilateral TKA    Limitations Lifting;House hold activities    Patient Stated Goals move arm better    Currently in Pain? Yes    Pain Score 2     Pain Location Shoulder    Pain Orientation Left    Pain Descriptors / Indicators Sore    Pain Type Surgical pain    Pain Onset 1 to 4 weeks ago    Pain Frequency Intermittent  [facility] PT Assessment - 05/01/20 0001      Assessment   Medical Diagnosis Presence of left artifical shoulder joint    Referring Provider (PT) Jenetta Loges, PA-C    Onset Date/Surgical Date 03/15/20    Hand Dominance Right    Next MD Visit 04/25/2020    Prior Therapy no      Precautions   Precautions Shoulder    Type of Shoulder Precautions follow Dr. Susie Cassette RTSA protocol                         [facility] Adult PT Treatment/Exercise - 05/01/20 0001      Exercises   Exercises Shoulder      Shoulder Exercises: Standing   Flexion AAROM;10 reps   with Prayer hands   Other Standing Exercises wall ladder x2 mins; level 26    Other Standing Exercises standing upper cuts x10      Shoulder Exercises: Pulleys   Flexion 5 minutes      Shoulder Exercises: ROM/Strengthening   Ranger seated  ranger flexion, CW, CCW x3 mins each      Modalities   Modalities Vasopneumatic      Vasopneumatic   Number Minutes Vasopneumatic  10 minutes    Vasopnuematic Location  Shoulder    Vasopneumatic Pressure Low    Vasopneumatic Temperature  48      Manual Therapy   Manual Therapy Passive ROM    Passive ROM PROM to left shoulder into flexion, ER and elbow extension, intermittent oscillatons to decrease guarding                       PT Long Term Goals - 04/13/20 1241      PT LONG TERM GOAL #1   Title Patient will be independent with HEP    Time 12    Period Weeks    Status New      PT LONG TERM GOAL #2   Title Patient will demonstrate 130+ degrees of left shoulder flexion AROM to improve ability to perform functional tasks.    Time 12    Period Weeks    Status New      PT LONG TERM GOAL #3   Title Patient will demonstate 60+ degrees of left shoulder ER AROM to improve ability to don/doff apparel.    Time 12    Period Weeks    Status New      PT LONG TERM GOAL #4   Title Patient will demonstrate 4/5 or greater left shoulder MMT in all planes to improve stability during functional tasks.    Time 12    Period Days    Status New      PT LONG TERM GOAL #5   Title Patinet will report ability to independently perform ADLs and household activities with left shoulder pain less than or equal to 2/10.    Time 12    Period Weeks    Status New                 Plan - 05/01/20 1302    Clinical Impression Statement Patient responded well to the progression of exercises with minimal reports of increased soreness. Patient and PT discussed muscles are weak at this time therefore soreness is to be expected. Smooth PROM but with one instance of pain with PROM flexion. Patient denied any increase of pain at end of session. No adverse effects upon removal of modalities.  Personal Factors and Comorbidities Comorbidity 2;Age    Comorbidities L reverse TSA 03/15/2020, L  wrist ORIF; HTN, DM, CAD x3 stents, bilateral TKA    Examination-Activity Limitations Bathing;Hygiene/Grooming;Dressing;Carry;Reach Overhead;Lift    Stability/Clinical Decision Making Stable/Uncomplicated    Clinical Decision Making Low    Rehab Potential Excellent    PT Frequency 1x / week    PT Duration 12 weeks    PT Treatment/Interventions ADLs/Self Care Home Management;Cryotherapy;Electrical Stimulation;Iontophoresis 4mg /ml Dexamethasone;Moist Heat;Manual techniques;Passive range of motion;Therapeutic activities;Functional mobility training;Therapeutic exercise;Patient/family education;Vasopneumatic Device    PT Next Visit Plan 8 weeks post op 04/12/2020; pulleys, UE ranger, initiate AAROM; PROM per protocol; modalities PRN for pain relief    PT Home Exercise Plan see patient education section    Consulted and Agree with Plan of Care Patient;Family member/caregiver    Family Member Consulted Husband           Patient will benefit from skilled therapeutic intervention in order to improve the following deficits and impairments:  Decreased strength, Decreased range of motion, Impaired UE functional use, Pain, Decreased activity tolerance, Postural dysfunction  Visit Diagnosis: Stiffness of left shoulder, not elsewhere classified  Acute pain of left shoulder  Muscle weakness (generalized)     Problem List Patient Active Problem List   Diagnosis Date Noted  . 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 Encounter for orthopedic follow-up care 01/06/2020  . Closed fracture of distal end of left radius 01/02/2020  . Pain in joint of left shoulder 01/02/2020  . Aftercare 10/25/2018  . Pain in left knee 06/18/2018  . Postoperative anemia due to acute blood loss 08/30/2013  . OA (osteoarthritis) of knee 08/29/2013  . Coronary artery disease 03/24/2013  . Obesity (BMI 35.0-39.9 without comorbidity) 03/24/2013  . Essential hypertension 03/24/2013  . Dyslipidemia  03/24/2013    Gabriela Eves, PT, DPT 05/01/2020, 1:07 PM  [facility] [address] [place], [place], [address] Phone: [phone]   Fax:  [phone]  Name: Deborah Elliott MRN: [id number] Date of Birth: 1946/05/11

## 2020-05-11 ENCOUNTER — Ambulatory Visit: Payer: Medicare Other | Admitting: Physical Therapy

## 2020-05-11 ENCOUNTER — Encounter: Payer: Self-pay | Admitting: Physical Therapy

## 2020-05-11 ENCOUNTER — Other Ambulatory Visit: Payer: Self-pay

## 2020-05-11 DIAGNOSIS — R600 Localized edema: Secondary | ICD-10-CM

## 2020-05-11 DIAGNOSIS — M25512 Pain in left shoulder: Secondary | ICD-10-CM

## 2020-05-11 DIAGNOSIS — M25612 Stiffness of left shoulder, not elsewhere classified: Secondary | ICD-10-CM | POA: Diagnosis not present

## 2020-05-11 DIAGNOSIS — M6281 Muscle weakness (generalized): Secondary | ICD-10-CM

## 2020-05-11 NOTE — Therapy (Signed)
[facility] [place], [place], [address] Phone: [phone]   Fax:  [phone]  Physical Therapy Treatment  Patient Details  Name: Deborah Elliott MRN: [id number] Date of Birth: [birth year] Referring Provider (PT): Jenetta Loges, [place]   Encounter Date: 05/11/2020   PT End of Session - 05/11/20 1225    Visit Number 5    Number of Visits 12    Date for PT Re-Evaluation 07/06/20    Authorization Type FOTO (initial: 52%); progress note at 10th visit; KX modifier at 15th visit    PT Start Time 1115    PT Stop Time 1201    PT Time Calculation (min) 46 min    Equipment Utilized During Treatment Other (comment)   SPC   Activity Tolerance Patient tolerated treatment well    Behavior During Therapy [facility] for tasks assessed/performed           Past Medical History:  Diagnosis Date   Achilles rupture, left 04/06/2012   Arthritis of knee    Cancer ([place])    skin cancer   Coronary artery disease    X3 STENTS   Diabetes mellitus without complication ([place])    type 2    Dysrhythmia    HX of tachycardia - controlled with med   GERD (gastroesophageal reflux disease)    Hx of skin cancer, basal cell    Hyperlipidemia    Hypertension    Leg cramps    Obesity    Sleep apnea    uses c-pap    Vertigo     Past Surgical History:  Procedure Laterality Date   APPENDECTOMY  30 YRS AGO   COLONOSCOPY     NM MYOCAR PERF WALL MOTION  05/27/2010   normal   OPEN REDUCTION INTERNAL FIXATION (ORIF) DISTAL RADIAL FRACTURE Left 01/06/2020   Procedure: OPEN REDUCTION INTERNAL FIXATION (ORIF) DISTAL RADIAL FRACTURE AND REPAIR AS NECESSARY;  Surgeon: Roseanne Kaufman, MD;  Location: [place];  Service: Orthopedics;  Laterality: Left;  Block with IV sed 60 mins   REVERSE SHOULDER ARTHROPLASTY Left 03/15/2020   Procedure: REVERSE SHOULDER ARTHROPLASTY;  Surgeon: Justice Britain, MD;  Location: [facility];  Service: Orthopedics;  Laterality:  Left;  150min   SKIN CANCER EXCISION     STENTED CORONARY ARTERY  03/14/2010   X3 STENTS; tandem proximal and mid LAD   TOTAL KNEE ARTHROPLASTY Right 08/29/2013   Procedure: RIGHT TOTAL KNEE ARTHROPLASTY;  Surgeon: Gearlean Alf, MD;  Location: [facility];  Service: Orthopedics;  Laterality: Right;   TOTAL KNEE ARTHROPLASTY Left 08/16/2018   Procedure: LEFT TOTAL KNEE ARTHROPLASTY;  Surgeon: Gaynelle Arabian, MD;  Location: [facility];  Service: Orthopedics;  Laterality: Left;  38min    There were no vitals filed for this visit.   Subjective Assessment - 05/11/20 1124    Subjective COVID-19 screening performed upon arrival. Reports she reached back, felt pain, and her shoulder "locked up" this week. Patient able to move her shoulder now but sore    Pertinent History L reverse TSA 03/15/2020, L wrist ORIF; HTN, DM, CAD x3 stents, bilateral TKA    Limitations Lifting;House hold activities    Patient Stated Goals move arm better    Currently in Pain? Yes    Pain Score 2     Pain Location Shoulder    Pain Orientation Left    Pain Descriptors / Indicators Sore    Pain Type Surgical pain    Pain Onset 1 to 4  weeks ago    Pain Frequency Intermittent              [facility] PT Assessment - 05/11/20 0001      Assessment   Medical Diagnosis Presence of left artifical shoulder joint    Referring Provider (PT) Jenetta Loges, PA-C    Onset Date/Surgical Date 03/15/20    Hand Dominance Right    Prior Therapy no      Precautions   Precautions Shoulder    Type of Shoulder Precautions follow Dr. Susie Cassette RTSA protocol      ROM / Strength   AROM / PROM / Strength AROM      AROM   AROM Assessment Site Shoulder    Right/Left Shoulder Left    Left Shoulder Flexion 125 Degrees                         [facility] Adult PT Treatment/Exercise - 05/11/20 0001      Exercises   Exercises Shoulder      Shoulder Exercises: Supine   Protraction AROM;Left;20 reps    Flexion 20 reps;AROM    beach chair position   ABduction AROM;Both;20 reps   beach chair position     Shoulder Exercises: Seated   External Rotation AROM;Left;20 reps   hitch hiker   Flexion AROM;Both;10 reps      Shoulder Exercises: Standing   Other Standing Exercises wall ladder x2 mins; level 27    Other Standing Exercises standing upper cuts 2x10      Shoulder Exercises: Pulleys   Flexion 5 minutes      Modalities   Modalities Vasopneumatic      Vasopneumatic   Number Minutes Vasopneumatic  10 minutes    Vasopnuematic Location  Shoulder    Vasopneumatic Pressure Low    Vasopneumatic Temperature  48      Manual Therapy   Manual Therapy Passive ROM    Passive ROM PROM to left shoulder into flexion, ER; Rhythmic stabilization in 45 degrees flexion and 90 degrees flexion                       PT Long Term Goals - 05/11/20 1228      PT LONG TERM GOAL #1   Title Patient will be independent with HEP    Time 12    Period Weeks    Status On-going      PT LONG TERM GOAL #2   Title Patient will demonstrate 130+ degrees of left shoulder flexion AROM to improve ability to perform functional tasks.    Time 12    Period Weeks    Status On-going      PT LONG TERM GOAL #3   Title Patient will demonstate 60+ degrees of left shoulder ER AROM to improve ability to don/doff apparel.    Time 12    Period Weeks    Status On-going      PT LONG TERM GOAL #4   Title Patient will demonstrate 4/5 or greater left shoulder MMT in all planes to improve stability during functional tasks.    Time 12    Period Days    Status On-going      PT LONG TERM GOAL #5   Title Patinet will report ability to independently perform ADLs and household activities with left shoulder pain less than or equal to 2/10.    Time 12    Period Weeks    Status  On-going                 Plan - 05/11/20 1225    Clinical Impression Statement Patient arrives to physical therapy with ongoing pain in left shoulder due  to it "locking up" over the week. Patient guided through TEs and progressed to more AROM TEs. Patient responded well but required intermittent cuing for technique and to prevent UT compensation. Patient instructed it was okay to perform water exercises per plan of care. Patient reported undrestanding. Rhythmic stabilization initated in various degrees of flexion; with more difficulty with perturbations distally. Normal response to modalities upon removal.    Personal Factors and Comorbidities Comorbidity 2;Age    Comorbidities L reverse TSA 03/15/2020, L wrist ORIF; HTN, DM, CAD x3 stents, bilateral TKA    Examination-Activity Limitations Bathing;Hygiene/Grooming;Dressing;Carry;Reach Overhead;Lift    Stability/Clinical Decision Making Stable/Uncomplicated    Clinical Decision Making Low    Rehab Potential Excellent    PT Frequency 1x / week    PT Duration 12 weeks    PT Treatment/Interventions ADLs/Self Care Home Management;Cryotherapy;Electrical Stimulation;Iontophoresis 4mg /ml Dexamethasone;Moist Heat;Manual techniques;Passive range of motion;Therapeutic activities;Functional mobility training;Therapeutic exercise;Patient/family education;Vasopneumatic Device    PT Next Visit Plan 8 weeks post op 04/12/2020; pulleys, UE ranger, initiate AAROM; PROM per protocol; modalities PRN for pain relief    PT Home Exercise Plan see patient education section    Consulted and Agree with Plan of Care Patient;Family member/caregiver    Family Member Consulted Husband           Patient will benefit from skilled therapeutic intervention in order to improve the following deficits and impairments:  Decreased strength, Decreased range of motion, Impaired UE functional use, Pain, Decreased activity tolerance, Postural dysfunction  Visit Diagnosis: Stiffness of left shoulder, not elsewhere classified  Acute pain of left shoulder  Muscle weakness (generalized)  Localized edema     Problem List Patient  Active Problem List   Diagnosis Date Noted   S/P reverse total shoulder arthroplasty, 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 [place], [place], [address] Phone: [phone]   Fax:  [phone]  Name: Deborah Elliott MRN: [id number] Date of Birth: 04-Dec-1945

## 2020-05-18 ENCOUNTER — Ambulatory Visit: Payer: Medicare Other | Admitting: Physical Therapy

## 2020-05-18 ENCOUNTER — Encounter: Payer: Self-pay | Admitting: Physical Therapy

## 2020-05-18 ENCOUNTER — Other Ambulatory Visit: Payer: Self-pay

## 2020-05-18 DIAGNOSIS — M25612 Stiffness of left shoulder, not elsewhere classified: Secondary | ICD-10-CM | POA: Diagnosis not present

## 2020-05-18 DIAGNOSIS — R600 Localized edema: Secondary | ICD-10-CM

## 2020-05-18 DIAGNOSIS — M6281 Muscle weakness (generalized): Secondary | ICD-10-CM

## 2020-05-18 DIAGNOSIS — M25512 Pain in left shoulder: Secondary | ICD-10-CM

## 2020-05-18 NOTE — Therapy (Signed)
[facility] [place], [place], [address] Phone: [phone]   Fax:  [phone]  Physical Therapy Treatment  Patient Details  Name: RANESSA KOSTA MRN: [id number] Date of Birth: 03-11-1946 Referring Provider (PT): Jenetta Loges, [place]   Encounter Date: 05/18/2020   PT End of Session - 05/18/20 1212    Visit Number 6    Number of Visits 12    Date for PT Re-Evaluation 07/06/20    Authorization Type FOTO (initial: 52%); progress note at 10th visit; KX modifier at 15th visit    PT Start Time 1115    PT Stop Time 1205    PT Time Calculation (min) 50 min    Equipment Utilized During Treatment Other (comment)   SPC   Activity Tolerance Patient tolerated treatment well    Behavior During Therapy [facility] for tasks assessed/performed           Past Medical History:  Diagnosis Date  . Achilles rupture, left 04/06/2012  . Arthritis of knee   . Cancer ([place])    skin cancer  . Coronary artery disease    X3 STENTS  . Diabetes mellitus without complication ([place])    type 2   . Dysrhythmia    HX of tachycardia - controlled with med  . GERD (gastroesophageal reflux disease)   . Hx of skin cancer, basal cell   . Hyperlipidemia   . Hypertension   . Leg cramps   . Obesity   . Sleep apnea    uses c-pap   . Vertigo     Past Surgical History:  Procedure Laterality Date  . APPENDECTOMY  48 YRS AGO  . COLONOSCOPY    . NM MYOCAR PERF WALL MOTION  05/27/2010   normal  . OPEN REDUCTION INTERNAL FIXATION (ORIF) DISTAL RADIAL FRACTURE Left 01/06/2020   Procedure: OPEN REDUCTION INTERNAL FIXATION (ORIF) DISTAL RADIAL FRACTURE AND REPAIR AS NECESSARY;  Surgeon: Roseanne Kaufman, MD;  Location: [place];  Service: Orthopedics;  Laterality: Left;  Block with IV sed 60 mins  . REVERSE SHOULDER ARTHROPLASTY Left 03/15/2020   Procedure: REVERSE SHOULDER ARTHROPLASTY;  Surgeon: Justice Britain, MD;  Location[facility];  Service: Orthopedics;  Laterality:  Left;  187min  . SKIN CANCER EXCISION    . STENTED CORONARY ARTERY  03/14/2010   X3 STENTS; tandem proximal and mid LAD  . TOTAL KNEE ARTHROPLASTY Right 08/29/2013   Procedure: RIGHT TOTAL KNEE ARTHROPLASTY;  Surgeon: Gearlean Alf, MD;  Location: [facility];  Service: Orthopedics;  Laterality: Right;  . TOTAL KNEE ARTHROPLASTY Left 08/16/2018   Procedure: LEFT TOTAL KNEE ARTHROPLASTY;  Surgeon: Gaynelle Arabian, MD;  Location[facility];  Service: Orthopedics;  Laterality: Left;  6min    There were no vitals filed for this visit.   Subjective Assessment - 05/18/20 1211    Subjective COVID-19 screening performed upon arrival. Patient arrives feeling pretty good and is feeling like her shoulder is getting better.    Pertinent History L reverse TSA 03/15/2020, L wrist ORIF; HTN, DM, CAD x3 stents, bilateral TKA    Limitations Lifting;House hold activities    Patient Stated Goals move arm better    Currently in Pain? No/denies              [facility] PT Assessment - 05/18/20 0001      Assessment   Medical Diagnosis Presence of left artifical shoulder joint    Referring Provider (PT) Jenetta Loges, PA-C    Onset Date/Surgical Date  03/15/20    Hand Dominance Right    Prior Therapy no      Precautions   Precautions Shoulder    Type of Shoulder Precautions follow Dr. Susie Cassette RTSA protocol      ROM / Strength   AROM / PROM / Strength AROM      AROM   Overall AROM  Deficits    AROM Assessment Site Shoulder    Right/Left Shoulder Left    Left Shoulder Flexion 124 Degrees    Left Shoulder ABduction 78 Degrees    Left Shoulder External Rotation 30 Degrees      PROM   Left Shoulder External Rotation 64 Degrees                         [facility] Adult PT Treatment/Exercise - 05/18/20 0001      Exercises   Exercises Shoulder      Shoulder Exercises: Sidelying   External Rotation AROM;Left;20 reps    Flexion AAROM;Left;20 reps      Shoulder Exercises: Standing   Flexion  AROM;Left;20 reps;10 reps    Other Standing Exercises wall ladder x2 mins; level 27      Shoulder Exercises: Pulleys   Flexion 5 minutes      Modalities   Modalities Vasopneumatic      Vasopneumatic   Number Minutes Vasopneumatic  10 minutes    Vasopnuematic Location  Shoulder    Vasopneumatic Pressure Low    Vasopneumatic Temperature  48      Manual Therapy   Manual Therapy Passive ROM    Passive ROM PROM to left shoulder into flexion, ER; Rhythmic stabilization in 45 degrees flexion 90 degrees flexion, and 120 degrees flexion; ER/IR rhythmic stabilization                       PT Long Term Goals - 05/11/20 1228      PT LONG TERM GOAL #1   Title Patient will be independent with HEP    Time 12    Period Weeks    Status On-going      PT LONG TERM GOAL #2   Title Patient will demonstrate 130+ degrees of left shoulder flexion AROM to improve ability to perform functional tasks.    Time 12    Period Weeks    Status On-going      PT LONG TERM GOAL #3   Title Patient will demonstate 60+ degrees of left shoulder ER AROM to improve ability to don/doff apparel.    Time 12    Period Weeks    Status On-going      PT LONG TERM GOAL #4   Title Patient will demonstrate 4/5 or greater left shoulder MMT in all planes to improve stability during functional tasks.    Time 12    Period Days    Status On-going      PT LONG TERM GOAL #5   Title Patinet will report ability to independently perform ADLs and household activities with left shoulder pain less than or equal to 2/10.    Time 12    Period Weeks    Status On-going                 Plan - 05/18/20 1212    Clinical Impression Statement Patient responded well to the progression of TEs. Functional left shoulder flexion into cabinet performed with good scapulohumeral rhythm but with fatigue during middle shelf flexion.  Patient guided through sidelying exercises for proper technique. AROM of left shoulder  measured with ongoing deficits. See objective measures. Normal response to vasopneumatic device upon removal.    Personal Factors and Comorbidities Comorbidity 2;Age    Comorbidities L reverse TSA 03/15/2020, L wrist ORIF; HTN, DM, CAD x3 stents, bilateral TKA    Examination-Activity Limitations Bathing;Hygiene/Grooming;Dressing;Carry;Reach Overhead;Lift    Stability/Clinical Decision Making Stable/Uncomplicated    Clinical Decision Making Low    Rehab Potential Excellent    PT Frequency 1x / week    PT Duration 12 weeks    PT Treatment/Interventions ADLs/Self Care Home Management;Cryotherapy;Electrical Stimulation;Iontophoresis 4mg /ml Dexamethasone;Moist Heat;Manual techniques;Passive range of motion;Therapeutic activities;Functional mobility training;Therapeutic exercise;Patient/family education;Vasopneumatic Device    PT Next Visit Plan 8 weeks post op 04/12/2020; pulleys, UE ranger, initiate AROM; PROM per protocol; modalities PRN for pain relief    PT Home Exercise Plan see patient education section    Consulted and Agree with Plan of Care Patient;Family member/caregiver    Family Member Consulted Husband           Patient will benefit from skilled therapeutic intervention in order to improve the following deficits and impairments:  Decreased strength, Decreased range of motion, Impaired UE functional use, Pain, Decreased activity tolerance, Postural dysfunction  Visit Diagnosis: Stiffness of left shoulder, not elsewhere classified  Muscle weakness (generalized)  Localized edema  Acute pain of left shoulder     Problem List Patient Active Problem List   Diagnosis Date Noted  . 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 Encounter for orthopedic follow-up care 01/06/2020  . Closed fracture of distal end of left radius 01/02/2020  . Pain in joint of left shoulder 01/02/2020  . Aftercare 10/25/2018  . Pain in left knee 06/18/2018  . Postoperative anemia due  to acute blood loss 08/30/2013  . OA (osteoarthritis) of knee 08/29/2013  . Coronary artery disease 03/24/2013  . Obesity (BMI 35.0-39.9 without comorbidity) 03/24/2013  . Essential hypertension 03/24/2013  . Dyslipidemia 03/24/2013    Gabriela Eves, PT, DPT 05/18/2020, 12:23 PM  [facility] [address] [place], [place], [address] Phone: [phone]   Fax:  [phone]  Name: SHELBYLYNN WALCZYK MRN: [id number] Date of Birth: [birth year]

## 2020-05-25 ENCOUNTER — Encounter: Payer: Self-pay | Admitting: Physical Therapy

## 2020-05-25 ENCOUNTER — Other Ambulatory Visit: Payer: Self-pay

## 2020-05-25 ENCOUNTER — Ambulatory Visit: Payer: Medicare Other | Attending: Orthopedic Surgery | Admitting: Physical Therapy

## 2020-05-25 DIAGNOSIS — M25612 Stiffness of left shoulder, not elsewhere classified: Secondary | ICD-10-CM | POA: Insufficient documentation

## 2020-05-25 DIAGNOSIS — R600 Localized edema: Secondary | ICD-10-CM | POA: Insufficient documentation

## 2020-05-25 DIAGNOSIS — M25512 Pain in left shoulder: Secondary | ICD-10-CM | POA: Diagnosis present

## 2020-05-25 DIAGNOSIS — M6281 Muscle weakness (generalized): Secondary | ICD-10-CM | POA: Insufficient documentation

## 2020-05-25 NOTE — Therapy (Signed)
[facility] [place], [place], [address] Phone: [phone]   Fax:  [phone]  Physical Therapy Treatment  Patient Details  Name: Deborah Elliott MRN: [id number] Date of Birth: [birth year] Referring Provider (PT): Jenetta Loges, [place]   Encounter Date: 05/25/2020   PT End of Session - 05/25/20 1121    Visit Number 7    Number of Visits 12    Date for PT Re-Evaluation 07/06/20    Authorization Type FOTO (initial: 52%); progress note at 10th visit; KX modifier at 15th visit    PT Start Time 1030    PT Stop Time 1121    PT Time Calculation (min) 51 min    Equipment Utilized During Treatment Other (comment)   SPC   Activity Tolerance Patient tolerated treatment well    Behavior During Therapy [facility] for tasks assessed/performed           Past Medical History:  Diagnosis Date  . Achilles rupture, left 04/06/2012  . Arthritis of knee   . Cancer ([place])    skin cancer  . Coronary artery disease    X3 STENTS  . Diabetes mellitus without complication ([place])    type 2   . Dysrhythmia    HX of tachycardia - controlled with med  . GERD (gastroesophageal reflux disease)   . Hx of skin cancer, basal cell   . Hyperlipidemia   . Hypertension   . Leg cramps   . Obesity   . Sleep apnea    uses c-pap   . Vertigo     Past Surgical History:  Procedure Laterality Date  . APPENDECTOMY  48 YRS AGO  . COLONOSCOPY    . NM MYOCAR PERF WALL MOTION  05/27/2010   normal  . OPEN REDUCTION INTERNAL FIXATION (ORIF) DISTAL RADIAL FRACTURE Left 01/06/2020   Procedure: OPEN REDUCTION INTERNAL FIXATION (ORIF) DISTAL RADIAL FRACTURE AND REPAIR AS NECESSARY;  Surgeon: Roseanne Kaufman, MD;  Location: [place];  Service: Orthopedics;  Laterality: Left;  Block with IV sed 60 mins  . REVERSE SHOULDER ARTHROPLASTY Left 03/15/2020   Procedure: REVERSE SHOULDER ARTHROPLASTY;  Surgeon: Justice Britain, MD;  Location: [facility];  Service: Orthopedics;  Laterality:  Left;  159min  . SKIN CANCER EXCISION    . STENTED CORONARY ARTERY  03/14/2010   X3 STENTS; tandem proximal and mid LAD  . TOTAL KNEE ARTHROPLASTY Right 08/29/2013   Procedure: RIGHT TOTAL KNEE ARTHROPLASTY;  Surgeon: Gearlean Alf, MD;  Location: [facility];  Service: Orthopedics;  Laterality: Right;  . TOTAL KNEE ARTHROPLASTY Left 08/16/2018   Procedure: LEFT TOTAL KNEE ARTHROPLASTY;  Surgeon: Gaynelle Arabian, MD;  Location: [facility];  Service: Orthopedics;  Laterality: Left;  34min    There were no vitals filed for this visit.   Subjective Assessment - 05/25/20 1121    Subjective COVID-19 screening performed upon arrival. Patient arrives feeling pretty good but with slight soreness.    Pertinent History L reverse TSA 03/15/2020, L wrist ORIF; HTN, DM, CAD x3 stents, bilateral TKA    Limitations Lifting;House hold activities    Patient Stated Goals move arm better    Currently in Pain? No/denies              [facility] PT Assessment - 05/25/20 0001      Assessment   Medical Diagnosis Presence of left artifical shoulder joint    Referring Provider (PT) Jenetta Loges, PA-C    Onset Date/Surgical Date 03/15/20    Hand  Dominance Right    Prior Therapy no      Precautions   Precautions Shoulder    Type of Shoulder Precautions follow Dr. Susie Cassette RTSA protocol                         [facility] Adult PT Treatment/Exercise - 05/25/20 0001      Exercises   Exercises Shoulder      Shoulder Exercises: Seated   Extension Strengthening;Left;Theraband;20 reps    Theraband Level (Shoulder Extension) Level 1 (Yellow)    Row Strengthening;Left;20 reps;Theraband    Theraband Level (Shoulder Row) Level 1 (Yellow)    Protraction Strengthening;Left;20 reps;Theraband    Theraband Level (Shoulder Protraction) Level 1 (Yellow)    External Rotation Strengthening;Left;20 reps;Theraband    Theraband Level (Shoulder External Rotation) Level 1 (Yellow)    Internal Rotation Strengthening     Flexion Both;AAROM;20 reps    Abduction AAROM;Both;20 reps    Other Seated Exercises scaption 2x10 with AAROM      Shoulder Exercises: Pulleys   Flexion 5 minutes      Modalities   Modalities Vasopneumatic      Vasopneumatic   Number Minutes Vasopneumatic  10 minutes    Vasopnuematic Location  Shoulder    Vasopneumatic Pressure Low    Vasopneumatic Temperature  36 for pain and edema      Manual Therapy   Manual Therapy Passive ROM    Passive ROM PROM to left shoulder into ER and abduction intermittent oscillation to decrease guarding.                        PT Long Term Goals - 05/11/20 1228      PT LONG TERM GOAL #1   Title Patient will be independent with HEP    Time 12    Period Weeks    Status On-going      PT LONG TERM GOAL #2   Title Patient will demonstrate 130+ degrees of left shoulder flexion AROM to improve ability to perform functional tasks.    Time 12    Period Weeks    Status On-going      PT LONG TERM GOAL #3   Title Patient will demonstate 60+ degrees of left shoulder ER AROM to improve ability to don/doff apparel.    Time 12    Period Weeks    Status On-going      PT LONG TERM GOAL #4   Title Patient will demonstrate 4/5 or greater left shoulder MMT in all planes to improve stability during functional tasks.    Time 12    Period Days    Status On-going      PT LONG TERM GOAL #5   Title Patinet will report ability to independently perform ADLs and household activities with left shoulder pain less than or equal to 2/10.    Time 12    Period Weeks    Status On-going                 Plan - 05/25/20 1122    Clinical Impression Statement Patient responded very well to therapy session with the addition of AROM and resisted TEs. Patient provided with intermittent tactile cuing for form and technique. Slight fatigue at the end of AROM. No adverse effects upon removal of modalities.    Personal Factors and Comorbidities Comorbidity  2;Age    Comorbidities L reverse TSA 03/15/2020, L wrist ORIF; HTN,  DM, CAD x3 stents, bilateral TKA    Examination-Activity Limitations Bathing;Hygiene/Grooming;Dressing;Carry;Reach Overhead;Lift    Stability/Clinical Decision Making Stable/Uncomplicated    Clinical Decision Making Low    Rehab Potential Excellent    PT Frequency 1x / week    PT Duration 12 weeks    PT Treatment/Interventions ADLs/Self Care Home Management;Cryotherapy;Electrical Stimulation;Iontophoresis 4mg /ml Dexamethasone;Moist Heat;Manual techniques;Passive range of motion;Therapeutic activities;Functional mobility training;Therapeutic exercise;Patient/family education;Vasopneumatic Device    PT Next Visit Plan Continue AROM and strengthening per tolerance. Seated best due to balance deficits. modalites PRN for pain relief.    PT Home Exercise Plan see patient education section    Consulted and Agree with Plan of Care Patient;Family member/caregiver           Patient will benefit from skilled therapeutic intervention in order to improve the following deficits and impairments:  Decreased strength, Decreased range of motion, Impaired UE functional use, Pain, Decreased activity tolerance, Postural dysfunction  Visit Diagnosis: Stiffness of left shoulder, not elsewhere classified  Muscle weakness (generalized)  Localized edema  Acute pain of left shoulder     Problem List Patient Active Problem List   Diagnosis Date Noted  . 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 Encounter for orthopedic follow-up care 01/06/2020  . Closed fracture of distal end of left radius 01/02/2020  . Pain in joint of left shoulder 01/02/2020  . Aftercare 10/25/2018  . Pain in left knee 06/18/2018  . Postoperative anemia due to acute blood loss 08/30/2013  . OA (osteoarthritis) of knee 08/29/2013  . Coronary artery disease 03/24/2013  . Obesity (BMI 35.0-39.9 without comorbidity) 03/24/2013  . Essential  hypertension 03/24/2013  . Dyslipidemia 03/24/2013    Gabriela Eves, PT, DPT 05/25/2020, 11:30 AM  [facility] [address] [place], [place], [address] Phone: [phone]   Fax:  [phone]  Name: Deborah Elliott MRN: [id number] Date of Birth: 02/24/1946

## 2020-06-01 ENCOUNTER — Other Ambulatory Visit: Payer: Self-pay

## 2020-06-01 ENCOUNTER — Ambulatory Visit: Payer: Medicare Other | Admitting: Physical Therapy

## 2020-06-01 ENCOUNTER — Encounter: Payer: Self-pay | Admitting: Physical Therapy

## 2020-06-01 DIAGNOSIS — M6281 Muscle weakness (generalized): Secondary | ICD-10-CM

## 2020-06-01 DIAGNOSIS — M25612 Stiffness of left shoulder, not elsewhere classified: Secondary | ICD-10-CM

## 2020-06-01 DIAGNOSIS — R600 Localized edema: Secondary | ICD-10-CM

## 2020-06-01 NOTE — Therapy (Signed)
[facility] [place], [place], [address] Phone: [phone]   Fax:  [phone]  Physical Therapy Treatment  Patient Details  Name: NYIESHA BEEVER MRN: [id number] Date of Birth: May 30, 1946 Referring Provider (PT): Jenetta Loges, [place]   Encounter Date: 06/01/2020   PT End of Session - 06/01/20 1214    Visit Number 8    Number of Visits 12    Date for PT Re-Evaluation 07/06/20    Authorization Type FOTO (initial: 52%); progress note at 10th visit; KX modifier at 15th visit    PT Start Time 1115    PT Stop Time 1206    PT Time Calculation (min) 51 min    Activity Tolerance Patient tolerated treatment well    Behavior During Therapy [facility] for tasks assessed/performed           Past Medical History:  Diagnosis Date  . Achilles rupture, left 04/06/2012  . Arthritis of knee   . Cancer ([place])    skin cancer  . Coronary artery disease    X3 STENTS  . Diabetes mellitus without complication ([place])    type 2   . Dysrhythmia    HX of tachycardia - controlled with med  . GERD (gastroesophageal reflux disease)   . Hx of skin cancer, basal cell   . Hyperlipidemia   . Hypertension   . Leg cramps   . Obesity   . Sleep apnea    uses c-pap   . Vertigo     Past Surgical History:  Procedure Laterality Date  . APPENDECTOMY  48 YRS AGO  . COLONOSCOPY    . NM MYOCAR PERF WALL MOTION  05/27/2010   normal  . OPEN REDUCTION INTERNAL FIXATION (ORIF) DISTAL RADIAL FRACTURE Left 01/06/2020   Procedure: OPEN REDUCTION INTERNAL FIXATION (ORIF) DISTAL RADIAL FRACTURE AND REPAIR AS NECESSARY;  Surgeon: Roseanne Kaufman, MD;  Location: [place];  Service: Orthopedics;  Laterality: Left;  Block with IV sed 60 mins  . REVERSE SHOULDER ARTHROPLASTY Left 03/15/2020   Procedure: REVERSE SHOULDER ARTHROPLASTY;  Surgeon: Justice Britain, MD;  [facility];  Service: Orthopedics;  Laterality: Left;  158min  . SKIN CANCER EXCISION    . STENTED CORONARY  ARTERY  03/14/2010   X3 STENTS; tandem proximal and mid LAD  . TOTAL KNEE ARTHROPLASTY Right 08/29/2013   Procedure: RIGHT TOTAL KNEE ARTHROPLASTY;  Surgeon: Gearlean Alf, MD;  Location: [facility];  Service: Orthopedics;  Laterality: Right;  . TOTAL KNEE ARTHROPLASTY Left 08/16/2018   Procedure: LEFT TOTAL KNEE ARTHROPLASTY;  Surgeon: Gaynelle Arabian, MD;  Location: [facility];  Service: Orthopedics;  Laterality: Left;  82min    There were no vitals filed for this visit.   Subjective Assessment - 06/01/20 1220    Subjective COVID-19 screening performed upon arrival. Patient  reports feeling sore all weekend after TEs last visit.    Pertinent History L reverse TSA 03/15/2020, L wrist ORIF; HTN, DM, CAD x3 stents, bilateral TKA    Limitations Lifting;House hold activities    Patient Stated Goals move arm better    Currently in Pain? No/denies    Pain Location Shoulder    Pain Orientation Left    Pain Descriptors / Indicators Sore    Pain Type Surgical pain    Pain Onset 1 to 4 weeks ago    Pain Frequency Intermittent              [facility] PT Assessment - 06/01/20 0001  Assessment   Medical Diagnosis Presence of left artifical shoulder joint    Referring Provider (PT) Jenetta Loges, PA-C    Onset Date/Surgical Date 03/15/20    Hand Dominance Right    Next MD Visit 06/07/2020    Prior Therapy no      Precautions   Precautions Shoulder    Type of Shoulder Precautions follow Dr. Susie Cassette RTSA protocol      ROM / Strength   AROM / PROM / Strength AROM      AROM   Overall AROM  Deficits    AROM Assessment Site Shoulder    Right/Left Shoulder Left    Left Shoulder Flexion 150 Degrees    Left Shoulder ABduction 107 Degrees    Left Shoulder External Rotation 64 Degrees                         [facility] Adult PT Treatment/Exercise - 06/01/20 0001      Exercises   Exercises Shoulder      Shoulder Exercises: Seated   Extension Strengthening;Left;Theraband;15 reps     Theraband Level (Shoulder Extension) Level 1 (Yellow)    Row Strengthening;Left;Theraband;15 reps    Theraband Level (Shoulder Row) Level 1 (Yellow)    Protraction Strengthening;Left;15 reps;Theraband    Theraband Level (Shoulder Protraction) Level 1 (Yellow)    External Rotation Strengthening;Left;Theraband;15 reps    Theraband Level (Shoulder External Rotation) Level 1 (Yellow)    Internal Rotation Strengthening;Left;15 reps;Theraband    Theraband Level (Shoulder Internal Rotation) Level 1 (Yellow)    Flexion Both;AAROM;20 reps    Other Seated Exercises yellow theraband elbow flexion and triceps x15 reps      Shoulder Exercises: Pulleys   Flexion 5 minutes      Modalities   Modalities Vasopneumatic      Vasopneumatic   Number Minutes Vasopneumatic  10 minutes    Vasopnuematic Location  Shoulder    Vasopneumatic Pressure Low    Vasopneumatic Temperature  36 for pain and edema                       PT Long Term Goals - 06/01/20 1139      PT LONG TERM GOAL #1   Title Patient will be independent with HEP    Time 12    Period Weeks      PT LONG TERM GOAL #2   Title Patient will demonstrate 130+ degrees of left shoulder flexion AROM to improve ability to perform functional tasks.    Time 12    Period Weeks    Status Achieved   150     PT LONG TERM GOAL #3   Title Patient will demonstate 60+ degrees of left shoulder ER AROM to improve ability to don/doff apparel.    Time 12    Period Weeks    Status On-going      PT LONG TERM GOAL #4   Title Patient will demonstrate 4/5 or greater left shoulder MMT in all planes to improve stability during functional tasks.    Time 12    Period Days    Status On-going   3+/5     PT LONG TERM GOAL #5   Title Patinet will report ability to independently perform ADLs and household activities with left shoulder pain less than or equal to 2/10.    Time 12    Period Weeks    Status Achieved  Plan -  06/01/20 1200    Clinical Impression Statement Patient responded well to therapy session with resisted TEs. Tactile cuing provided for form and technique especially with resisted ER with yellow theraband. Patient and PT discussed progressing into more functional activities at home to tolerance. Patient reported understanding. No adverse effects upon removal of modalities.    Personal Factors and Comorbidities Comorbidity 2;Age    Comorbidities L reverse TSA 03/15/2020, L wrist ORIF; HTN, DM, CAD x3 stents, bilateral TKA    Examination-Activity Limitations Bathing;Hygiene/Grooming;Dressing;Carry;Reach Overhead;Lift    Stability/Clinical Decision Making Stable/Uncomplicated    Rehab Potential Excellent    PT Frequency 1x / week    PT Duration 12 weeks    PT Treatment/Interventions ADLs/Self Care Home Management;Cryotherapy;Electrical Stimulation;Iontophoresis 4mg /ml Dexamethasone;Moist Heat;Manual techniques;Passive range of motion;Therapeutic activities;Functional mobility training;Therapeutic exercise;Patient/family education;Vasopneumatic Device    PT Next Visit Plan Continue AROM and strengthening per tolerance. Seated best due to balance deficits. modalites PRN for pain relief.    PT Home Exercise Plan see patient education section    Consulted and Agree with Plan of Care Patient;Family member/caregiver    Family Member Consulted Husband           Patient will benefit from skilled therapeutic intervention in order to improve the following deficits and impairments:  Decreased strength, Decreased range of motion, Impaired UE functional use, Pain, Decreased activity tolerance, Postural dysfunction  Visit Diagnosis: Stiffness of left shoulder, not elsewhere classified  Muscle weakness (generalized)  Localized edema     Problem List Patient Active Problem List   Diagnosis Date Noted  . 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 Encounter for orthopedic follow-up care  01/06/2020  . Closed fracture of distal end of left radius 01/02/2020  . Pain in joint of left shoulder 01/02/2020  . Aftercare 10/25/2018  . Pain in left knee 06/18/2018  . Postoperative anemia due to acute blood loss 08/30/2013  . OA (osteoarthritis) of knee 08/29/2013  . Coronary artery disease 03/24/2013  . Obesity (BMI 35.0-39.9 without comorbidity) 03/24/2013  . Essential hypertension 03/24/2013  . Dyslipidemia 03/24/2013    Gabriela Eves, PT, DPT 06/01/2020, 12:24 PM  [facility] [address] [place], [place], [address] Phone: [phone]   Fax:  [phone]  Name: COLLEEN KOTLARZ MRN: [id number] Date of Birth: 24-Mar-1946

## 2020-06-08 ENCOUNTER — Other Ambulatory Visit: Payer: Self-pay

## 2020-06-08 ENCOUNTER — Ambulatory Visit: Payer: Medicare Other | Admitting: Physical Therapy

## 2020-06-08 ENCOUNTER — Encounter: Payer: Self-pay | Admitting: Physical Therapy

## 2020-06-08 DIAGNOSIS — M25612 Stiffness of left shoulder, not elsewhere classified: Secondary | ICD-10-CM | POA: Diagnosis not present

## 2020-06-08 DIAGNOSIS — M25512 Pain in left shoulder: Secondary | ICD-10-CM

## 2020-06-08 DIAGNOSIS — R600 Localized edema: Secondary | ICD-10-CM

## 2020-06-08 DIAGNOSIS — M6281 Muscle weakness (generalized): Secondary | ICD-10-CM

## 2020-06-08 NOTE — Therapy (Signed)
[facility] [place], [place], [address] Phone: [phone]   Fax:  [phone]  Physical Therapy Treatment PHYSICAL THERAPY DISCHARGE SUMMARY  Visits from Start of Care: 9  Current functional level related to goals / functional outcomes: See below   Remaining deficits: See goals   Education / Equipment: HEP Plan: Patient agrees to discharge.  Patient goals were partially met. Patient is being discharged due to meeting the stated rehab goals.  ?????  Gabriela Eves, PT, DPT   Patient Details  Name: Deborah Elliott MRN: [id number] Date of Birth: Mar 09, 1946 Referring Provider (PT): Jenetta Loges, [place]   Encounter Date: 06/08/2020   PT End of Session - 06/08/20 1119    Visit Number 9    Number of Visits 12    Date for PT Re-Evaluation 07/06/20    Authorization Type FOTO (initial: 52%); progress note at 10th visit; KX modifier at 15th visit    PT Start Time 1111    PT Stop Time 1202    PT Time Calculation (min) 51 min    Activity Tolerance Patient tolerated treatment well    Behavior During Therapy [facility] for tasks assessed/performed           Past Medical History:  Diagnosis Date  . Achilles rupture, left 04/06/2012  . Arthritis of knee   . Cancer ([place])    skin cancer  . Coronary artery disease    X3 STENTS  . Diabetes mellitus without complication ([place])    type 2   . Dysrhythmia    HX of tachycardia - controlled with med  . GERD (gastroesophageal reflux disease)   . Hx of skin cancer, basal cell   . Hyperlipidemia   . Hypertension   . Leg cramps   . Obesity   . Sleep apnea    uses c-pap   . Vertigo     Past Surgical History:  Procedure Laterality Date  . APPENDECTOMY  48 YRS AGO  . COLONOSCOPY    . NM MYOCAR PERF WALL MOTION  05/27/2010   normal  . OPEN REDUCTION INTERNAL FIXATION (ORIF) DISTAL RADIAL FRACTURE Left 01/06/2020   Procedure: OPEN REDUCTION INTERNAL FIXATION (ORIF) DISTAL RADIAL  FRACTURE AND REPAIR AS NECESSARY;  Surgeon: Roseanne Kaufman, MD;  Location: [place];  Service: Orthopedics;  Laterality: Left;  Block with IV sed 60 mins  . REVERSE SHOULDER ARTHROPLASTY Left 03/15/2020   Procedure: REVERSE SHOULDER ARTHROPLASTY;  Surgeon: Justice Britain, MD;  Location: [facility];  Service: Orthopedics;  Laterality: Left;  163mn  . SKIN CANCER EXCISION    . STENTED CORONARY ARTERY  03/14/2010   X3 STENTS; tandem proximal and mid LAD  . TOTAL KNEE ARTHROPLASTY Right 08/29/2013   Procedure: RIGHT TOTAL KNEE ARTHROPLASTY;  Surgeon: FGearlean Alf MD;  Location: [facility];  Service: Orthopedics;  Laterality: Right;  . TOTAL KNEE ARTHROPLASTY Left 08/16/2018   Procedure: LEFT TOTAL KNEE ARTHROPLASTY;  Surgeon: AGaynelle Arabian MD;  Location: [facility];  Service: Orthopedics;  Laterality: Left;  573m    There were no vitals filed for this visit.   Subjective Assessment - 06/08/20 1116    Subjective COVID-19 screening performed upon arrival. Patient reports MD appointment went well can DC PT and perform HEP at home.    Pertinent History L reverse TSA 03/15/2020, L wrist ORIF; HTN, DM, CAD x3 stents, bilateral TKA    Limitations Lifting;House hold activities    Patient Stated Goals move arm better  Currently in Pain? No/denies              [facility] PT Assessment - 06/08/20 0001      Assessment   Medical Diagnosis Presence of left artifical shoulder joint    Referring Provider (PT) Jenetta Loges, PA-C    Onset Date/Surgical Date 03/15/20    Hand Dominance Right    Next MD Visit "3 months"     Prior Therapy no      Precautions   Precautions Shoulder    Type of Shoulder Precautions follow Dr. Susie Cassette RTSA protocol      Observation/Other Assessments   Focus on Therapeutic Outcomes (FOTO)  43% limitation      ROM / Strength   AROM / PROM / Strength AROM;Strength      AROM   Overall AROM  Within functional limits for tasks performed    AROM Assessment Site Shoulder     Right/Left Shoulder Left    Left Shoulder Flexion 155 Degrees    Left Shoulder ABduction 136 Degrees    Left Shoulder External Rotation 68 Degrees      Strength   Strength Assessment Site Shoulder    Right/Left Shoulder Left    Left Shoulder Flexion 3+/5    Left Shoulder ABduction 3+/5    Left Shoulder Internal Rotation 3+/5    Left Shoulder External Rotation 3+/5                         [facility] Adult PT Treatment/Exercise - 06/08/20 0001      Exercises   Exercises Shoulder      Shoulder Exercises: Seated   Extension Strengthening;Left;Theraband;20 reps    Theraband Level (Shoulder Extension) Level 2 (Red)    Row Strengthening;Left;Theraband;20 reps    Theraband Level (Shoulder Row) Level 2 (Red)    Protraction Strengthening;Left;Theraband;20 reps    Theraband Level (Shoulder Protraction) Level 2 (Red)    Horizontal ABduction Strengthening;Both;20 reps;Theraband    Theraband Level (Shoulder Horizontal ABduction) Level 1 (Yellow)    External Rotation Strengthening;Left;Theraband;20 reps    Theraband Level (Shoulder External Rotation) Level 2 (Red)    Internal Rotation Strengthening;Left;Theraband;20 reps    Theraband Level (Shoulder Internal Rotation) Level 2 (Red)    Flexion AROM    Other Seated Exercises left bicep curl to fatigue; red theraband tricep curls      Shoulder Exercises: Pulleys   Flexion 5 minutes      Shoulder Exercises: ROM/Strengthening   Other ROM/Strengthening Exercises wall ladder to 28 x2 minutes      Modalities   Modalities Vasopneumatic      Vasopneumatic   Number Minutes Vasopneumatic  10 minutes    Vasopnuematic Location  Shoulder    Vasopneumatic Pressure Low    Vasopneumatic Temperature  36 for pain and edema                       PT Long Term Goals - 06/08/20 1150      PT LONG TERM GOAL #1   Title Patient will be independent with HEP    Time 12    Period Weeks    Status Achieved      PT LONG TERM GOAL #2    Title Patient will demonstrate 130+ degrees of left shoulder flexion AROM to improve ability to perform functional tasks.    Time 12    Period Weeks    Status Achieved      PT  LONG TERM GOAL #3   Title Patient will demonstate 60+ degrees of left shoulder ER AROM to improve ability to don/doff apparel.    Time 12    Period Weeks    Status Achieved      PT LONG TERM GOAL #4   Title Patient will demonstrate 4/5 or greater left shoulder MMT in all planes to improve stability during functional tasks.    Time 12    Period Days    Status Not Met      PT LONG TERM GOAL #5   Title Patinet will report ability to independently perform ADLs and household activities with left shoulder pain less than or equal to 2/10.    Time 12    Period Weeks    Status Achieved                 Plan - 06/08/20 1206    Clinical Impression Statement Patient responded well to therapy session with additional resisted TEs. Patient provided with intermittent tactile cuing for form and technique.  Patient responded fairly well to the progression to red theraband and 1 lb weight with only reports of muscle fatigue. Patient and PT discussed the importance of continuing HEP to maximize strength and educated that strength comes with time. Patient reported understanding. Patient's goals partially met.    Personal Factors and Comorbidities Comorbidity 2;Age    Comorbidities L reverse TSA 03/15/2020, L wrist ORIF; HTN, DM, CAD x3 stents, bilateral TKA    Examination-Activity Limitations Bathing;Hygiene/Grooming;Dressing;Carry;Reach Overhead;Lift    Stability/Clinical Decision Making Stable/Uncomplicated    Clinical Decision Making Low    Rehab Potential Excellent    PT Frequency 1x / week    PT Duration 12 weeks    PT Treatment/Interventions ADLs/Self Care Home Management;Cryotherapy;Electrical Stimulation;Iontophoresis 74m/ml Dexamethasone;Moist Heat;Manual techniques;Passive range of motion;Therapeutic  activities;Functional mobility training;Therapeutic exercise;Patient/family education;Vasopneumatic Device    PT Next Visit Plan DC    PT Home Exercise Plan see patient education section    Consulted and Agree with Plan of Care Patient;Family member/caregiver    Family Member Consulted Husband           Patient will benefit from skilled therapeutic intervention in order to improve the following deficits and impairments:  Decreased strength, Decreased range of motion, Impaired UE functional use, Pain, Decreased activity tolerance, Postural dysfunction  Visit Diagnosis: Stiffness of left shoulder, not elsewhere classified  Muscle weakness (generalized)  Localized edema  Acute pain of left shoulder     Problem List Patient Active Problem List   Diagnosis Date Noted  . 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 Encounter for orthopedic follow-up care 01/06/2020  . Closed fracture of distal end of left radius 01/02/2020  . Pain in joint of left shoulder 01/02/2020  . Aftercare 10/25/2018  . Pain in left knee 06/18/2018  . Postoperative anemia due to acute blood loss 08/30/2013  . OA (osteoarthritis) of knee 08/29/2013  . Coronary artery disease 03/24/2013  . Obesity (BMI 35.0-39.9 without comorbidity) 03/24/2013  . Essential hypertension 03/24/2013  . Dyslipidemia 03/24/2013    KGabriela Eves PT, DPT  06/08/2020, 12:21 PM  [facility] [address] [address]Phone: [phone]  Fax:  [phone] Name: Deborah GREENWALTMRN: [id number] of Birth: [birth year]

## 2020-06-15 ENCOUNTER — Encounter: Payer: Medicare Other | Admitting: Physical Therapy

## 2020-06-22 ENCOUNTER — Encounter: Payer: Medicare Other | Admitting: Physical Therapy

## 2020-08-29 ENCOUNTER — Other Ambulatory Visit: Payer: Self-pay | Admitting: Neurosurgery

## 2020-08-29 ENCOUNTER — Other Ambulatory Visit (HOSPITAL_COMMUNITY): Payer: Self-pay | Admitting: Neurosurgery

## 2020-08-29 DIAGNOSIS — G912 (Idiopathic) normal pressure hydrocephalus: Secondary | ICD-10-CM

## 2020-09-04 ENCOUNTER — Other Ambulatory Visit: Payer: Self-pay | Admitting: Cardiovascular Disease

## 2020-09-07 ENCOUNTER — Other Ambulatory Visit (HOSPITAL_COMMUNITY): Payer: Self-pay | Admitting: Physician Assistant

## 2020-09-17 ENCOUNTER — Ambulatory Visit (HOSPITAL_COMMUNITY)
Admission: RE | Admit: 2020-09-17 | Discharge: 2020-09-17 | Disposition: A | Discharge status: Home / Self Care | Payer: Medicare Other | Source: Ambulatory Visit | Attending: Neurosurgery | Admitting: Neurosurgery

## 2020-09-17 ENCOUNTER — Other Ambulatory Visit: Payer: Self-pay

## 2020-09-17 DIAGNOSIS — G912 (Idiopathic) normal pressure hydrocephalus: Secondary | ICD-10-CM

## 2020-09-17 DIAGNOSIS — R296 Repeated falls: Secondary | ICD-10-CM | POA: Insufficient documentation

## 2020-09-17 LAB — GLUCOSE, CAPILLARY: Glucose-Capillary: 154 mg/dL — ABNORMAL HIGH (ref 70–99)

## 2020-09-17 MED ORDER — ACETAMINOPHEN 325 MG PO TABS: 650.0000 mg | ORAL_TABLET | ORAL | Status: DC | PRN

## 2020-09-17 MED ORDER — LIDOCAINE HCL (PF) 1 % IJ SOLN
5.0000 mL | Freq: Once | INTRAMUSCULAR | Status: AC
  Administered 2020-09-17: 5 mL via INTRADERMAL

## 2020-09-17 NOTE — Progress Notes (Signed)
Physical Therapy in to see patient.

## 2020-09-17 NOTE — Discharge Instructions (Signed)
Lumbar Puncture, Care After This sheet gives you information about how to care for yourself after your procedure. Your health care provider may also give you more specific instructions. If you have problems or questions, contact your health care provider. What can I expect after the procedure? After the procedure, it is common to have:  Mild discomfort or pain at the puncture site.  A mild headache that is relieved with pain medicines. Follow these instructions at home: Activity   Lie down flat or rest for as long as directed by your health care provider.  Return to your normal activities as told by your health care provider. Ask your health care provider what activities are safe for you.  Avoid lifting anything heavier than 10 lb (4.5 kg) for at least 12 hours after the procedure.  Do not drive for 24 hours if you were given a medicine to help you relax (sedative) during your procedure.  Do not drive or use heavy machinery while taking prescription pain medicine. Puncture site care  Remove or change your bandage (dressing) as told by your health care provider.  Check your puncture area every day for signs of infection. Check for: ? More pain. ? Redness or swelling. ? Fluid or blood leaking from the puncture site. ? Warmth. ? Pus or a bad smell. General instructions  Take over-the-counter and prescription medicines only as told by your health care provider.  Drink enough fluids to keep your urine clear or pale yellow. Your health care provider may recommend drinking caffeine to prevent a headache.  Keep all follow-up visits as told by your health care provider. This is important. Contact a health care provider if:  You have fever or chills.  You have nausea or vomiting.  You have a headache that lasts for more than 2 days or does not get better with medicine. Get help right away if:  You develop any of the following in your  legs: ? Weakness. ? Numbness. ? Tingling.  You are unable to control when you urinate or have a bowel movement (incontinence).  You have signs of infection around your puncture site, such as: ? More pain. ? Redness or swelling. ? Fluid or blood leakage. ? Warmth. ? Pus or a bad smell.  You are dizzy or you feel like you might faint.  You have a severe headache, especially when you sit or stand. Summary  A lumbar puncture is a procedure in which a small needle is inserted into the lower back to remove fluid that surrounds the brain and spinal cord.  After this procedure, it is common to have a headache and pain around the needle insertion area.  Lying flat, staying hydrated, and drinking caffeine can help prevent headaches.  Monitor your needle insertion site for signs of infection, including warmth, fluid, or more pain.  Get help right away if you develop leg weakness, leg numbness, incontinence, or severe headaches. This information is not intended to replace advice given to you by your health care provider. Make sure you discuss any questions you have with your health care provider. Document Revised: 11/19/2016 Document Reviewed: 11/19/2016 Elsevier Patient Education  2020 Elsevier Inc.  

## 2020-09-20 ENCOUNTER — Other Ambulatory Visit: Payer: Self-pay | Admitting: Cardiovascular Disease

## 2020-09-28 ENCOUNTER — Other Ambulatory Visit: Payer: Self-pay | Admitting: Cardiovascular Disease

## 2020-10-28 IMAGING — DX DG HUMERUS 2V *L*
2 series · 2 of 2 positions shown · non-contrast
Comparison: None.

CLINICAL DATA: Fall

EXAM:
LEFT HUMERUS - 2+ VIEW

[humerus ap]
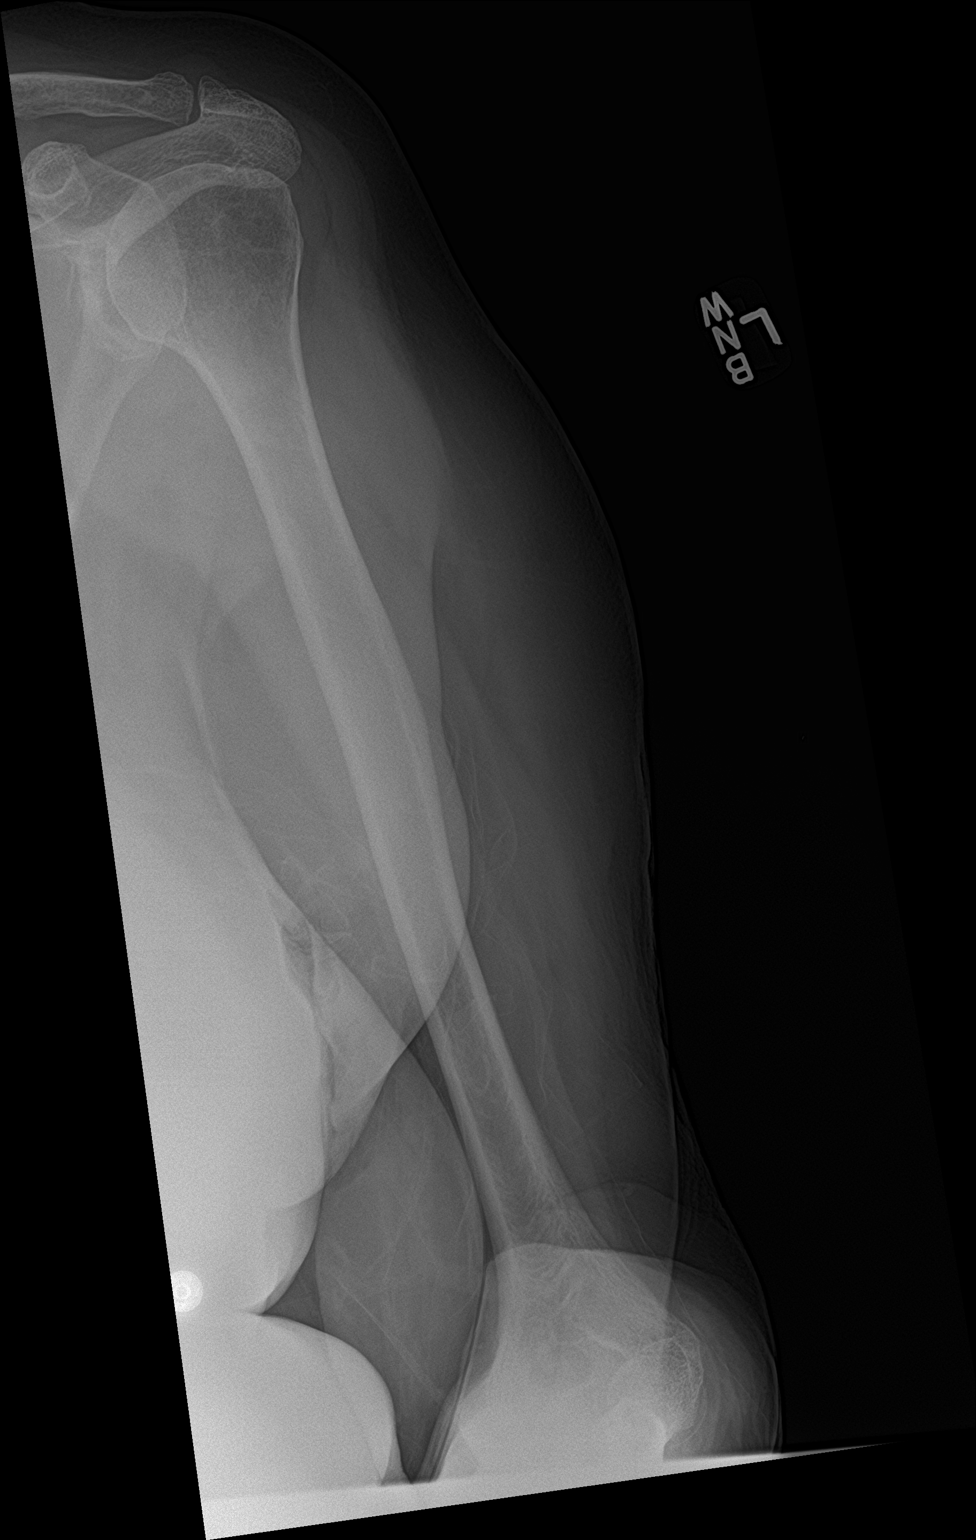

[humerus lat]
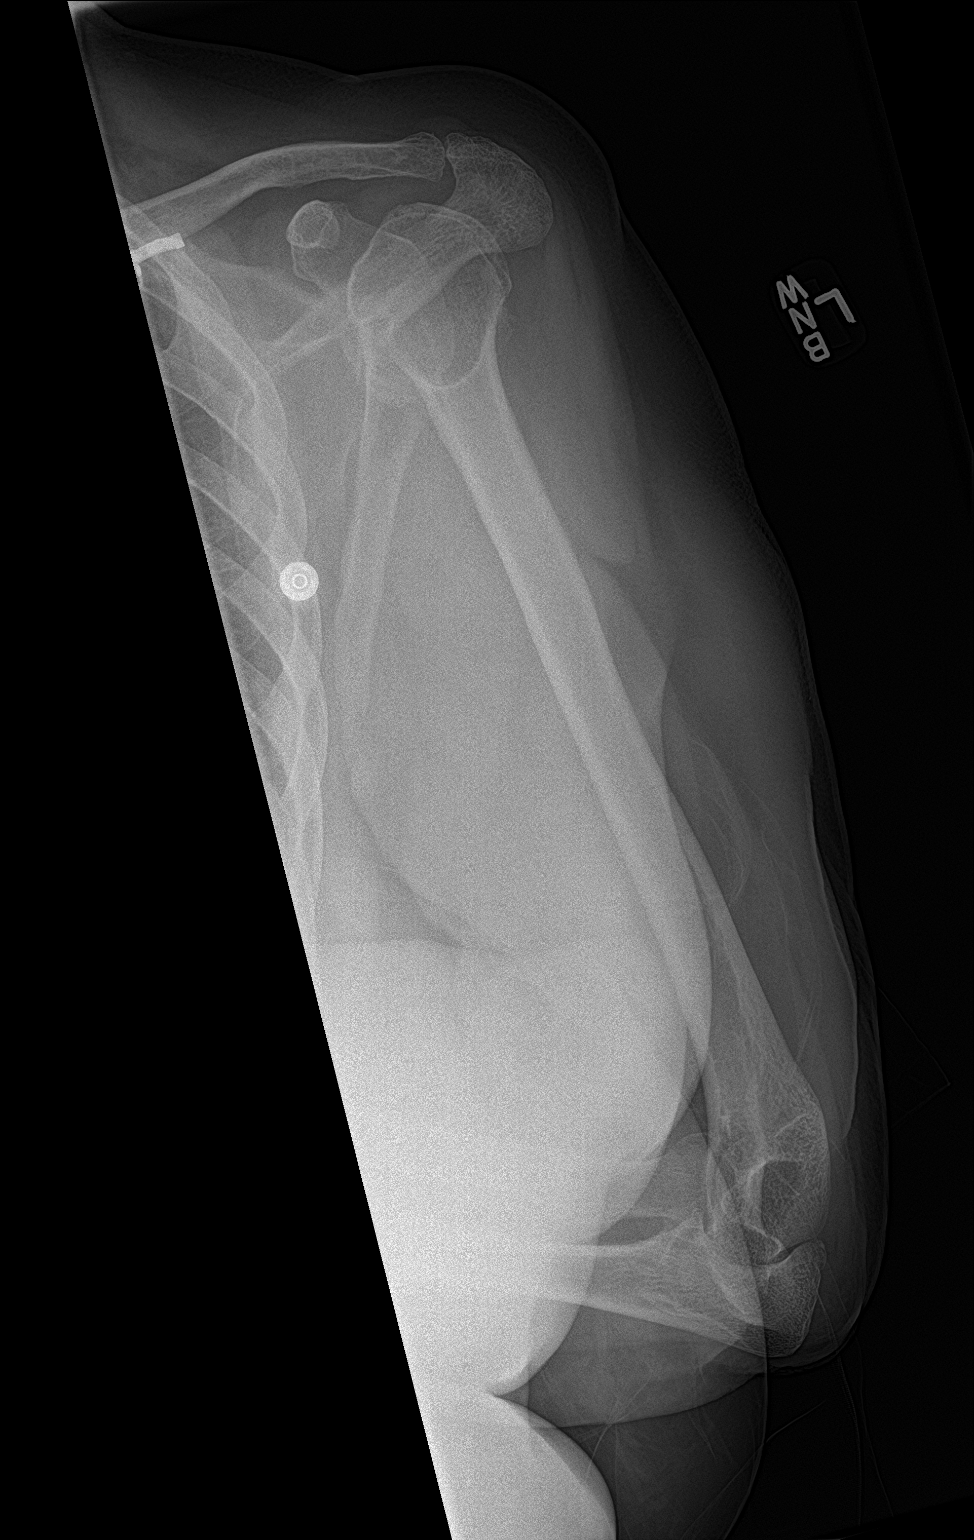

[2 of 2 positions shown; findings below may reference images not displayed]

FINDINGS: There is no evidence of fracture or other focal bone lesions. Soft
tissues are unremarkable.
IMPRESSION: Negative.

## 2020-10-28 IMAGING — DX DG WRIST COMPLETE 3+V*L*
3 series · 3 of 3 positions shown · non-contrast
Comparison: 9486 hours today.

CLINICAL DATA: 73-year-old female post reduction left wrist
fracture.

EXAM:
LEFT WRIST - COMPLETE 3+ VIEW

[wrist ap]
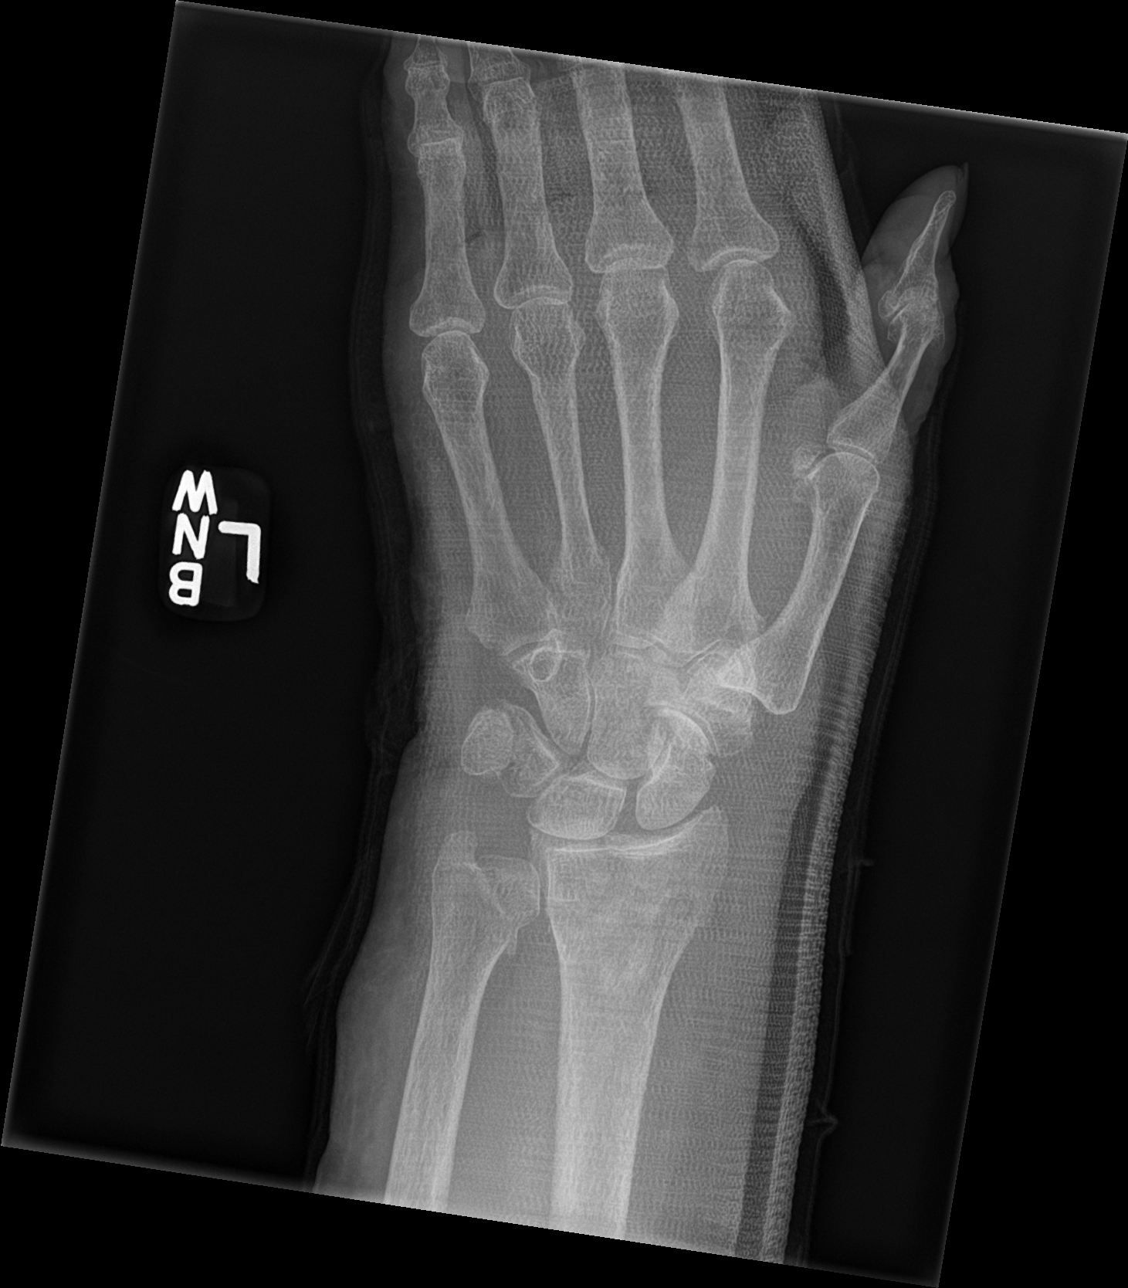

[wrist obl]
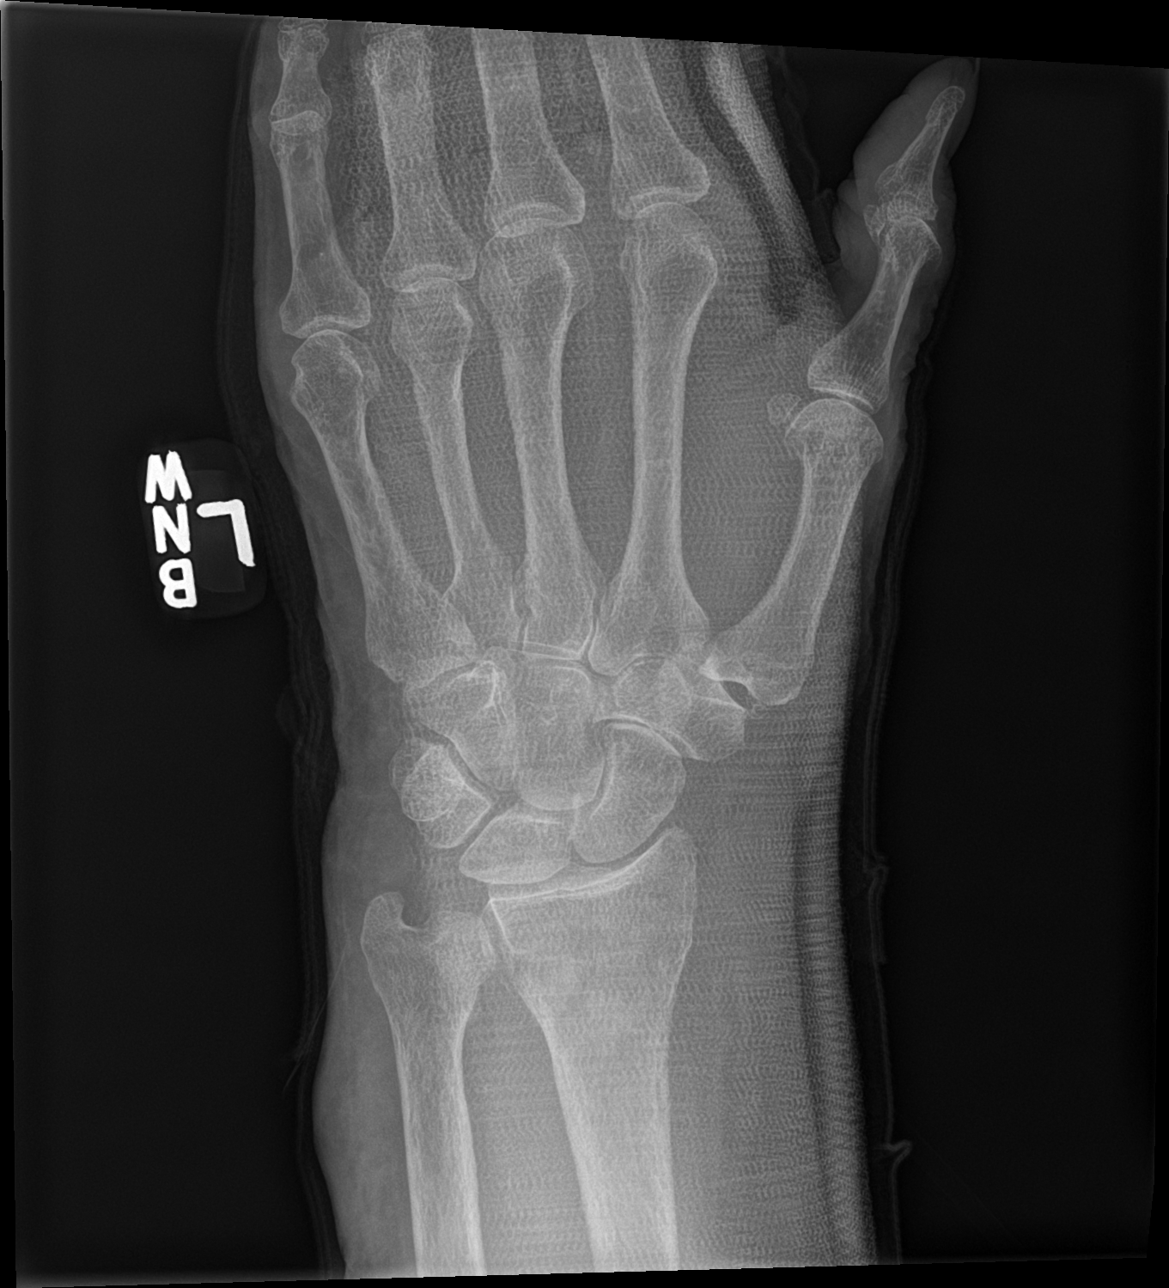

[wrist tunnel]
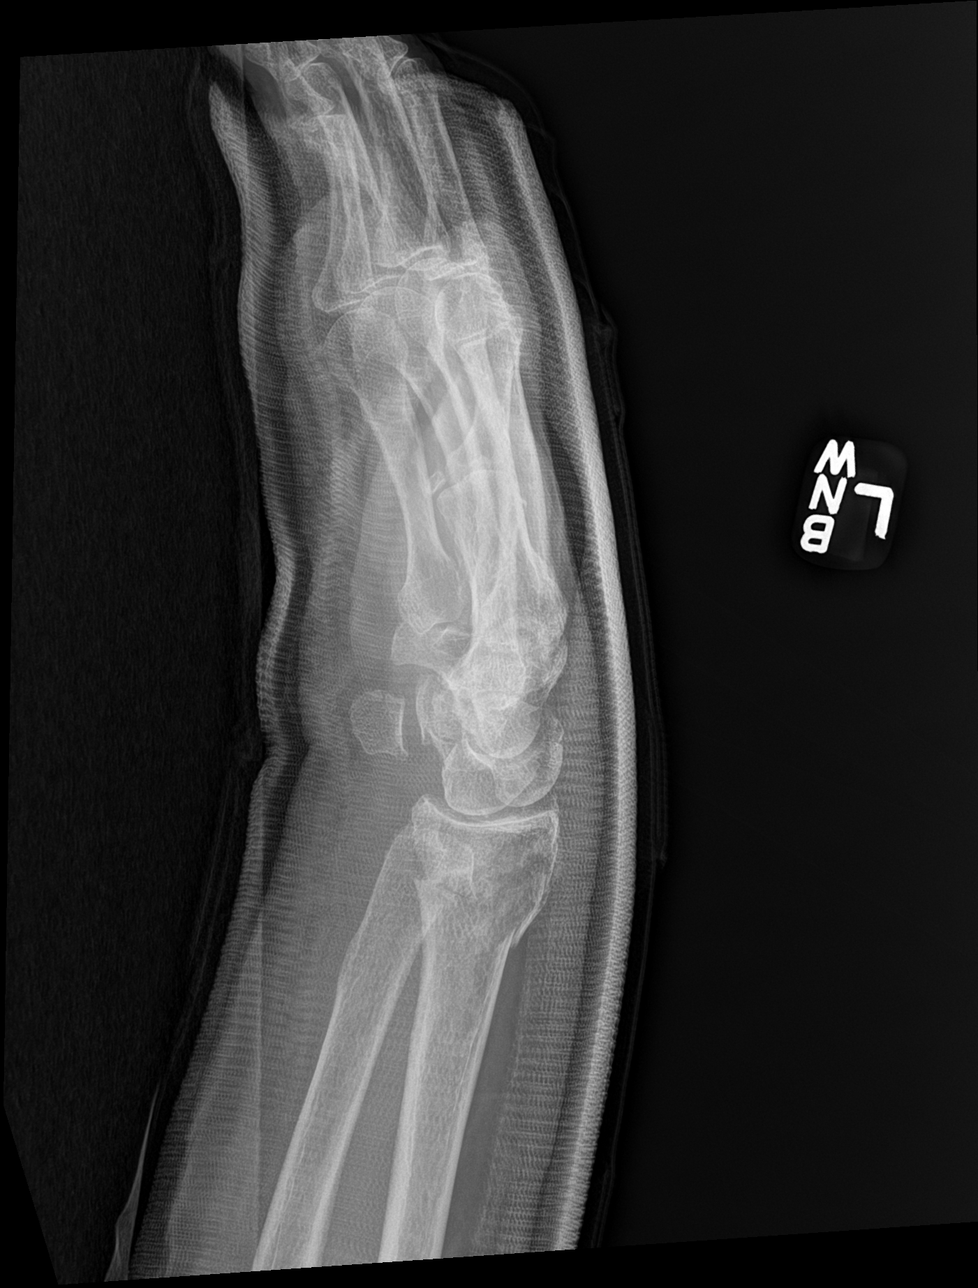

[3 of 3 positions shown; findings below may reference images not displayed]

FINDINGS: Cast or splint material now projects about the left hand and
forearm. Substantially improved alignment at the comminuted distal
left radius fracture, largely resolved dorsal angulation. Slight
dorsal displacement. Suspicion of associated acute but relatively
nondisplaced fracture of the distal ulna. Stable carpal bones.
IMPRESSION: 1. Cast or splint material placed with improved alignment at the
distal left radius fracture, largely resolved dorsal angulation.
2. Suspicion of nondisplaced acute distal ulna fracture.

## 2020-10-28 IMAGING — DX DG FOREARM 2V*L*
2 series · 2 of 2 positions shown · non-contrast
Comparison: None.

CLINICAL DATA: Fall with pain

EXAM:
LEFT FOREARM - 2 VIEW

[forearm ap]
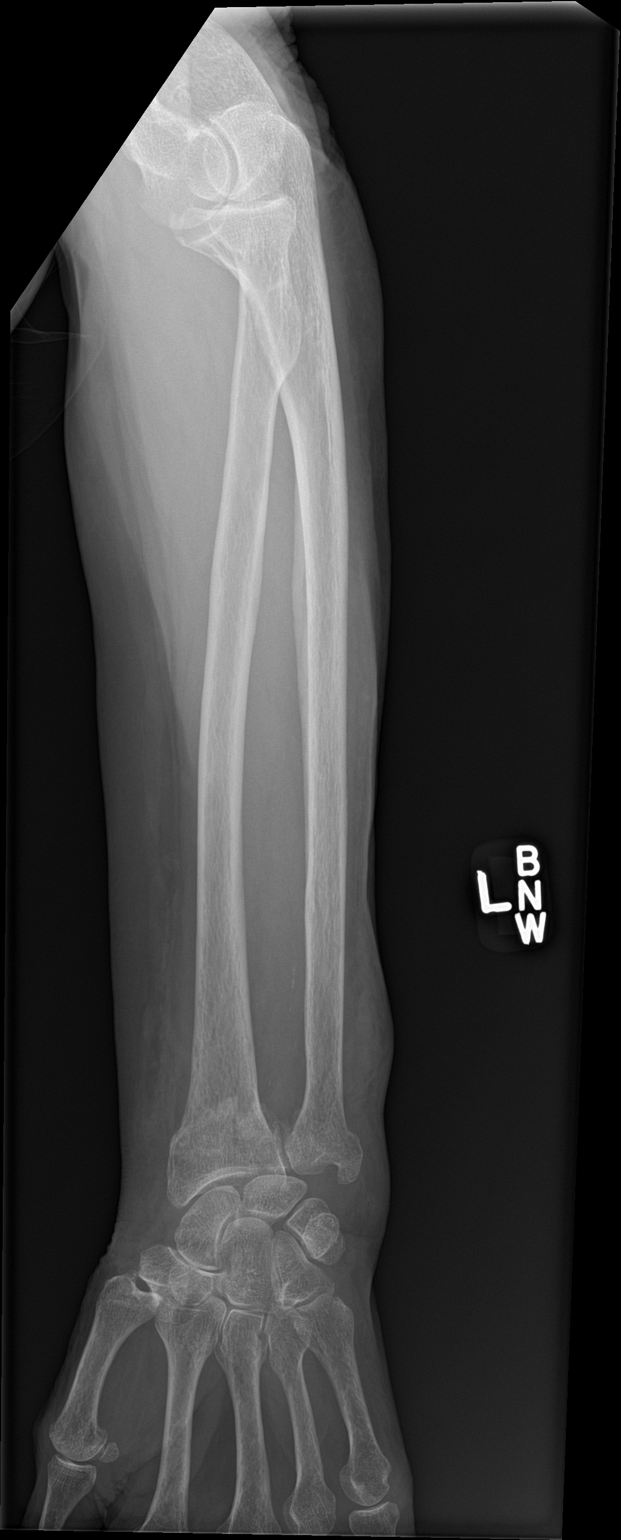

[forearm lat]
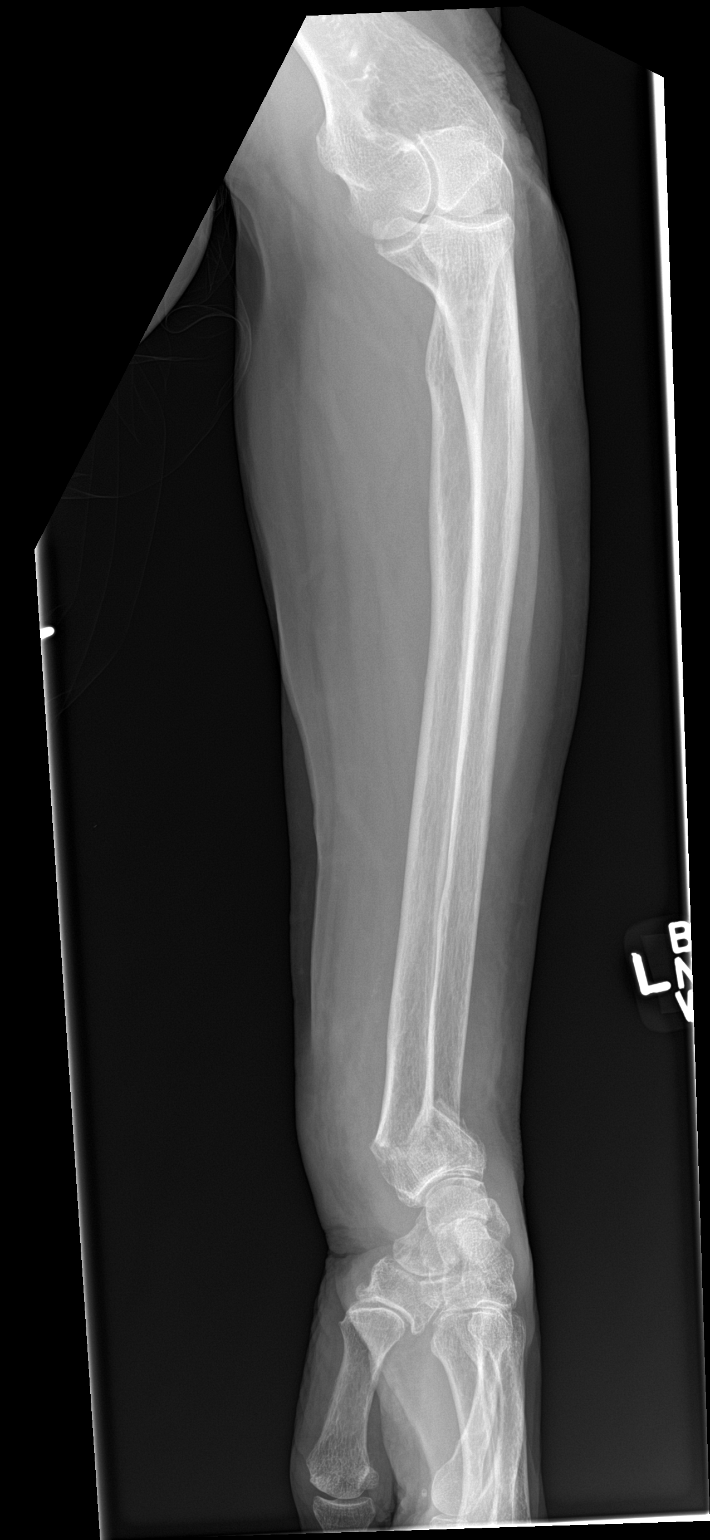

[2 of 2 positions shown; findings below may reference images not displayed]

FINDINGS: Acute comminuted intra-articular distal radius fracture with dorsal
angulation of distal fracture fragment and about [DATE] shaft diameter
of dorsal displacement. Acute nondisplaced ulnar styloid process
fracture. Soft tissue swelling is present.
IMPRESSION: 1. Acute displaced and angulated intra-articular distal radius
fracture
2. Acute nondisplaced ulnar styloid process fracture

## 2021-03-05 ENCOUNTER — Other Ambulatory Visit: Payer: Self-pay

## 2021-03-05 ENCOUNTER — Encounter: Payer: Self-pay | Admitting: Cardiovascular Disease

## 2021-03-05 ENCOUNTER — Ambulatory Visit: Payer: Medicare Other | Admitting: Cardiovascular Disease

## 2021-03-05 VITALS — BP 144/76 | HR 66 | Ht 66.0 in | Wt 227.0 lb

## 2021-03-05 DIAGNOSIS — E785 Hyperlipidemia, unspecified: Secondary | ICD-10-CM

## 2021-03-05 DIAGNOSIS — G4733 Obstructive sleep apnea (adult) (pediatric): Secondary | ICD-10-CM

## 2021-03-05 DIAGNOSIS — I2510 Atherosclerotic heart disease of native coronary artery without angina pectoris: Secondary | ICD-10-CM | POA: Diagnosis not present

## 2021-03-05 NOTE — Progress Notes (Signed)
03/05/2021 Deborah Elliott   22-Jun-1946  [id number]  Primary Physician Burnard Bunting, MD Primary Cardiologist: Lorretta Harp MD Deborah Elliott, [place]  HPI:  Deborah Elliott is a 75 y.o.  moderately overweight, married Caucasian female, mother of 2, grandmother to 3 grandchildren (1 deceased,) a patient of Dr. 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 I saw herin the office5/18/2021. She is accompanied by her husband Deborah Elliott today.. She has obstructive sleep apnea on CPAP which she benefits from for which she sees Dr. Ellouise Elliott. She has a history of CAD status post PCI and stenting of her LAD in 3 places by myself with Taxus drug-eluting stents back on Mar 14, 2010, after being admitted with unstable angina. She had a normal EF. She did have a 50% to 60% mid RCA lesion that was treated medically. Her other problems include hypertension and hyperlipidemia. She is otherwise asymptomatic.Dr. Reynaldo Elliott follows her lipid profile closely.  She has had bilateral total knee replacements and left wrist surgery by Dr. Amedeo Plenty.  She also underwent left shoulder surgery by Dr. Onnie Graham.  She currently walks with a cane because of instability.  Since I saw her a year ago she remained stable.  She denies chest pain.  She has chronic dyspnea which I suspect is multifactorial.   Current Meds  Medication Sig  . acetaminophen (TYLENOL) 500 MG tablet Take 500 mg by mouth daily as needed (pain).   . Apoaequorin (PREVAGEN PO) Take 1 tablet by mouth daily.  . clopidogrel (PLAVIX) 75 MG tablet TAKE 1 TABLET DAILY  . cyclobenzaprine (FLEXERIL) 10 MG tablet Take 1 tablet (10 mg total) by mouth 3 (three) times daily as needed for muscle spasms.  Marland Kitchen esomeprazole (NEXIUM) 20 MG capsule Take 20 mg by mouth in the morning.  . fosinopril (MONOPRIL) 20 MG tablet Take 20 mg by mouth in the morning.   . furosemide (LASIX) 40 MG tablet Take 20 mg by mouth in the morning.   Marland Kitchen GNP CO Q10 200 MG capsule TAKE (1) CAPSULE  DAILY  . meclizine (ANTIVERT) 25 MG tablet Take 25 mg by mouth 2 (two) times daily as needed for dizziness.   . metFORMIN (GLUCOPHAGE) 500 MG tablet Take 500 mg by mouth 2 (two) times daily.  . methocarbamol (ROBAXIN) 500 MG tablet Take 500 mg by mouth every 6 (six) hours as needed for muscle spasms (6 to 8 hours).  . metoprolol succinate (TOPROL-XL) 50 MG 24 hr tablet Take 50 mg by mouth in the morning. Take with or immediately following a meal.  . ondansetron (ZOFRAN) 4 MG tablet Take 1 tablet (4 mg total) by mouth every 8 (eight) hours as needed for nausea or vomiting.  . potassium chloride SA (K-DUR,KLOR-CON) 20 MEQ tablet Take 20 mEq by mouth in the morning.   . rosuvastatin (CRESTOR) 40 MG tablet TAKE 1 TABLET ONCE DAILY IN THE EVENING     Allergies  Allergen Reactions  . Morphine And Related Other (See Comments)    Reaction unknown per patient  . Elemental Sulfur Itching and Rash  . Latex Rash    Blood blisters  . Tape Rash    Social History   Socioeconomic History  . Marital status: Married    Spouse name: Not on file  . Number of children: Not on file  . Years of education: Not on file  . Highest education level: Not on file  Occupational History  . Not on file  Tobacco  Use  . Smoking status: Former Smoker    Quit date: 08/23/1970    Years since quitting: 50.5  . Smokeless tobacco: Never Used  Vaping Use  . Vaping Use: Never used  Substance and Sexual Activity  . Alcohol use: No  . Drug use: No  . Sexual activity: Not Currently    Birth control/protection: Post-menopausal  Other Topics Concern  . Not on file  Social History Narrative  . Not on file   Social Determinants of Health   Financial Resource Strain: Not on file  Food Insecurity: Not on file  Transportation Needs: Not on file  Physical Activity: Not on file  Stress: Not on file  Social Connections: Not on file  Intimate Partner Violence: Not on file     Review of Systems: General: negative  for chills, fever, night sweats or weight changes.  Cardiovascular: negative for chest pain, dyspnea on exertion, edema, orthopnea, palpitations, paroxysmal nocturnal dyspnea or shortness of breath Dermatological: negative for rash Respiratory: negative for cough or wheezing Urologic: negative for hematuria Abdominal: negative for nausea, vomiting, diarrhea, bright red blood per rectum, melena, or hematemesis Neurologic: negative for visual changes, syncope, or dizziness All other systems reviewed and are otherwise negative except as noted above.    Blood pressure 140/90, pulse 66, height 5\' 6"  (1.676 m), weight 227 lb (103 kg), SpO2 95 %.  General appearance: alert and no distress Neck: no adenopathy, no carotid bruit, no JVD, supple, symmetrical, trachea midline and thyroid not enlarged, symmetric, no tenderness/mass/nodules Lungs: clear to auscultation bilaterally Heart: regular rate and rhythm, S1, S2 normal, no murmur, click, rub or gallop Extremities: extremities normal, atraumatic, no cyanosis or edema Pulses: 2+ and symmetric Skin: Skin color, texture, turgor normal. No rashes or lesions Neurologic: Alert and oriented X 3, normal strength and tone. Normal symmetric reflexes. Normal coordination and gait  EKG sinus rhythm at 66 with right bundle branch block and anterolateral Q waves with low limb voltage.  I personally reviewed this EKG.  ASSESSMENT AND PLAN:   Coronary artery disease History of CAD status post PCI and stenting of her LAD in 3 different places by myself with Taxus drug-eluting stents 03/14/2010 after being mated with unstable angina.  Her EF was normal.  She did have a 50 to 60% mid RCA lesion that was treated medically at that time.  She denies chest pain.  Essential hypertension History of essential hypertension blood pressure measured today 140/90.  She is on Monopril and metoprolol.  Dyslipidemia History of dyslipidemia on statin therapy with lipid profile  performed 05/08/2020 revealing total cholesterol of 153, LDL 79 and HDL 43.  We will repeat a fasting lipid and liver profile.  Obstructive sleep apnea History of obstructive sleep apnea on CPAP.      Lorretta Harp MD FACP,FACC,FAHA, [facility] 03/05/2021 2:27 PM

## 2021-03-05 NOTE — Assessment & Plan Note (Signed)
History of obstructive sleep apnea on CPAP. 

## 2021-03-05 NOTE — Assessment & Plan Note (Signed)
History of dyslipidemia on statin therapy with lipid profile performed 05/08/2020 revealing total cholesterol of 153, LDL 79 and HDL 43.  We will repeat a fasting lipid and liver profile.

## 2021-03-05 NOTE — Patient Instructions (Signed)
Medication Instructions:  Your physician recommends that you continue on your current medications as directed. Please refer to the Current Medication list given to you today.  *If you need a refill on your cardiac medications before your next appointment, please call your pharmacy*   Lab Work: Your physician recommends that you return for lab work in: next week or two for fasting lipid/liver profile.  If you have labs (blood work) drawn today and your tests are completely normal, you will receive your results only by: Marland Kitchen MyChart Message (if you have MyChart) OR . A paper copy in the mail If you have any lab test that is abnormal or we need to change your treatment, we will call you to review the results.   Follow-Up: At [facility], you and your health needs are our priority.  As part of our continuing mission to provide you with exceptional heart care, we have created designated Provider Care Teams.  These Care Teams include your primary Cardiologist (physician) and Advanced Practice Providers (APPs -  Physician Assistants and Nurse Practitioners) who all work together to provide you with the care you need, when you need it.  We recommend signing up for the patient portal called "MyChart".  Sign up information is provided on this After Visit Summary.  MyChart is used to connect with patients for Virtual Visits (Telemedicine).  Patients are able to view lab/test results, encounter notes, upcoming appointments, etc.  Non-urgent messages can be sent to your provider as well.   To learn more about what you can do with MyChart, go to [email or web address].    Your next appointment:   12 month(s)  The format for your next appointment:   In Person  Provider:   Quay Burow, MD

## 2021-03-05 NOTE — Assessment & Plan Note (Signed)
History of CAD status post PCI and stenting of her LAD in 3 different places by myself with Taxus drug-eluting stents 03/14/2010 after being mated with unstable angina.  Her EF was normal.  She did have a 50 to 60% mid RCA lesion that was treated medically at that time.  She denies chest pain.

## 2021-03-05 NOTE — Assessment & Plan Note (Signed)
History of essential hypertension blood pressure measured today 140/90.  She is on Monopril and metoprolol.

## 2021-03-21 LAB — HEPATIC FUNCTION PANEL
ALT: 10 IU/L (ref 0–32)
AST: 18 IU/L (ref 0–40)
Albumin: 4.2 g/dL (ref 3.7–4.7)
Alkaline Phosphatase: 60 IU/L (ref 44–121)
Bilirubin Total: 0.6 mg/dL (ref 0.0–1.2)
Bilirubin, Direct: 0.19 mg/dL (ref 0.00–0.40)
Total Protein: 6.2 g/dL (ref 6.0–8.5)

## 2021-03-21 LAB — LIPID PANEL
Chol/HDL Ratio: 3.6 ratio (ref 0.0–4.4)
Cholesterol, Total: 152 mg/dL (ref 100–199)
HDL: 42 mg/dL (ref >39)
LDL Chol Calc (NIH): 86 mg/dL (ref 0–99)
Triglycerides: 136 mg/dL (ref 0–149)
VLDL Cholesterol Cal: 24 mg/dL (ref 5–40)

## 2021-03-27 ENCOUNTER — Other Ambulatory Visit: Payer: Self-pay

## 2021-03-27 DIAGNOSIS — E785 Hyperlipidemia, unspecified: Secondary | ICD-10-CM

## 2021-03-27 MED ORDER — EZETIMIBE 10 MG PO TABS: 10.0000 mg | ORAL_TABLET | Freq: Every day | ORAL | 3 refills | Status: DC

## 2021-03-27 NOTE — Progress Notes (Signed)
Letter with cholesterol information sent to patient

## 2021-03-27 NOTE — Patient Instructions (Addendum)
Preventing High Cholesterol Cholesterol is a white, waxy substance similar to fat that the human body needs to help build cells. The liver makes all the cholesterol that a person's body needs. Having high cholesterol (hypercholesterolemia) increases your risk for heart disease and stroke. Extra or excess cholesterol comes from the food that you eat. High cholesterol can often be prevented with diet and lifestyle changes. If you already have high cholesterol, you can control it with diet, lifestyle changes, and medicines. How can high cholesterol affect me? If you have high cholesterol, fatty deposits (plaques) may build up on the walls of your blood vessels. The blood vessels that carry blood away from your heart are called arteries. Plaques make the arteries narrower and stiffer. This in turn can:  Restrict or block blood flow and cause blood clots to form.  Increase your risk for heart attack and stroke. What can increase my risk for high cholesterol? This condition is more likely to develop in people who:  Eat foods that are high in saturated fat or cholesterol. Saturated fat is mostly found in foods that come from animal sources.  Are overweight.  Are not getting enough exercise.  Have a family history of high cholesterol (familial hypercholesterolemia). What actions can I take to prevent this? Nutrition  Eat less saturated fat.  Avoid trans fats (partially hydrogenated oils). These are often found in margarine and in some baked goods, fried foods, and snacks bought in packages.  Avoid precooked or cured meat, such as bacon, sausages, or meat loaves.  Avoid foods and drinks that have added sugars.  Eat more fruits, vegetables, and whole grains.  Choose healthy sources of protein, such as fish, poultry, lean cuts of red meat, beans, peas, lentils, and nuts.  Choose healthy sources of fat, such as: ? Nuts. ? Vegetable oils, especially olive oil. ? Fish that have healthy fats,  such as omega-3 fatty acids. These fish include mackerel or salmon.   Lifestyle  Lose weight if you are overweight. Maintaining a healthy body mass index (BMI) can help prevent or control high cholesterol. It can also lower your risk for diabetes and high blood pressure. Ask your health care provider to help you with a diet and exercise plan to lose weight safely.  Do not use any products that contain nicotine or tobacco, such as cigarettes, e-cigarettes, and chewing tobacco. If you need help quitting, ask your health care provider. Alcohol use  Do not drink alcohol if: ? Your health care provider tells you not to drink. ? You are pregnant, may be pregnant, or are planning to become pregnant.  If you drink alcohol: ? Limit how much you use to:  0-1 drink a day for women.  0-2 drinks a day for men. ? Be aware of how much alcohol is in your drink. In the [place] one drink equals one 12 oz bottle of beer (355 mL), one 5 oz glass of wine (148 mL), or one 1 oz glass of hard liquor (44 mL). Activity  Get enough exercise. Do exercises as told by your health care provider.  Each week, do at least 150 minutes of exercise that takes a medium level of effort (moderate-intensity exercise). This kind of exercise: ? Makes your heart beat faster while allowing you to still be able to talk. ? Can be done in short sessions several times a day or longer sessions a few times a week. For example, on 5 days each week, you could walk fast or ride   your bike 3 times a day for 10 minutes each time.   Medicines  Your health care provider may recommend medicines to help lower cholesterol. This may be a medicine to lower the amount of cholesterol that your liver makes. You may need medicine if: ? Diet and lifestyle changes have not lowered your cholesterol enough. ? You have high cholesterol and other risk factors for heart disease or stroke.  Take over-the-counter and prescription medicines only as told by your  health care provider. General information  Manage your risk factors for high cholesterol. Talk with your health care provider about all your risk factors and how to lower your risk.  Manage other conditions that you have, such as diabetes or high blood pressure (hypertension).  Have blood tests to check your cholesterol levels at regular points in time as told by your health care provider.  Keep all follow-up visits as told by your health care provider. This is important. Where to find more information  American Heart Association: www.heart.org  National Heart, Lung, and Blood Institute: www.nhlbi.nih.gov Summary  High cholesterol increases your risk for heart disease and stroke. By keeping your cholesterol level low, you can reduce your risk for these conditions.  High cholesterol can often be prevented with diet and lifestyle changes.  Work with your health care provider to manage your risk factors, and have your blood tested regularly. This information is not intended to replace advice given to you by your health care provider. Make sure you discuss any questions you have with your health care provider. Document Revised: 07/19/2019 Document Reviewed: 07/19/2019 Elsevier Patient Education  2021 Elsevier Inc.   Cholesterol Content in Foods Cholesterol is a waxy, fat-like substance that helps to carry fat in the blood. The body needs cholesterol in small amounts, but too much cholesterol can cause damage to the arteries and heart. Most people should eat less than 200 milligrams (mg) of cholesterol a day. Foods with cholesterol Cholesterol is found in animal-based foods, such as meat, seafood, and dairy. Generally, low-fat dairy and lean meats have less cholesterol than full-fat dairy and fatty meats. The milligrams of cholesterol per serving (mg per serving) of common cholesterol-containing foods are listed below. Meat and other proteins  Egg -- one large whole egg has 186  mg.  Veal shank -- 4 oz has 141 mg.  Lean ground turkey (93% lean) -- 4 oz has 118 mg.  Fat-trimmed lamb loin -- 4 oz has 106 mg.  Lean ground beef (90% lean) -- 4 oz has 100 mg.  Lobster -- 3.5 oz has 90 mg.  Pork loin chops -- 4 oz has 86 mg.  Canned salmon -- 3.5 oz has 83 mg.  Fat-trimmed beef top loin -- 4 oz has 78 mg.  Frankfurter -- 1 frank (3.5 oz) has 77 mg.  Crab -- 3.5 oz has 71 mg.  Roasted chicken without skin, white meat -- 4 oz has 66 mg.  Light bologna -- 2 oz has 45 mg.  Deli-cut turkey -- 2 oz has 31 mg.  Canned tuna -- 3.5 oz has 31 mg.  Bacon -- 1 oz has 29 mg.  Oysters and mussels (raw) -- 3.5 oz has 25 mg.  Mackerel -- 1 oz has 22 mg.  Trout -- 1 oz has 20 mg.  Pork sausage -- 1 link (1 oz) has 17 mg.  Salmon -- 1 oz has 16 mg.  Tilapia -- 1 oz has 14 mg. Dairy  Soft-serve ice cream --    cup (4 oz) has 103 mg.  Whole-milk yogurt -- 1 cup (8 oz) has 29 mg.  Cheddar cheese -- 1 oz has 28 mg.  American cheese -- 1 oz has 28 mg.  Whole milk -- 1 cup (8 oz) has 23 mg.  2% milk -- 1 cup (8 oz) has 18 mg.  Cream cheese -- 1 tablespoon (Tbsp) has 15 mg.  Cottage cheese --  cup (4 oz) has 14 mg.  Low-fat (1%) milk -- 1 cup (8 oz) has 10 mg.  Sour cream -- 1 Tbsp has 8.5 mg.  Low-fat yogurt -- 1 cup (8 oz) has 8 mg.  Nonfat Greek yogurt -- 1 cup (8 oz) has 7 mg.  Half-and-half cream -- 1 Tbsp has 5 mg. Fats and oils  Cod liver oil -- 1 tablespoon (Tbsp) has 82 mg.  Butter -- 1 Tbsp has 15 mg.  Lard -- 1 Tbsp has 14 mg.  Bacon grease -- 1 Tbsp has 14 mg.  Mayonnaise -- 1 Tbsp has 5-10 mg.  Margarine -- 1 Tbsp has 3-10 mg. Exact amounts of cholesterol in these foods may vary depending on specific ingredients and brands.   Foods without cholesterol Most plant-based foods do not have cholesterol unless you combine them with a food that has cholesterol. Foods without cholesterol include:  Grains and  cereals.  Vegetables.  Fruits.  Vegetable oils, such as olive, canola, and sunflower oil.  Legumes, such as peas, beans, and lentils.  Nuts and seeds.  Egg whites.   Summary  The body needs cholesterol in small amounts, but too much cholesterol can cause damage to the arteries and heart.  Most people should eat less than 200 milligrams (mg) of cholesterol a day. This information is not intended to replace advice given to you by your health care provider. Make sure you discuss any questions you have with your health care provider. Document Revised: 02/27/2020 Document Reviewed: 02/27/2020 Elsevier Patient Education  2021 Elsevier Inc.   

## 2021-07-08 ENCOUNTER — Other Ambulatory Visit: Payer: Self-pay | Admitting: Cardiovascular Disease

## 2021-10-08 ENCOUNTER — Other Ambulatory Visit: Payer: Self-pay | Admitting: Cardiovascular Disease

## 2021-11-04 ENCOUNTER — Other Ambulatory Visit: Payer: Self-pay | Admitting: Cardiovascular Disease

## 2022-04-04 ENCOUNTER — Other Ambulatory Visit: Payer: Self-pay | Admitting: Cardiovascular Disease

## 2022-04-04 DIAGNOSIS — E785 Hyperlipidemia, unspecified: Secondary | ICD-10-CM

## 2022-04-10 ENCOUNTER — Encounter: Payer: Self-pay | Admitting: Cardiovascular Disease

## 2022-04-10 ENCOUNTER — Ambulatory Visit: Payer: Medicare Other | Admitting: Cardiovascular Disease

## 2022-04-10 VITALS — BP 142/84 | HR 92 | Ht 65.0 in | Wt 226.0 lb

## 2022-04-10 DIAGNOSIS — I10 Essential (primary) hypertension: Secondary | ICD-10-CM | POA: Diagnosis not present

## 2022-04-10 DIAGNOSIS — R079 Chest pain, unspecified: Secondary | ICD-10-CM

## 2022-04-10 DIAGNOSIS — I2510 Atherosclerotic heart disease of native coronary artery without angina pectoris: Secondary | ICD-10-CM

## 2022-04-10 DIAGNOSIS — R0602 Shortness of breath: Secondary | ICD-10-CM

## 2022-04-10 DIAGNOSIS — E785 Hyperlipidemia, unspecified: Secondary | ICD-10-CM

## 2022-04-10 DIAGNOSIS — G4733 Obstructive sleep apnea (adult) (pediatric): Secondary | ICD-10-CM

## 2022-04-10 NOTE — Assessment & Plan Note (Signed)
History of essential hypertension a blood pressure measured today at 142/84.  She is on Monopril and metoprolol.

## 2022-04-10 NOTE — Patient Instructions (Signed)
Medication Instructions:  Your physician recommends that you continue on your current medications as directed. Please refer to the Current Medication list given to you today.  *If you need a refill on your cardiac medications before your next appointment, please call your pharmacy*   Lab Work: None If you have labs (blood work) drawn today and your tests are completely normal, you will receive your results only by: [place] (if you have MyChart) OR A paper copy in the mail If you have any lab test that is abnormal or we need to change your treatment, we will call you to review the results.   Testing/Procedures: Your physician has requested that you have an echocardiogram. Echocardiography is a painless test that uses sound waves to create images of your heart. It provides your doctor with information about the size and shape of your heart and how well your heart's chambers and valves are working. This procedure takes approximately one hour. There are no restrictions for this procedure.  Your physician has requested that you have a lexiscan myoview. For further information please visit [email or web address]. Please follow instruction sheet, as given.   The test will take approximately 3 to 4 hours to complete; you may bring reading material.  If someone comes with you to your appointment, they will need to remain in the main lobby due to limited space in the testing area. **If you are pregnant or breastfeeding, please notify the nuclear lab prior to your appointment**  How to prepare for your Myocardial Perfusion Test: Do not eat or drink 3 hours prior to your test, except you may have water. Do not consume products containing caffeine (regular or decaffeinated) 12 hours prior to your test. (ex: coffee, chocolate, sodas, tea). Do bring a list of your current medications with you.  If not listed below, you may take your medications as normal. Do wear comfortable clothes (no dresses or  overalls) and walking shoes, tennis shoes preferred (No heels or open toe shoes are allowed). Do NOT wear cologne, perfume, aftershave, or lotions (deodorant is allowed). If these instructions are not followed, your test will have to be rescheduled.    Follow-Up: At [facility], you and your health needs are our priority.  As part of our continuing mission to provide you with exceptional heart care, we have created designated Provider Care Teams.  These Care Teams include your primary Cardiologist (physician) and Advanced Practice Providers (APPs -  Physician Assistants and Nurse Practitioners) who all work together to provide you with the care you need, when you need it.  We recommend signing up for the patient portal called "MyChart".  Sign up information is provided on this After Visit Summary.  MyChart is used to connect with patients for Virtual Visits (Telemedicine).  Patients are able to view lab/test results, encounter notes, upcoming appointments, etc.  Non-urgent messages can be sent to your provider as well.   To learn more about what you can do with MyChart, go to [email or web address].    Your next appointment:   1 year(s)  The format for your next appointment:   In Person  Provider:   Quay Burow, MD     Other Instructions   Important Information About Sugar

## 2022-04-10 NOTE — Assessment & Plan Note (Addendum)
History of dyslipidemia on Zetia and rosuvastatin followed by her PCP.  Her most recent lipid profile performed 05/29/2021 revealed total cholesterol 121, LDL 62 and HDL 36.

## 2022-04-10 NOTE — Addendum Note (Signed)
Addended by: Orvan July on: 04/10/2022 04:12 PM   Modules accepted: Orders

## 2022-04-10 NOTE — Assessment & Plan Note (Signed)
History of obstructive sleep apnea on CPAP.  Unfortunately her CPAP machine has not been working for last 2 months.  We will get had this addressed with our practice.

## 2022-04-10 NOTE — Progress Notes (Signed)
04/10/2022 Deborah Elliott   1945/12/23  [id number]  Primary Physician Burnard Bunting, MD Primary Cardiologist: Lorretta Harp MD [name], [place]  HPI:  Deborah Elliott is a 76 y.o.  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 I saw her in the office 03/05/2021. She is accompanied by her husband [name] today.. She has obstructive sleep apnea on CPAP which she benefits from for which she sees Dr. Ellouise Newer.  Unfortunately, her CPAP machine has not worked for the last 2 months.  She has a history of CAD status post PCI and stenting of her LAD in 3 places by myself with Taxus drug-eluting stents back on Mar 14, 2010, after being admitted with unstable angina. She had a normal EF. She did have a 50% to 60% mid RCA lesion that was treated medically. Her other problems include hypertension and hyperlipidemia. Dr. Reynaldo Minium follows her lipid profile closely.   She has had bilateral total knee replacements and left wrist surgery by Dr. Amedeo Plenty.  She also underwent left shoulder surgery by Dr. Onnie Graham.  She currently walks with a cane because of instability.   Since I saw her a year ago she remained stable.  She gets occasional atypical chest pain.  She has chronic dyspnea which I suspect is multifactorial.     Current Meds  Medication Sig   acetaminophen (TYLENOL) 500 MG tablet Take 500 mg by mouth daily as needed (pain).    Apoaequorin (PREVAGEN PO) Take 1 tablet by mouth daily.   clopidogrel (PLAVIX) 75 MG tablet TAKE 1 TABLET DAILY   cyclobenzaprine (FLEXERIL) 10 MG tablet Take 1 tablet (10 mg total) by mouth 3 (three) times daily as needed for muscle spasms.   esomeprazole (NEXIUM) 20 MG capsule Take 20 mg by mouth in the morning.   ezetimibe (ZETIA) 10 MG tablet TAKE ONE TABLET BY MOUTH EVERY DAY   fosinopril (MONOPRIL) 20 MG tablet Take 20 mg by mouth in the  morning.    furosemide (LASIX) 40 MG tablet Take 20 mg by mouth in the morning.    GNP CO Q10 200 MG capsule TAKE (1) CAPSULE DAILY   meclizine (ANTIVERT) 25 MG tablet Take 25 mg by mouth 2 (two) times daily as needed for dizziness.    metFORMIN (GLUCOPHAGE) 500 MG tablet Take 500 mg by mouth 2 (two) times daily.   methocarbamol (ROBAXIN) 500 MG tablet Take 500 mg by mouth every 6 (six) hours as needed for muscle spasms (6 to 8 hours).   metoprolol succinate (TOPROL-XL) 50 MG 24 hr tablet Take 50 mg by mouth in the morning. Take with or immediately following a meal.   ondansetron (ZOFRAN) 4 MG tablet Take 1 tablet (4 mg total) by mouth every 8 (eight) hours as needed for nausea or vomiting.   potassium chloride SA (K-DUR,KLOR-CON) 20 MEQ tablet Take 20 mEq by mouth in the morning.    rosuvastatin (CRESTOR) 40 MG tablet TAKE 1 TABLET ONCE DAILY IN THE EVENING     Allergies  Allergen Reactions   Morphine And Related Other (See Comments)    Reaction unknown per patient   Elemental Sulfur Itching and Rash   Latex Rash    Blood blisters   Tape Rash    Social History   Socioeconomic History   Marital status: Married    Spouse name: Not on file  Number of children: Not on file   Years of education: Not on file   Highest education level: Not on file  Occupational History   Not on file  Tobacco Use   Smoking status: Former    Types: Cigarettes    Quit date: 08/23/1970    Years since quitting: 51.6   Smokeless tobacco: Never  Vaping Use   Vaping Use: Never used  Substance and Sexual Activity   Alcohol use: No   Drug use: No   Sexual activity: Not Currently    Birth control/protection: Post-menopausal  Other Topics Concern   Not on file  Social History Narrative   Not on file   Social Determinants of Health   Financial Resource Strain: Not on file  Food Insecurity: Not on file  Transportation Needs: Not on file  Physical Activity: Not on file  Stress: Not on file  Social  Connections: Not on file  Intimate Partner Violence: Not on file     Review of Systems: General: negative for chills, fever, night sweats or weight changes.  Cardiovascular: negative for chest pain, dyspnea on exertion, edema, orthopnea, palpitations, paroxysmal nocturnal dyspnea or shortness of breath Dermatological: negative for rash Respiratory: negative for cough or wheezing Urologic: negative for hematuria Abdominal: negative for nausea, vomiting, diarrhea, bright red blood per rectum, melena, or hematemesis Neurologic: negative for visual changes, syncope, or dizziness All other systems reviewed and are otherwise negative except as noted above.    Blood pressure (!) 142/84, pulse 92, height '5\' 5"'$  (1.651 m), weight 226 lb (102.5 kg).  General appearance: alert and no distress Neck: no adenopathy, no carotid bruit, no JVD, supple, symmetrical, trachea midline, and thyroid not enlarged, symmetric, no tenderness/mass/nodules Lungs: clear to auscultation bilaterally Heart: regular rate and rhythm, S1, S2 normal, no murmur, click, rub or gallop Extremities: extremities normal, atraumatic, no cyanosis or edema Pulses: 2+ and symmetric Skin: Skin color, texture, turgor normal. No rashes or lesions Neurologic: Grossly normal  EKG sinus rhythm at 92 with lipid branch block.  I personally reviewed this EKG.  ASSESSMENT AND PLAN:   Coronary artery disease History of CAD status post LAD stenting in 3 places by myself in the setting of unstable angina with Taxus drug-eluting stents 03/14/2010.  She occasionally gets chest pain and is chronically short of breath.  I am getting a 2D echo and a Lexiscan Myoview stress test to further evaluate.  Essential hypertension History of essential hypertension a blood pressure measured today at 142/84.  She is on Monopril and metoprolol.  Dyslipidemia History of dyslipidemia on Zetia and rosuvastatin followed by her PCP.  Her most recent lipid profile  performed 05/29/2021 revealed total cholesterol 121, LDL 62 and HDL 36.  Obstructive sleep apnea History of obstructive sleep apnea on CPAP.  Unfortunately her CPAP machine has not been working for last 2 months.  We will get had this addressed with our practice.     Lorretta Harp MD FACP,FACC,FAHA, [facility] 04/10/2022 3:49 PM

## 2022-04-10 NOTE — Assessment & Plan Note (Signed)
History of CAD status post LAD stenting in 3 places by myself in the setting of unstable angina with Taxus drug-eluting stents 03/14/2010.  She occasionally gets chest pain and is chronically short of breath.  I am getting a 2D echo and a Lexiscan Myoview stress test to further evaluate.

## 2022-04-11 ENCOUNTER — Other Ambulatory Visit: Payer: Self-pay | Admitting: Cardiovascular Disease

## 2022-04-11 ENCOUNTER — Telehealth: Payer: Self-pay | Admitting: *Deleted

## 2022-04-11 DIAGNOSIS — G4733 Obstructive sleep apnea (adult) (pediatric): Secondary | ICD-10-CM

## 2022-04-28 ENCOUNTER — Ambulatory Visit: Payer: Medicare Other | Attending: Cardiovascular Disease | Admitting: Cardiology

## 2022-04-28 DIAGNOSIS — G4736 Sleep related hypoventilation in conditions classified elsewhere: Secondary | ICD-10-CM | POA: Diagnosis not present

## 2022-04-28 DIAGNOSIS — G4733 Obstructive sleep apnea (adult) (pediatric): Secondary | ICD-10-CM | POA: Insufficient documentation

## 2022-04-30 ENCOUNTER — Ambulatory Visit (HOSPITAL_COMMUNITY): Payer: Medicare Other | Attending: Internal Medicine

## 2022-04-30 DIAGNOSIS — R079 Chest pain, unspecified: Secondary | ICD-10-CM | POA: Diagnosis present

## 2022-04-30 DIAGNOSIS — R0602 Shortness of breath: Secondary | ICD-10-CM | POA: Diagnosis not present

## 2022-04-30 LAB — ECHOCARDIOGRAM COMPLETE
Area-P 1/2: 3.99 cm2
S' Lateral: 3.4 cm

## 2022-05-01 NOTE — Procedures (Signed)
   Patient Name: Deborah Elliott, Deborah Elliott Study Date: 04/28/2022 Gender: Female D.O.B: 1945/12/11 Age (years): 75 Referring Provider: Lorretta Harp Height (inches): 82 Interpreting Physician: Fransico Him MD, ABSM Weight (lbs): 226 RPSGT: Rosebud Poles BMI: 38 MRN: [id number]  CLINICAL INFORMATION Sleep Study Type: HST  Indication for sleep study: N/A  Epworth Sleepiness Score: N/A  SLEEP STUDY TECHNIQUE A multi-channel overnight portable sleep study was performed. The channels recorded were: nasal airflow, thoracic respiratory movement, and oxygen saturation with a pulse oximetry. Snoring was also monitored.  MEDICATIONS Patient self administered medications include: N/A.  SLEEP ARCHITECTURE Patient was studied for 440.4 minutes. The sleep efficiency was 91.8 % and the patient was supine for 0%. The arousal index was 0.0 per hour.  RESPIRATORY PARAMETERS The overall AHI was 19.3 per hour, with a central apnea index of 0 per hour.  The oxygen nadir was 80% during sleep.  CARDIAC DATA Mean heart rate during sleep was 77.9 bpm.  IMPRESSIONS - Moderate obstructive sleep apnea occurred during this study (AHI = 19.3/h). - Severe oxygen desaturation was noted during this study (Min O2 = 80%). - Patient snored 6% during the sleep.  DIAGNOSIS - Obstructive Sleep Apnea (G47.33)  RECOMMENDATIONS - Recommend in lab CPAP titration. - Avoid alcohol, sedatives and other CNS depressants that may worsen sleep apnea and disrupt normal sleep architecture. - Sleep hygiene should be reviewed to assess factors that may improve sleep quality. - Weight management and regular exercise should be initiated or continued.  [Electronically signed] 05/01/2022 12:25 PM  Fransico Him MD, ABSM Diplomate, American Board of Sleep Medicine

## 2022-05-14 ENCOUNTER — Telehealth (HOSPITAL_COMMUNITY): Payer: Self-pay | Admitting: *Deleted

## 2022-05-14 NOTE — Telephone Encounter (Signed)
Close encounter 

## 2022-05-15 ENCOUNTER — Ambulatory Visit (HOSPITAL_COMMUNITY)
Admission: RE | Admit: 2022-05-15 | Discharge: 2022-05-15 | Disposition: A | Discharge status: Home / Self Care | Payer: Medicare Other | Source: Ambulatory Visit | Attending: Cardiology | Admitting: Cardiology

## 2022-05-15 DIAGNOSIS — R079 Chest pain, unspecified: Secondary | ICD-10-CM

## 2022-05-15 DIAGNOSIS — R0602 Shortness of breath: Secondary | ICD-10-CM | POA: Diagnosis present

## 2022-05-15 MED ORDER — REGADENOSON 0.4 MG/5ML IV SOLN
0.4000 mg | Freq: Once | INTRAVENOUS | Status: AC
  Administered 2022-05-15: 0.4 mg via INTRAVENOUS

## 2022-05-15 MED ORDER — TECHNETIUM TC 99M TETROFOSMIN IV KIT
29.4000 | PACK | Freq: Once | INTRAVENOUS | Status: AC | PRN
  Administered 2022-05-15: 29.4 via INTRAVENOUS

## 2022-05-16 ENCOUNTER — Ambulatory Visit (HOSPITAL_COMMUNITY)
Admission: RE | Admit: 2022-05-16 | Discharge: 2022-05-16 | Disposition: A | Discharge status: Home / Self Care | Payer: Medicare Other | Source: Ambulatory Visit | Attending: Cardiology | Admitting: Cardiology

## 2022-05-16 LAB — MYOCARDIAL PERFUSION IMAGING
LV dias vol: 140 mL (ref 46–106)
LV sys vol: 91 mL
Nuc Stress EF: 35 %
Peak HR: 110 {beats}/min
Rest HR: 88 {beats}/min
Rest Nuclear Isotope Dose: 32 mCi
SDS: 2
SRS: 5
SSS: 7
ST Depression (mm): 0 mm
Stress Nuclear Isotope Dose: 29.4 mCi
TID: 0.89

## 2022-05-16 MED ORDER — TECHNETIUM TC 99M TETROFOSMIN IV KIT
32.0000 | PACK | Freq: Once | INTRAVENOUS | Status: AC | PRN
  Administered 2022-05-16: 32 via INTRAVENOUS

## 2022-05-23 ENCOUNTER — Ambulatory Visit: Payer: Medicare Other | Admitting: Cardiovascular Disease

## 2022-06-02 ENCOUNTER — Telehealth: Payer: Self-pay | Admitting: *Deleted

## 2022-06-02 ENCOUNTER — Ambulatory Visit: Payer: Medicare Other | Admitting: Cardiovascular Disease

## 2022-06-02 ENCOUNTER — Encounter: Payer: Self-pay | Admitting: Cardiovascular Disease

## 2022-06-02 DIAGNOSIS — G4733 Obstructive sleep apnea (adult) (pediatric): Secondary | ICD-10-CM | POA: Diagnosis not present

## 2022-06-02 DIAGNOSIS — E785 Hyperlipidemia, unspecified: Secondary | ICD-10-CM

## 2022-06-02 DIAGNOSIS — I2510 Atherosclerotic heart disease of native coronary artery without angina pectoris: Secondary | ICD-10-CM

## 2022-06-02 DIAGNOSIS — I10 Essential (primary) hypertension: Secondary | ICD-10-CM | POA: Diagnosis not present

## 2022-06-02 NOTE — Assessment & Plan Note (Signed)
History of essential hypertension blood pressure measured today at 132/70.  She is on Monopril and metoprolol.

## 2022-06-02 NOTE — Assessment & Plan Note (Signed)
History of dyslipidemia on statin therapy with lipid profile performed 05/29/2021 revealing a total cholesterol of 121, LDL 62 HDL 36.

## 2022-06-02 NOTE — Telephone Encounter (Signed)
-----   Message from Lauralee Evener, [place] sent at 05/20/2022  8:43 AM EDT -----  ----- Message ----- From: Sueanne Margarita, MD Sent: 05/01/2022  12:26 PM EDT To: [facility] Sleep Studies  Please let patient know that they have sleep apnea.  Recommend therapeutic CPAP titration for treatment of patient's sleep disordered breathing.  If unable to perform an in lab titration then initiate ResMed auto CPAP from 4 to 15cm H2O with heated humidity and mask of choice and overnight pulse ox on CPAP.

## 2022-06-02 NOTE — Progress Notes (Signed)
06/02/2022 Deborah Elliott   [birth year]  [id number]  Primary Physician Burnard Bunting, MD Primary Cardiologist: Lorretta Harp MD [name], [place]  HPI:  Deborah Elliott is a 76 y.o.  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 I saw her in the office 04/11/2022. She is accompanied by her husband [name] today, and daughter Joelene Millin.. She has obstructive sleep apnea on CPAP which she benefits from for which she sees Dr. Ellouise Newer.  Unfortunately, her CPAP machine has not worked in a while.  She has a history of CAD status post PCI and stenting of her LAD in 3 places by myself with Taxus drug-eluting stents back on Mar 14, 2010, after being admitted with unstable angina. She had a normal EF. She did have a 50% to 60% mid RCA lesion that was treated medically. Her other problems include hypertension and hyperlipidemia. Dr. Reynaldo Minium follows her lipid profile closely.   She has had bilateral total knee replacements and left wrist surgery by Dr. Amedeo Plenty.  She also underwent left shoulder surgery by Dr. Onnie Graham.  She currently walks with a cane because of instability.   Since I saw her in the office a month and a half ago I did get several tests because of dyspnea.  Myoview stress test showed septal thinning consistent with left bundle branch block artifact and a 2D echo revealed an EF in the 40 to 45% range an EF in the 40 to 45% range without valvular abnormalities.  She is on a beta-blocker and ACE inhibitor.  Her last assessment of LV function was the time of her intervention 2011 at which time her EF was normal.  Current Meds  Medication Sig   acetaminophen (TYLENOL) 500 MG tablet Take 500 mg by mouth daily as needed (pain).    Apoaequorin (PREVAGEN PO) Take 1 tablet by mouth daily.   clopidogrel (PLAVIX) 75 MG tablet TAKE 1 TABLET DAILY   cyclobenzaprine (FLEXERIL) 10  MG tablet Take 1 tablet (10 mg total) by mouth 3 (three) times daily as needed for muscle spasms.   esomeprazole (NEXIUM) 20 MG capsule Take 20 mg by mouth in the morning.   ezetimibe (ZETIA) 10 MG tablet TAKE ONE TABLET BY MOUTH EVERY DAY   fosinopril (MONOPRIL) 20 MG tablet Take 20 mg by mouth in the morning.    furosemide (LASIX) 40 MG tablet Take 20 mg by mouth in the morning.    GNP CO Q10 200 MG capsule TAKE (1) CAPSULE DAILY   meclizine (ANTIVERT) 25 MG tablet Take 25 mg by mouth 2 (two) times daily as needed for dizziness.    metFORMIN (GLUCOPHAGE) 500 MG tablet Take 500 mg by mouth 2 (two) times daily.   methocarbamol (ROBAXIN) 500 MG tablet Take 500 mg by mouth every 6 (six) hours as needed for muscle spasms (6 to 8 hours).   metoprolol succinate (TOPROL-XL) 50 MG 24 hr tablet Take 50 mg by mouth in the morning. Take with or immediately following a meal.   ondansetron (ZOFRAN) 4 MG tablet Take 1 tablet (4 mg total) by mouth every 8 (eight) hours as needed for nausea or vomiting.   potassium chloride SA (K-DUR,KLOR-CON) 20 MEQ tablet Take 20 mEq by mouth in the morning.    rosuvastatin (CRESTOR) 40 MG tablet TAKE 1 TABLET ONCE DAILY IN THE EVENING     Allergies  Allergen Reactions   Morphine And Related Other (See Comments)    Reaction unknown per patient   Elemental Sulfur Itching and Rash   Latex Rash    Blood blisters   Tape Rash    Social History   Socioeconomic History   Marital status: Married    Spouse name: Not on file   Number of children: Not on file   Years of education: Not on file   Highest education level: Not on file  Occupational History   Not on file  Tobacco Use   Smoking status: Former    Types: Cigarettes    Quit date: 08/23/1970    Years since quitting: 51.8   Smokeless tobacco: Never  Vaping Use   Vaping Use: Never used  Substance and Sexual Activity   Alcohol use: No   Drug use: No   Sexual activity: Not Currently    Birth  control/protection: Post-menopausal  Other Topics Concern   Not on file  Social History Narrative   Not on file   Social Determinants of Health   Financial Resource Strain: Not on file  Food Insecurity: Not on file  Transportation Needs: Not on file  Physical Activity: Not on file  Stress: Not on file  Social Connections: Not on file  Intimate Partner Violence: Not on file     Review of Systems: General: negative for chills, fever, night sweats or weight changes.  Cardiovascular: negative for chest pain, dyspnea on exertion, edema, orthopnea, palpitations, paroxysmal nocturnal dyspnea or shortness of breath Dermatological: negative for rash Respiratory: negative for cough or wheezing Urologic: negative for hematuria Abdominal: negative for nausea, vomiting, diarrhea, bright red blood per rectum, melena, or hematemesis Neurologic: negative for visual changes, syncope, or dizziness All other systems reviewed and are otherwise negative except as noted above.    Blood pressure 132/78, pulse 89, height '5\' 5"'$  (1.651 m), weight 223 lb 6.4 oz (101.3 kg), SpO2 95 %.  General appearance: alert and no distress Neck: no adenopathy, no carotid bruit, no JVD, supple, symmetrical, trachea midline, and thyroid not enlarged, symmetric, no tenderness/mass/nodules Lungs: clear to auscultation bilaterally Heart: regular rate and rhythm, S1, S2 normal, no murmur, click, rub or gallop Extremities: extremities normal, atraumatic, no cyanosis or edema Pulses: 2+ and symmetric Skin: Skin color, texture, turgor normal. No rashes or lesions Neurologic: Grossly normal  EKG not performed today  ASSESSMENT AND PLAN:   Coronary artery disease History of CAD status post PCI and stenting of her LAD by myself in 3 places with Taxus drug-eluting stents 03/14/2010 after being admitted with unstable angina.  She had a normal EF at that time.  She did have a 50 to 60% mid RCA lesion that was treated medically.   She currently denies chest pain.  Essential hypertension History of essential hypertension blood pressure measured today at 132/70.  She is on Monopril and metoprolol.  Dyslipidemia History of dyslipidemia on statin therapy with lipid profile performed 05/29/2021 revealing a total cholesterol of 121, LDL 62 HDL 36.  Obstructive sleep apnea History of obstructive sleep apnea with recent sleep study that showed moderate sleep apnea.  Patient is going to be scheduled for an inpatient CPAP titration study.  She did see Dr. Radford Pax for follow-up with Dr. Claiborne Billings.     Lorretta Harp MD FACP,FACC,FAHA, [facility] 06/02/2022 1:55 PM

## 2022-06-02 NOTE — Telephone Encounter (Addendum)
The patient has been notified of the result and verbalized understanding.  All questions (if any) were answered. [name], [place] 06/02/2022 11:39 AM   Per patient she wants Dr Claiborne Billings to follow her sleep therapy because she says she was referred to Dr Claiborne Billings by Dr Gwenlyn Found.  Will precert Titration

## 2022-06-02 NOTE — Patient Instructions (Addendum)
Medication Instructions:  No changes *If you need a refill on your cardiac medications before your next appointment, please call your pharmacy*   Lab Work: None ordered If you have labs (blood work) drawn today and your tests are completely normal, you will receive your results only by: MyChart Message (if you have MyChart) OR A paper copy in the mail If you have any lab test that is abnormal or we need to change your treatment, we will call you to review the results.   Testing/Procedures: None ordered   Follow-Up: At [facility], you and your health needs are our priority.  As part of our continuing mission to provide you with exceptional heart care, we have created designated Provider Care Teams.  These Care Teams include your primary Cardiologist (physician) and Advanced Practice Providers (APPs -  Physician Assistants and Nurse Practitioners) who all work together to provide you with the care you need, when you need it.  We recommend signing up for the patient portal called "MyChart".  Sign up information is provided on this After Visit Summary.  MyChart is used to connect with patients for Virtual Visits (Telemedicine).  Patients are able to view lab/test results, encounter notes, upcoming appointments, etc.  Non-urgent messages can be sent to your provider as well.   To learn more about what you can do with MyChart, go to [email or web address].    Your next appointment:   Follow up in 6 months with APP Follow up in 12 months with Dr. Berry   Important Information About Sugar       

## 2022-06-02 NOTE — Assessment & Plan Note (Signed)
History of CAD status post PCI and stenting of her LAD by myself in 3 places with Taxus drug-eluting stents 03/14/2010 after being admitted with unstable angina.  She had a normal EF at that time.  She did have a 50 to 60% mid RCA lesion that was treated medically.  She currently denies chest pain.

## 2022-06-02 NOTE — Assessment & Plan Note (Signed)
History of obstructive sleep apnea with recent sleep study that showed moderate sleep apnea.  Patient is going to be scheduled for an inpatient CPAP titration study.  She did see Dr. Radford Pax for follow-up with Dr. Claiborne Billings.

## 2022-07-11 ENCOUNTER — Ambulatory Visit: Payer: Medicare Other | Attending: Cardiovascular Disease | Admitting: Cardiovascular Disease

## 2022-07-11 DIAGNOSIS — G4733 Obstructive sleep apnea (adult) (pediatric): Secondary | ICD-10-CM | POA: Diagnosis present

## 2022-07-11 DIAGNOSIS — I2510 Atherosclerotic heart disease of native coronary artery without angina pectoris: Secondary | ICD-10-CM | POA: Insufficient documentation

## 2022-07-23 ENCOUNTER — Encounter: Payer: Self-pay | Admitting: Cardiovascular Disease

## 2022-07-23 NOTE — Procedures (Signed)
[facility]         Patient Name: Deborah Elliott, Deborah Elliott Date: 07/11/2022 Gender: Female D.O.B: April 17, 1946 Age (years): 16 Referring Provider: Shelva Majestic MD, ABSM Height (inches): 65 Interpreting Physician: Shelva Majestic MD, ABSM Weight (lbs): 226 RPSGT: Peak, Robert BMI: 38 MRN: [id number] Neck Size: <br>  CLINICAL INFORMATION The patient is referred for a CPAP titration to treat sleep apnea.  Date of HST: 04/28/2022: AHI 19.3/H; O2 nadir 80%  SLEEP STUDY TECHNIQUE As per the AASM Manual for the Scoring of Sleep and Associated Events v2.3 (April 2016) with a hypopnea requiring 4% desaturations.  The channels recorded and monitored were frontal, central and occipital EEG, electrooculogram (EOG), submentalis EMG (chin), nasal and oral airflow, thoracic and abdominal wall motion, anterior tibialis EMG, snore microphone, electrocardiogram, and pulse oximetry. Continuous positive airway pressure (CPAP) was initiated at the beginning of the study and titrated to treat sleep-disordered breathing.  MEDICATIONS acetaminophen (TYLENOL) 500 MG tablet Apoaequorin (PREVAGEN PO) clopidogrel (PLAVIX) 75 MG tablet cyclobenzaprine (FLEXERIL) 10 MG tablet esomeprazole (NEXIUM) 20 MG capsule ezetimibe (ZETIA) 10 MG tablet fosinopril (MONOPRIL) 20 MG tablet furosemide (LASIX) 40 MG tablet GNP CO Q10 200 MG capsule meclizine (ANTIVERT) 25 MG tablet metFORMIN (GLUCOPHAGE) 500 MG tablet methocarbamol (ROBAXIN) 500 MG tablet metoprolol succinate (TOPROL-XL) 50 MG 24 hr tablet ondansetron (ZOFRAN) 4 MG tablet potassium chloride SA (K-DUR,KLOR-CON) 20 MEQ tablet rosuvastatin (CRESTOR) 40 MG tablet Medications self-administered by patient taken the night of the study : N/A  TECHNICIAN COMMENTS Comments added by technician: PATIENT STABLIZIED WITH CPAP 12 CMH2O, EPR 3. Comments added by scorer: N/A  RESPIRATORY PARAMETERS Optimal PAP Pressure (cm): 12 AHI at  Optimal Pressure (/hr): 0.2 Overall Minimal O2 (%): 88.00 Supine % at Optimal Pressure (%): 14 Minimal O2 at Optimal Pressure (%): 88.0   SLEEP ARCHITECTURE The study was initiated at 10:00:10 PM and ended at 5:37:27 AM.  Sleep onset time was 20.7 minutes and the sleep efficiency was 62.9%. The total sleep time was 287.5 minutes.  The patient spent 9.22% of the night in stage N1 sleep, 57.92% in stage N2 sleep, 0.00% in stage N3 and 32.9% in REM.Stage REM latency was 174.0 minutes  Wake after sleep onset was 149.0. Alpha intrusion was absent. Supine sleep was 12.43%.  CARDIAC DATA The 2 lead EKG demonstrated sinus rhythm. The mean heart rate was 72.55 beats per minute. Other EKG findings include: None.  LEG MOVEMENT DATA The total Periodic Limb Movements of Sleep (PLMS) were 0. The PLMS index was 0.00. A PLMS index of <15 is considered normal in adults.  IMPRESSIONS - CPAP was initiated at 5 cm and was titrated to optimal PAP pressure was 12 cm of water. (AHI 0.2/h; O2 nadir 88%) - Mild oxygen desaturations to a nadir of 88.00%. - No snoring was audible during this study. - No cardiac abnormalities were observed during this study. - Clinically significant periodic limb movements were not noted during this study. Arousals associated with PLMs were rare.  DIAGNOSIS - Obstructive Sleep Apnea (G47.33)  RECOMMENDATIONS - Recommend an initial trial of CPAP Auto with EPR of 3 at 11 - 15 cm H2O with heated humidification. An Extra Small size Fisher[employer]Paykel Full Face Evora Full mask was used for the titration. - Effort should be made to optimize nasal and oropharyngeal patency. -  Avoid alcohol, sedatives and other CNS depressants that may worsen sleep apnea and disrupt normal sleep architecture. - Sleep hygiene should be reviewed to assess factors that may improve sleep quality. - Weight management and regular exercise should be initiated or continued. - Recommend a download and sleep clinic  evaluation after 4 weeks of therapy.   [Electronically signed] 07/23/2022 03:36 PM  Shelva Majestic MD, [facility], ABSM Diplomate, American Board of Sleep Medicine  NPI: [id number]  [place] PH: [phone]   FX: [phone] [place]

## 2022-07-31 ENCOUNTER — Telehealth: Payer: Self-pay | Admitting: *Deleted

## 2022-07-31 NOTE — Telephone Encounter (Signed)
Patient notified CPAP titration has been completed. Order for CPAP per patient's request will be sent to [facility].

## 2022-07-31 NOTE — Telephone Encounter (Signed)
-----   Message from Troy Sine, MD sent at 07/23/2022  3:41 PM EDT ----- Deborah Elliott, please notify patient of the results.  Initiate CPAP DME as prescribed.

## 2022-08-06 ENCOUNTER — Other Ambulatory Visit: Payer: Self-pay | Admitting: Internal Medicine

## 2022-08-06 DIAGNOSIS — N6320 Unspecified lump in the left breast, unspecified quadrant: Secondary | ICD-10-CM

## 2022-08-07 ENCOUNTER — Telehealth: Payer: Self-pay | Admitting: Cardiovascular Disease

## 2022-08-07 NOTE — Telephone Encounter (Signed)
Patient c/o Palpitations:  High priority if patient c/o lightheadedness, shortness of breath, or chest pain  How long have you had palpitations/irregular HR/ Afib? Are you having the symptoms now?  Patient went into afib Saturday, 10/14, night. Patient's daughter is not currently with the patient, unsure whether or not she is still in afib   Are you currently experiencing lightheadedness, SOB or CP?  Not currently with the patient, unsure  Do you have a history of afib (atrial fibrillation) or irregular heart rhythm?  Yes   Have you checked your BP or HR? (document readings if available):  Not that patient's daughter is aware of She states the patient doesn't have a good BP cuff  Are you experiencing any other symptoms?  Patient's daughter mentions the patient is always SOB, yesterday (10/18) patient mentioned to her that she didn't feel good

## 2022-08-07 NOTE — Telephone Encounter (Signed)
Spoke with daughter, who called triage and couldn't get enough information from her. Called patient who reported that last Saturday, she went to bed with a rapid heart rate that got worse. The palpitations, sob, dizziness, and pounding in her chest lasted for 3 hours, which tired her. Her husband was in another bedroom and she couldn't get him to wake up to help her. She didn't call EMS because they "couldn't have gotten in the house." Patient stated she feels "pretty good right now." She stated she has felt tired since her COVID vaccinations (doesn't remember the dates). She does not have BP cuff. Appointment made with Melvenia Needles for 10/25. Recommended to patient that if she has another episode like she did on Saturday, to be taken to the ED. She verbalized understanding.

## 2022-08-11 NOTE — Progress Notes (Unsigned)
Cardiology Office Note:    Date:  08/13/2022   ID:  Janyra, Barillas [birth year], MRN [id number]  PCP:  Burnard Bunting, MD [place] Cardiologist: Quay Burow, MD   Reason for visit: Palpitations  History of Present Illness:    Deborah Elliott is a 76 y.o. 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  She last saw Dr. Gwenlyn Found in August 2023.  With dyspnea, she had recent testing.  2D echo showed EF 40 to 45% without valve disease and Myoview stress test was negative for ischemia.  She was scheduled for further CPAP titration for OSA.  Patient called 10/19 reporting rapid heart rate, shortness of breath, dizziness and fatigue.  Patient reported tiredness following COVID-vaccine.   Today, patient comes in with her husband.  She states the episode on October 19 woke her up from her sleep.  Patient feels chest soreness after these palpitation episodes.  They have been happening every 2 to 3 months without identified trigger for the past approximately 1 year.  Episodes last 2 to 3 hours.  She does think the episodes are lasting longer and occurring more frequently.  She has no history of arrhythmias and has not worn a heart monitor to her knowledge.  Patient is scheduled to pick up a new CPAP machine the first week of November.  Patient denies significant caffeine use.  Denies alcohol use.  Her diabetes is well controlled.  Blood pressure controlled.  TSH normal in August 2023.  Not anemic.  Potassium normal in August.  Denies ischemic symptoms.  Otherwise she denies leg swelling.  Her weight has been stable between 216 to 220 pounds.  Her breathing feels heavy when she first lays down.  She has a cough when she lays down; she wonders if it is from postnasal drip.  She denies exertional chest pain, no shortness of breath on a regular basis.  She states she stopped taking Lasix due to frequent  urination.    Past Medical History:  Diagnosis Date   Achilles rupture, left 04/06/2012   Arthritis of knee    Cancer ([place])    skin cancer   Coronary artery disease    X3 STENTS   Diabetes mellitus without complication ([place])    type 2    Dysrhythmia    HX of tachycardia - controlled with med   GERD (gastroesophageal reflux disease)    Hx of skin cancer, basal cell    Hyperlipidemia    Hypertension    Leg cramps    Obesity    Sleep apnea    uses c-pap    Vertigo     Past Surgical History:  Procedure Laterality Date   APPENDECTOMY  7 YRS AGO   COLONOSCOPY     NM MYOCAR PERF WALL MOTION  05/27/2010   normal   OPEN REDUCTION INTERNAL FIXATION (ORIF) DISTAL RADIAL FRACTURE Left 01/06/2020   Procedure: OPEN REDUCTION INTERNAL FIXATION (ORIF) DISTAL RADIAL FRACTURE AND REPAIR AS NECESSARY;  Surgeon: Roseanne Kaufman, MD;  Location: [facility] [place];  Service: Orthopedics;  Laterality: Left;  Block with IV sed 60 mins   REVERSE SHOULDER ARTHROPLASTY Left 03/15/2020   Procedure: REVERSE SHOULDER ARTHROPLASTY;  Surgeon: Justice Britain, MD;  Location: [facility];  Service: Orthopedics;  Laterality: Left;  145mn   SKIN CANCER EXCISION     STENTED CORONARY ARTERY  03/14/2010   X3 STENTS; tandem proximal and mid  LAD   TOTAL KNEE ARTHROPLASTY Right 08/29/2013   Procedure: RIGHT TOTAL KNEE ARTHROPLASTY;  Surgeon: Gearlean Alf, MD;  Location: [facility];  Service: Orthopedics;  Laterality: Right;   TOTAL KNEE ARTHROPLASTY Left 08/16/2018   Procedure: LEFT TOTAL KNEE ARTHROPLASTY;  Surgeon: Gaynelle Arabian, MD;  Location: [facility];  Service: Orthopedics;  Laterality: Left;  89mn    Current Medications: Current Meds  Medication Sig   acetaminophen (TYLENOL) 500 MG tablet Take 500 mg by mouth daily as needed (pain).    Apoaequorin (PREVAGEN PO) Take 1 tablet by mouth daily.   clopidogrel (PLAVIX) 75 MG tablet TAKE 1 TABLET DAILY   ezetimibe (ZETIA) 10 MG tablet TAKE ONE TABLET BY MOUTH EVERY DAY    fosinopril (MONOPRIL) 20 MG tablet Take 20 mg by mouth in the morning.    GNP CO Q10 200 MG capsule TAKE (1) CAPSULE DAILY   Lancets (ONETOUCH DELICA PLUS [id number] MISC Apply topically 2 (two) times daily.   metFORMIN (GLUCOPHAGE) 500 MG tablet Take 500 mg by mouth 2 (two) times daily.   metoprolol tartrate (LOPRESSOR) 50 MG tablet Take 1 tablet (50 mg total) by mouth as needed (palpitations).   ONETOUCH VERIO test strip 2 (two) times daily.   potassium chloride SA (K-DUR,KLOR-CON) 20 MEQ tablet Take 20 mEq by mouth as needed. Take 1 tablet as needed when Lasix is taken    rosuvastatin (CRESTOR) 40 MG tablet TAKE 1 TABLET ONCE DAILY IN THE EVENING   [DISCONTINUED] metoprolol succinate (TOPROL-XL) 50 MG 24 hr tablet Take 50 mg by mouth in the morning. Take with or immediately following a meal.     Allergies:   Morphine and related, Elemental sulfur, Latex, and Tape   Social History   Socioeconomic History   Marital status: Married    Spouse name: Not on file   Number of children: Not on file   Years of education: Not on file   Highest education level: Not on file  Occupational History   Not on file  Tobacco Use   Smoking status: Former    Types: Cigarettes    Quit date: 08/23/1970    Years since quitting: 52.0   Smokeless tobacco: Never  Vaping Use   Vaping Use: Never used  Substance and Sexual Activity   Alcohol use: No   Drug use: No   Sexual activity: Not Currently    Birth control/protection: Post-menopausal  Other Topics Concern   Not on file  Social History Narrative   Not on file   Social Determinants of Health   Financial Resource Strain: Not on file  Food Insecurity: Not on file  Transportation Needs: Not on file  Physical Activity: Not on file  Stress: Not on file  Social Connections: Not on file     Family History: The patient's family history includes Aneurysm in her father; Cancer in her maternal grandmother; Diabetes in her mother; Hypertension in her  mother; Stroke in her mother.  ROS:   Please see the history of present illness.     EKGs/Labs/Other Studies Reviewed:    EKG:  The ekg ordered today demonstrates normal sinus rhythm, left bundle branch block with heart rate of 90.  QRS duration 152 ms.  Recent Labs: No results found for requested labs within last 365 days.   Recent Lipid Panel Lab Results  Component Value Date/Time   CHOL 152 03/21/2021 09:00 AM   TRIG 136 03/21/2021 09:00 AM   HDL 42 03/21/2021 09:00 AM  [place] 86 03/21/2021 09:00 AM    Physical Exam:    VS:  BP 124/86 (BP Location: Left Arm, Patient Position: Sitting, Cuff Size: Large)   Pulse 90   Ht '5\' 4"'$  (1.626 m)   Wt 220 lb 6.4 oz (100 kg)   SpO2 97%   BMI 37.83 kg/m    No data found.       Wt Readings from Last 3 Encounters:  08/13/22 220 lb 6.4 oz (100 kg)  06/02/22 223 lb 6.4 oz (101.3 kg)  05/15/22 226 lb (102.5 kg)     GEN: Well nourished, well developed in no acute distress HEENT: Normal NECK: No JVD; No carotid bruits CARDIAC: RRR, no murmurs, rubs, gallops RESPIRATORY:  Clear to auscultation without rales, wheezing or rhonchi  ABDOMEN: Soft, non-tender, non-distended MUSCULOSKELETAL: Trace ankle edema  SKIN: Warm and dry NEUROLOGIC:  Alert and oriented PSYCHIATRIC:  Normal affect    ASSESSMENT AND PLAN   Palpitations -Occurring every 2 to 3 months without known trigger, episodes lasting 2 to 3 hours -EKG today shows normal sinus rhythm with heart rate 90, LBBB -Recommend following up on new CPAP machine to treat sleep apnea. -Weight loss recommended.  Limit caffeine intake. -Order 2-week Zio patch.   -If Zio patch is unremarkable and symptoms persist with treatment of sleep apnea, may consider loop recorder placement since episodes are infrequent. -Patient does not have a smart phone (to consider Kardia device) or smart watch. -Increase Toprol from 50 mg to 100 mg daily. -Gave prescription for metoprolol tartrate 50 mg  as needed for heart racing.  Coronary disease, no angina -PCI to LAD in 2011, residual moderate mid RCA lesion treated medically -Stress test July 2023 with no ischemia, perfusion defect probably LBBB related artifact -Continue beta-blocker, Plavix and statin therapy.  OSA -Sleep study showed moderate sleep apnea -Patient is getting new CPAP mask.  LBBB cardiomyopathy -Decreased EF of 40-45% likely thought secondary to LBBB -Patient with trace ankle edema today.  Advised to take Lasix as needed for fluid retention.  Hypertension, well controlled -Continue Monopril.  Kidney function normal in July 2023. -Goal BP is <130/80.  Recommend DASH diet (high in vegetables, fruits, low-fat dairy products, whole grains, poultry, fish, and nuts and low in sweets, sugar-sweetened beverages, and red meats), salt restriction and increase physical activity.  Hyperlipidemia with goal LDL less than 70  -LDL 68 in August 2023.  Continue Crestor.  Obesity -Weight loss/healthy eating recommended. -Recommend moderate intensity activity for 30 minutes 5 days/week and the DASH diet.     Disposition - Follow-up in 3 months with Dr. Gwenlyn Found.   Medication Adjustments/Labs and Tests Ordered: Current medicines are reviewed at length with the patient today.  Concerns regarding medicines are outlined above.  Orders Placed This Encounter  Procedures   LONG TERM MONITOR (3-14 DAYS)   EKG 12-Lead   Meds ordered this encounter  Medications   furosemide (LASIX) 20 MG tablet    Sig: Take 1 tablet (20 mg total) by mouth as needed for edema (leg swelling).    Dispense:  30 tablet    Refill:  0   metoprolol succinate (TOPROL-XL) 100 MG 24 hr tablet    Sig: Take 1 tablet (100 mg total) by mouth in the morning. Take with or immediately following a meal.    Dispense:  90 tablet    Refill:  1    Dose change new Rx   metoprolol tartrate (LOPRESSOR) 50 MG tablet  Sig: Take 1 tablet (50 mg total) by mouth as needed  (palpitations).    Dispense:  30 tablet    Refill:  0    Patient Instructions  Medication Instructions:  INCREASE Metoprolol Succinate (Toprol-XL) to 100 mg in the mornings START Metoprolol Tartrate (Lopressor) 50 mg as needed for palpitations TAKE Lasix as needed for leg swelling TAKE Potassium as needed when Lasix is taken   *If you need a refill on your cardiac medications before your next appointment, please call your pharmacy*  Lab Work: NONE ordered at this time of appointment   If you have labs (blood work) drawn today and your tests are completely normal, you will receive your results only by: MyChart Message (if you have MyChart) OR A paper copy in the mail If you have any lab test that is abnormal or we need to change your treatment, we will call you to review the results.  Testing/Procedures:  Bryn Gulling- Long Term Monitor Instructions  Caron Presume, PA-C has requested you wear a ZIO patch monitor for 14 days.  This is a single patch monitor. [employer] supplies one patch monitor per enrollment. Additional stickers are not available. Please do not apply patch if you will be having a Nuclear Stress Test,  Echocardiogram, Cardiac CT, MRI, or Chest Xray during the period you would be wearing the  monitor. The patch cannot be worn during these tests. You cannot remove and re-apply the  ZIO XT patch monitor.  Your ZIO patch monitor will be mailed 3 day USPS to your address on file. It may take 3-5 days  to receive your monitor after you have been enrolled.  Once you have received your monitor, please review the enclosed instructions. Your monitor  has already been registered assigning a specific monitor serial # to you.  Billing and Patient Assistance Program Information  We have supplied [employer] with any of your insurance information on file for billing purposes. [employer] offers a sliding scale Patient Assistance Program for patients that do not have  insurance, or whose  insurance does not completely cover the cost of the ZIO monitor.  You must apply for the Patient Assistance Program to qualify for this discounted rate.  To apply, please call [employer] at [phone], select option 4, select option 2, ask to apply for  Patient Assistance Program. Theodore Demark will ask your household income, and how many people  are in your household. They will quote your out-of-pocket cost based on that information.  [employer] will also be able to set up a 80-month interest-free payment plan if needed.  Applying the monitor   Shave hair from upper left chest.  Hold abrader disc by orange tab. Rub abrader in 40 strokes over the upper left chest as  indicated in your monitor instructions.  Clean area with 4 enclosed alcohol pads. Let dry.  Apply patch as indicated in monitor instructions. Patch will be placed under collarbone on left  side of chest with arrow pointing upward.  Rub patch adhesive wings for 2 minutes. Remove white label marked "1". Remove the white  label marked "2". Rub patch adhesive wings for 2 additional minutes.  While looking in a mirror, press and release button in center of patch. A small green light will  flash 3-4 times. This will be your only indicator that the monitor has been turned on.  Do not shower for the first 24 hours. You may shower after the first 24 hours.  Press the button if you feel  a symptom. You will hear a small click. Record Date, Time and  Symptom in the Patient Logbook.  When you are ready to remove the patch, follow instructions on the last 2 pages of Patient  Logbook. Stick patch monitor onto the last page of Patient Logbook.  Place Patient Logbook in the blue and white box. Use locking tab on box and tape box closed  securely. The blue and white box has prepaid postage on it. Please place it in the mailbox as  soon as possible. Your physician should have your test results approximately 7 days after the  monitor has been mailed back  to [facility].  Call [facility] at [phone] if you have questions regarding  your ZIO XT patch monitor. Call them immediately if you see an orange light blinking on your  monitor.  If your monitor falls off in less than 4 days, contact our Monitor department at [phone].  If your monitor becomes loose or falls off after 4 days call [employer] at [phone] for  suggestions on securing your monitor  Follow-Up: At [facility], you and your health needs are our priority.  As part of our continuing mission to provide you with exceptional heart care, we have created designated Provider Care Teams.  These Care Teams include your primary Cardiologist (physician) and Advanced Practice Providers (APPs -  Physician Assistants and Nurse Practitioners) who all work together to provide you with the care you need, when you need it.  We recommend signing up for the patient portal called "MyChart".  Sign up information is provided on this After Visit Summary.  MyChart is used to connect with patients for Virtual Visits (Telemedicine).  Patients are able to view lab/test results, encounter notes, upcoming appointments, etc.  Non-urgent messages can be sent to your provider as well.   To learn more about what you can do with MyChart, go to [email or web address].    Your next appointment:   3 month(s)  The format for your next appointment:   In Person  Provider:   Quay Burow, MD  or Caron Presume, PA-C       Other Instructions Log heart rate and blood pressure during palpitation episodes   Important Information About Sugar         Signed, Warren Lacy, PA-C  08/13/2022 12:59 PM    [facility]

## 2022-08-13 ENCOUNTER — Encounter: Payer: Self-pay | Admitting: Physician Assistant

## 2022-08-13 ENCOUNTER — Ambulatory Visit: Payer: Medicare Other | Attending: Physician Assistant | Admitting: Physician Assistant

## 2022-08-13 ENCOUNTER — Ambulatory Visit (INDEPENDENT_AMBULATORY_CARE_PROVIDER_SITE_OTHER): Payer: Medicare Other

## 2022-08-13 VITALS — BP 124/86 | HR 90 | Ht 64.0 in | Wt 220.4 lb

## 2022-08-13 DIAGNOSIS — I2510 Atherosclerotic heart disease of native coronary artery without angina pectoris: Secondary | ICD-10-CM

## 2022-08-13 DIAGNOSIS — I10 Essential (primary) hypertension: Secondary | ICD-10-CM

## 2022-08-13 DIAGNOSIS — G4733 Obstructive sleep apnea (adult) (pediatric): Secondary | ICD-10-CM | POA: Diagnosis not present

## 2022-08-13 DIAGNOSIS — R002 Palpitations: Secondary | ICD-10-CM | POA: Diagnosis not present

## 2022-08-13 DIAGNOSIS — E785 Hyperlipidemia, unspecified: Secondary | ICD-10-CM

## 2022-08-13 MED ORDER — FUROSEMIDE 20 MG PO TABS: 20.0000 mg | ORAL_TABLET | ORAL | 0 refills | Status: DC | PRN

## 2022-08-13 MED ORDER — METOPROLOL SUCCINATE ER 100 MG PO TB24: 100.0000 mg | ORAL_TABLET | Freq: Every morning | ORAL | 1 refills | Status: DC

## 2022-08-13 MED ORDER — METOPROLOL TARTRATE 50 MG PO TABS: 50.0000 mg | ORAL_TABLET | ORAL | 0 refills | Status: DC | PRN

## 2022-08-13 NOTE — Patient Instructions (Signed)
Medication Instructions:  INCREASE Metoprolol Succinate (Toprol-XL) to 100 mg in the mornings START Metoprolol Tartrate (Lopressor) 50 mg as needed for palpitations TAKE Lasix as needed for leg swelling TAKE Potassium as needed when Lasix is taken   *If you need a refill on your cardiac medications before your next appointment, please call your pharmacy*  Lab Work: NONE ordered at this time of appointment   If you have labs (blood work) drawn today and your tests are completely normal, you will receive your results only by: [place] (if you have MyChart) OR A paper copy in the mail If you have any lab test that is abnormal or we need to change your treatment, we will call you to review the results.  Testing/Procedures:  Bryn Gulling- Long Term Monitor Instructions  Caron Presume, PA-C has requested you wear a ZIO patch monitor for 14 days.  This is a single patch monitor. [employer] supplies one patch monitor per enrollment. Additional stickers are not available. Please do not apply patch if you will be having a Nuclear Stress Test,  Echocardiogram, Cardiac CT, MRI, or Chest Xray during the period you would be wearing the  monitor. The patch cannot be worn during these tests. You cannot remove and re-apply the  ZIO XT patch monitor.  Your ZIO patch monitor will be mailed 3 day USPS to your address on file. It may take 3-5 days  to receive your monitor after you have been enrolled.  Once you have received your monitor, please review the enclosed instructions. Your monitor  has already been registered assigning a specific monitor serial # to you.  Billing and Patient Assistance Program Information  We have supplied [employer] with any of your insurance information on file for billing purposes. [employer] offers a sliding scale Patient Assistance Program for patients that do not have  insurance, or whose insurance does not completely cover the cost of the ZIO monitor.  You must apply for  the Patient Assistance Program to qualify for this discounted rate.  To apply, please call [employer] at [phone], select option 4, select option 2, ask to apply for  Patient Assistance Program. Theodore Demark will ask your household income, and how many people  are in your household. They will quote your out-of-pocket cost based on that information.  [employer] will also be able to set up a 5-month interest-free payment plan if needed.  Applying the monitor   Shave hair from upper left chest.  Hold abrader disc by orange tab. Rub abrader in 40 strokes over the upper left chest as  indicated in your monitor instructions.  Clean area with 4 enclosed alcohol pads. Let dry.  Apply patch as indicated in monitor instructions. Patch will be placed under collarbone on left  side of chest with arrow pointing upward.  Rub patch adhesive wings for 2 minutes. Remove white label marked "1". Remove the white  label marked "2". Rub patch adhesive wings for 2 additional minutes.  While looking in a mirror, press and release button in center of patch. A small green light will  flash 3-4 times. This will be your only indicator that the monitor has been turned on.  Do not shower for the first 24 hours. You may shower after the first 24 hours.  Press the button if you feel a symptom. You will hear a small click. Record Date, Time and  Symptom in the Patient Logbook.  When you are ready to remove the patch, follow instructions on the last  2 pages of Patient  Logbook. Stick patch monitor onto the last page of Patient Logbook.  Place Patient Logbook in the blue and white box. Use locking tab on box and tape box closed  securely. The blue and white box has prepaid postage on it. Please place it in the mailbox as  soon as possible. Your physician should have your test results approximately 7 days after the  monitor has been mailed back to [facility].  Call [place] at [phone] if you have  questions regarding  your ZIO XT patch monitor. Call them immediately if you see an orange light blinking on your  monitor.  If your monitor falls off in less than 4 days, contact our Monitor department at [phone].  If your monitor becomes loose or falls off after 4 days call [employer] at [phone] for  suggestions on securing your monitor  Follow-Up: At [facility], you and your health needs are our priority.  As part of our continuing mission to provide you with exceptional heart care, we have created designated Provider Care Teams.  These Care Teams include your primary Cardiologist (physician) and Advanced Practice Providers (APPs -  Physician Assistants and Nurse Practitioners) who all work together to provide you with the care you need, when you need it.  We recommend signing up for the patient portal called "MyChart".  Sign up information is provided on this After Visit Summary.  MyChart is used to connect with patients for Virtual Visits (Telemedicine).  Patients are able to view lab/test results, encounter notes, upcoming appointments, etc.  Non-urgent messages can be sent to your provider as well.   To learn more about what you can do with MyChart, go to [email or web address].    Your next appointment:   3 month(s)  The format for your next appointment:   In Person  Provider:   Quay Burow, MD  or Caron Presume, PA-C       Other Instructions Log heart rate and blood pressure during palpitation episodes   Important Information About Sugar

## 2022-08-13 NOTE — Progress Notes (Unsigned)
Enrolled patient for a 14 day Zio XT monitor to be mailed to patients home   Dr Gwenlyn Found to read

## 2022-08-14 ENCOUNTER — Ambulatory Visit
Admission: RE | Admit: 2022-08-14 | Discharge: 2022-08-14 | Disposition: A | Discharge status: Home / Self Care | Payer: Medicare Other | Source: Ambulatory Visit | Attending: Internal Medicine | Admitting: Internal Medicine

## 2022-08-14 DIAGNOSIS — N6320 Unspecified lump in the left breast, unspecified quadrant: Secondary | ICD-10-CM

## 2022-08-18 DIAGNOSIS — R002 Palpitations: Secondary | ICD-10-CM

## 2022-09-16 ENCOUNTER — Other Ambulatory Visit: Payer: Self-pay | Admitting: Physician Assistant

## 2022-09-17 ENCOUNTER — Telehealth: Payer: Self-pay

## 2022-09-17 NOTE — Telephone Encounter (Addendum)
Called patient regarding results. Spoke with patients husband larry. Patients husband had understanding of results.----- Message from Warren Lacy, PA-C sent at 09/16/2022  1:11 PM EST ----- 2-week heart monitor shows normal sinus rhythm.  No sustained arrhythmias.  No atrial fibrillation noted.  Average heart rate 63.  Recommendations: -Recommend following up on new CPAP machine to treat sleep apnea. -Weight loss recommended.  Limit caffeine intake. -If Zio patch is unremarkable and symptoms persist with treatment of sleep apnea, may consider loop recorder placement since episodes are infrequent. -Patient does not have a smart phone (to consider Kardia device) or smart watch. -Continue Toprol 100 mg daily. -Can take metoprolol tartrate 50 mg as needed for heart racing.

## 2022-10-15 ENCOUNTER — Other Ambulatory Visit: Payer: Self-pay | Admitting: Cardiovascular Disease

## 2022-11-14 ENCOUNTER — Ambulatory Visit: Payer: Medicare Other | Admitting: Cardiovascular Disease

## 2022-11-14 ENCOUNTER — Other Ambulatory Visit: Payer: Self-pay | Admitting: Cardiovascular Disease

## 2022-12-08 ENCOUNTER — Other Ambulatory Visit: Payer: Self-pay | Admitting: Physician Assistant

## 2022-12-08 ENCOUNTER — Other Ambulatory Visit: Payer: Self-pay | Admitting: Cardiovascular Disease

## 2022-12-09 ENCOUNTER — Telehealth: Payer: Self-pay | Admitting: Cardiovascular Disease

## 2022-12-09 ENCOUNTER — Other Ambulatory Visit: Payer: Self-pay | Admitting: Cardiovascular Disease

## 2022-12-09 NOTE — Telephone Encounter (Signed)
Pt c/o medication issue:  1. Name of Medication:   metoprolol tartrate (LOPRESSOR) 50 MG tablet  metoprolol succinate (TOPROL-XL) 100 MG 24 hr tablet   2. How are you currently taking this medication (dosage and times per day)?   Unknown  3. Are you having a reaction (difficulty breathing--STAT)?   Unknown  4. What is your medication issue?   Caller stated they will need clarification on instructions for these medication.

## 2022-12-09 NOTE — Telephone Encounter (Addendum)
  Instruction from office visit  INCREASE Metoprolol Succinate (Toprol-XL) to 100 mg in the mornings START Metoprolol Tartrate (Lopressor) 50 mg as needed for palpitations   RN spoke to pharmacist. They would need parameters on how often the metoprolol tartrate can be taken in a given day for insurance purposes.   Aware will need to send to provider for clarification

## 2022-12-10 MED ORDER — METOPROLOL TARTRATE 50 MG PO TABS: ORAL_TABLET | ORAL | 3 refills | Status: AC

## 2022-12-10 NOTE — Telephone Encounter (Signed)
Per Caron Presume PA-C  Can take metoprolol tartrate as needed every 6 hours up to 3 times daily for palpitations     RN called pharmacist- Stu  at [facility] and home care . Verbally given the direction for metoprolol tartrate. Pharmacist request to e-scribe  new directions  New medication  direction sent

## 2022-12-17 ENCOUNTER — Ambulatory Visit: Payer: Medicare Other | Admitting: Cardiovascular Disease

## 2022-12-23 ENCOUNTER — Emergency Department (HOSPITAL_COMMUNITY)
Admission: EM | Admit: 2022-12-23 | Discharge: 2022-12-23 | Disposition: A | Discharge status: Home / Self Care | Payer: Medicare Other | Attending: Emergency Medicine | Admitting: Emergency Medicine

## 2022-12-23 ENCOUNTER — Emergency Department (HOSPITAL_COMMUNITY): Payer: Medicare Other

## 2022-12-23 DIAGNOSIS — E119 Type 2 diabetes mellitus without complications: Secondary | ICD-10-CM | POA: Insufficient documentation

## 2022-12-23 DIAGNOSIS — Z8616 Personal history of COVID-19: Secondary | ICD-10-CM | POA: Diagnosis not present

## 2022-12-23 DIAGNOSIS — Z91040 Latex allergy status: Secondary | ICD-10-CM | POA: Diagnosis not present

## 2022-12-23 DIAGNOSIS — R0789 Other chest pain: Secondary | ICD-10-CM | POA: Diagnosis not present

## 2022-12-23 DIAGNOSIS — Z7984 Long term (current) use of oral hypoglycemic drugs: Secondary | ICD-10-CM | POA: Insufficient documentation

## 2022-12-23 DIAGNOSIS — Z7902 Long term (current) use of antithrombotics/antiplatelets: Secondary | ICD-10-CM | POA: Diagnosis not present

## 2022-12-23 DIAGNOSIS — I10 Essential (primary) hypertension: Secondary | ICD-10-CM | POA: Diagnosis not present

## 2022-12-23 DIAGNOSIS — I2510 Atherosclerotic heart disease of native coronary artery without angina pectoris: Secondary | ICD-10-CM | POA: Insufficient documentation

## 2022-12-23 DIAGNOSIS — R7989 Other specified abnormal findings of blood chemistry: Secondary | ICD-10-CM | POA: Diagnosis not present

## 2022-12-23 DIAGNOSIS — R079 Chest pain, unspecified: Secondary | ICD-10-CM | POA: Diagnosis present

## 2022-12-23 DIAGNOSIS — R600 Localized edema: Secondary | ICD-10-CM | POA: Insufficient documentation

## 2022-12-23 LAB — BRAIN NATRIURETIC PEPTIDE: B Natriuretic Peptide: 319.2 pg/mL — ABNORMAL HIGH (ref 0.0–100.0)

## 2022-12-23 LAB — COMPREHENSIVE METABOLIC PANEL WITH GFR
ALT: 14 U/L (ref 0–44)
AST: 23 U/L (ref 15–41)
Albumin: 3.2 g/dL — ABNORMAL LOW (ref 3.5–5.0)
Alkaline Phosphatase: 44 U/L (ref 38–126)
Anion gap: 8 (ref 5–15)
BUN: 9 mg/dL (ref 8–23)
CO2: 22 mmol/L (ref 22–32)
Calcium: 8.6 mg/dL — ABNORMAL LOW (ref 8.9–10.3)
Chloride: 110 mmol/L (ref 98–111)
Creatinine, Ser: 0.63 mg/dL (ref 0.44–1.00)
GFR, Estimated: >60 mL/min
Glucose, Bld: 124 mg/dL — ABNORMAL HIGH (ref 70–99)
Potassium: 3.9 mmol/L (ref 3.5–5.1)
Sodium: 140 mmol/L (ref 135–145)
Total Bilirubin: 0.9 mg/dL (ref 0.3–1.2)
Total Protein: 5.6 g/dL — ABNORMAL LOW (ref 6.5–8.1)

## 2022-12-23 LAB — CBC
HCT: 37.4 % (ref 36.0–46.0)
Hemoglobin: 12.6 g/dL (ref 12.0–15.0)
MCH: 30.2 pg (ref 26.0–34.0)
MCHC: 33.7 g/dL (ref 30.0–36.0)
MCV: 89.7 fL (ref 80.0–100.0)
Platelets: 117 10*3/uL — ABNORMAL LOW (ref 150–400)
RBC: 4.17 MIL/uL (ref 3.87–5.11)
RDW: 14.1 % (ref 11.5–15.5)
WBC: 4.9 10*3/uL (ref 4.0–10.5)
nRBC: 0 % (ref 0.0–0.2)

## 2022-12-23 LAB — TROPONIN I (HIGH SENSITIVITY)
Troponin I (High Sensitivity): 20 ng/L — ABNORMAL HIGH
Troponin I (High Sensitivity): 20 ng/L — ABNORMAL HIGH

## 2022-12-23 MED ORDER — ONDANSETRON HCL 4 MG/2ML IJ SOLN
4.0000 mg | Freq: Once | INTRAMUSCULAR | Status: AC
  Administered 2022-12-23: 4 mg via INTRAVENOUS
  Filled 2022-12-23: qty 2

## 2022-12-23 MED ORDER — TRAMADOL HCL 50 MG PO TABS: 50.0000 mg | ORAL_TABLET | Freq: Four times a day (QID) | ORAL | 0 refills | Status: AC | PRN

## 2022-12-23 MED ORDER — FENTANYL CITRATE PF 50 MCG/ML IJ SOSY
50.0000 ug | PREFILLED_SYRINGE | Freq: Once | INTRAMUSCULAR | Status: AC
  Administered 2022-12-23: 50 ug via INTRAVENOUS
  Filled 2022-12-23: qty 1

## 2022-12-23 NOTE — ED Provider Notes (Signed)
[facility] Provider Note   CSN: [id number] Arrival date & time: 12/23/22  [id number]     History {Add pertinent medical, surgical, social history, OB history to HPI:1} Chief Complaint  Patient presents with   Chest Pain   Cough    Deborah Elliott is a 77 y.o. female who presents emerged the department with a chief complaint of chest pain.  She has a past medical history of diabetes, hypertension, hyperlipidemia, CAD status post DES x 3, previous reverse total left shoulder arthroplasty.  Patient states that she was diagnosed with COVID through home COVID test 3 weeks ago.  Symptoms at that time were fatigue.  She has had a cough which has been persistent for some time.  She does not feel that her cough has been worse.  She states that Saturday she started having what she describes as gripping left-sided chest pain which is worse with position change and left arm movement.  It is better with rest.  She has associated shortness of breath which is exertional but has been ongoing for "many months.  "She takes Lasix occasionally but does not have any worsening in peripheral edema or weight gain.  She denies nausea or vomiting.  She took aspirin prior to arrival after she was instructed to do so by EMS and has significant improvement in her left-sided chest pain.   Chest Pain Associated symptoms: cough   Cough Associated symptoms: chest pain        Home Medications Prior to Admission medications   Medication Sig Start Date End Date Taking? Authorizing Provider  acetaminophen (TYLENOL) 500 MG tablet Take 500 mg by mouth daily as needed (pain).     [provider]  Apoaequorin (PREVAGEN PO) Take 1 tablet by mouth daily.    [provider]  clopidogrel (PLAVIX) 75 MG tablet TAKE 1 TABLET DAILY 12/09/22   Lorretta Harp, MD  cyclobenzaprine (FLEXERIL) 10 MG tablet Take 1 tablet (10 mg total) by mouth 3 (three) times daily as needed for  muscle spasms. Patient not taking: Reported on 08/13/2022 03/16/20   Shuford, Olivia Mackie, PA-C  esomeprazole (NEXIUM) 20 MG capsule Take 20 mg by mouth in the morning. Patient not taking: Reported on 08/13/2022    [provider]  ezetimibe (ZETIA) 10 MG tablet TAKE ONE TABLET BY MOUTH EVERY DAY 04/07/22   Lorretta Harp, MD  fosinopril (MONOPRIL) 20 MG tablet Take 20 mg by mouth in the morning.     [provider]  furosemide (LASIX) 20 MG tablet Take 1 tablet (20 mg total) by mouth as needed for edema (leg swelling). 08/13/22   Warren Lacy, PA-C  GNP CO Q10 200 MG capsule TAKE (1) CAPSULE DAILY 07/08/21   Lorretta Harp, MD  Lancets [facility] DELICA PLUS [id number]) MISC Apply topically 2 (two) times daily. 06/02/22   [provider]  meclizine (ANTIVERT) 25 MG tablet Take 25 mg by mouth 2 (two) times daily as needed for dizziness.  Patient not taking: Reported on 08/13/2022 07/31/14   [provider]  metFORMIN (GLUCOPHAGE) 500 MG tablet Take 500 mg by mouth 2 (two) times daily.    [provider]  methocarbamol (ROBAXIN) 500 MG tablet Take 500 mg by mouth every 6 (six) hours as needed for muscle spasms (6 to 8 hours). Patient not taking: Reported on 08/13/2022    [provider]  metoprolol succinate (TOPROL-XL) 100 MG 24 hr tablet TAKE ONE TABLET  EVERY MORNING WITH MEAL(S) 12/08/22   Warren Lacy, PA-C  metoprolol tartrate (LOPRESSOR) 50 MG tablet Take 50 mg tablet by mouth as needed every 6 hours up to 3 times daily for palpitations 12/10/22   Caron Presume K, PA-C  ondansetron (ZOFRAN) 4 MG tablet Take 1 tablet (4 mg total) by mouth every 8 (eight) hours as needed for nausea or vomiting. Patient not taking: Reported on 08/13/2022 12/14/19   Lennice Sites, DO  [facility] VERIO test strip 2 (two) times daily. 06/02/22   [provider]  potassium chloride SA (K-DUR,KLOR-CON) 20 MEQ tablet Take 20 mEq by mouth as needed.  Take 1 tablet as needed when Lasix is taken     Caron Presume K, PA-C  rosuvastatin (CRESTOR) 40 MG tablet Take 1 tablet (40 mg total) by mouth daily. Please keep scheduled appointment 10/15/22   Lorretta Harp, MD      Allergies    Morphine and related, Elemental sulfur, Latex, and Tape    Review of Systems   Review of Systems  Respiratory:  Positive for cough.   Cardiovascular:  Positive for chest pain.    Physical Exam Updated Vital Signs BP 137/83   Pulse 60   Temp 98.3 F (36.8 C) (Oral)   Resp 17   Ht '5\' 5"'$  (1.651 m)   Wt 102.5 kg   SpO2 98%   BMI 37.61 kg/m  Physical Exam Vitals and nursing note reviewed.  Constitutional:      General: She is not in acute distress.    Appearance: She is well-developed. She is not diaphoretic.  HENT:     Head: Normocephalic and atraumatic.     Right Ear: External ear normal.     Left Ear: External ear normal.     Nose: Nose normal.     Mouth/Throat:     Mouth: Mucous membranes are moist.  Eyes:     General: No scleral icterus.    Conjunctiva/sclera: Conjunctivae normal.  Cardiovascular:     Rate and Rhythm: Normal rate and regular rhythm.     Heart sounds: Normal heart sounds. No murmur heard.    No friction rub. No gallop.  Pulmonary:     Effort: Pulmonary effort is normal. No respiratory distress.     Breath sounds: Normal breath sounds.  Chest:    Abdominal:     General: Bowel sounds are normal. There is no distension.     Palpations: Abdomen is soft. There is no mass.     Tenderness: There is no abdominal tenderness. There is no guarding.  Musculoskeletal:     Cervical back: Normal range of motion.     Right lower leg: 1+ Pitting Edema present.     Left lower leg: 1+ Pitting Edema present.  Skin:    General: Skin is warm and dry.  Neurological:     Mental Status: She is alert and oriented to person, place, and time.  Psychiatric:        Behavior: Behavior normal.     ED Results / Procedures /  Treatments   Labs (all labs ordered are listed, but only abnormal results are displayed) Labs Reviewed - No data to display  EKG EKG Interpretation  Date/Time:  Tuesday December 23 2022 10:06:13 EST Ventricular Rate:  65 PR Interval:  233 QRS Duration: 164 QT Interval:  473 QTC Calculation: 492 R Axis:   -50 Text Interpretation: Sinus rhythm Prolonged PR interval Left bundle branch block No significant change since  last tracing Confirmed by Leanord Asal ([id number]) on 12/23/2022 10:10:10 AM  Radiology No results found.  Procedures Procedures  {Document cardiac monitor, telemetry assessment procedure when appropriate:1}  Medications Ordered in ED Medications - No data to display  ED Course/ Medical Decision Making/ A&P   {   Click here for ABCD2, HEART and other calculatorsREFRESH Note before signing :1}                          Medical Decision Making  ***  {Document critical care time when appropriate:1} {Document review of labs and clinical decision tools ie heart score, Chads2Vasc2 etc:1}  {Document your independent review of radiology images, and any outside records:1} {Document your discussion with family members, caretakers, and with consultants:1} {Document social determinants of health affecting pt's care:1} {Document your decision making why or why not admission, treatments were needed:1} Final Clinical Impression(s) / ED Diagnoses Final diagnoses:  None    Rx / DC Orders ED Discharge Orders     None

## 2022-12-23 NOTE — ED Triage Notes (Signed)
Pt from home for eval of chest pain and cough x 2 months. Became anxious this morning with these symptoms so called EMS. SpO2 95% room air. Pain worse with movement, cough, and palpation. Pain free at rest. 324 ASA given PTA. Had covid three weeks ago.

## 2022-12-23 NOTE — Discharge Instructions (Addendum)
Use a lidoderm 4%patch on your chest available OTC.   You have been diagnosed by your caregiver as having chest wall pain. SEEK IMMEDIATE MEDICAL ATTENTION IF: You develop a fever.  Your chest pains become severe or intolerable.  You develop new, unexplained symptoms (problems).  You develop shortness of breath, nausea, vomiting, sweating or feel light headed.  You develop a new cough or you cough up blood.

## 2022-12-29 NOTE — Progress Notes (Signed)
Cardiology Clinic Note   Patient Name: DAMARI MANNOR Date of Encounter: 12/31/2022  Primary Care Provider:  Burnard Bunting, MD Primary Cardiologist:  Quay Burow, MD  Patient Profile    Corinna Capra 77 year old female presents the clinic today for follow-up evaluation of her chest discomfort.  Past Medical History    Past Medical History:  Diagnosis Date   Achilles rupture, left 04/06/2012   Arthritis of knee    Cancer ([place])    skin cancer   Coronary artery disease    X3 STENTS   Diabetes mellitus without complication ([place])    type 2    Dysrhythmia    HX of tachycardia - controlled with med   GERD (gastroesophageal reflux disease)    Hx of skin cancer, basal cell    Hyperlipidemia    Hypertension    Leg cramps    Obesity    Sleep apnea    uses c-pap    Vertigo    Past Surgical History:  Procedure Laterality Date   APPENDECTOMY  35 YRS AGO   COLONOSCOPY     NM MYOCAR PERF WALL MOTION  05/27/2010   normal   OPEN REDUCTION INTERNAL FIXATION (ORIF) DISTAL RADIAL FRACTURE Left 01/06/2020   Procedure: OPEN REDUCTION INTERNAL FIXATION (ORIF) DISTAL RADIAL FRACTURE AND REPAIR AS NECESSARY;  Surgeon: Roseanne Kaufman, MD;  Location: [facility];  Service: Orthopedics;  Laterality: Left;  Block with IV sed 60 mins   REVERSE SHOULDER ARTHROPLASTY Left 03/15/2020   Procedure: REVERSE SHOULDER ARTHROPLASTY;  Surgeon: Justice Britain, MD;  Location: [facility];  Service: Orthopedics;  Laterality: Left;  162mn   SKIN CANCER EXCISION     STENTED CORONARY ARTERY  03/14/2010   X3 STENTS; tandem proximal and mid LAD   TOTAL KNEE ARTHROPLASTY Right 08/29/2013   Procedure: RIGHT TOTAL KNEE ARTHROPLASTY;  Surgeon: FGearlean Alf MD;  Location: [facility];  Service: Orthopedics;  Laterality: Right;   TOTAL KNEE ARTHROPLASTY Left 08/16/2018   Procedure: LEFT TOTAL KNEE ARTHROPLASTY;  Surgeon: AGaynelle Arabian MD;  Location: [facility];  Service: Orthopedics;  Laterality: Left;  576m     Allergies  Allergies  Allergen Reactions   Morphine And Related Other (See Comments)    Reaction unknown per patient   Elemental Sulfur Itching and Rash   Latex Rash    Blood blisters   Tape Rash    History of Present Illness    Rozlynn R CaZarras a PMH of coronary artery disease, HTN, HLD, obesity, OA of knee, closed fracture of distal left radius, and is status post left reverse total shoulder arthroplasty.  She presented to the emergency department on 12/23/2022.  Her EKG showed sinus rhythm with prolonged PR interval left bundle branch block and no significant changes from her previous tracing.  She complained of chest discomfort.  She reported that she had been diagnosed with COVID via home test 3 weeks prior.  She reported symptoms of fatigue during her COVID infection.  She noted a cough which had been present for some time.  She did not feel that her cough had gotten worse.  She reported gripping left-sided chest pain that was worse with position changes and left arm movement.  Her discomfort was better with rest.  She did note shortness of breath with exertion which was ongoing.  She reported as needed dosing of furosemide but had not had any worsening lower extremity edema or weight increase.  She denied nausea and vomiting.  She did take aspirin upon instruction from EMS.  She was noted to have reproducible chest wall pain which was worse with arm movement.  Her CBC, CMP showed no significant abnormalities.  Her BNP was slightly elevated.  Her troponin was 20 and flat.  Her chest x-ray showed no acute findings.  Her symptoms were felt not to represent ACS.  She was instructed to follow-up with cardiology as an outpatient.  She presents to the clinic today for follow-up evaluation and states she has had no further episodes of chest discomfort.  She does note that she had an episode where she passed out in the bathroom.  She has had this happen in the past and each time appears to be  around having bowel movements.  We reviewed vasovagal syncope she her husband and her daughter expressed understanding.  She also questions whether she is having trouble with her gallbladder.  This is unclear.  She does have a follow-up appointment with her PCP later today.  She reports only intermittent episodes of loose stools with fatty meals.  She has been fairly inactive since she has been to the hospital.  I reviewed the importance of increased physical activity.  Her blood pressure today is 128/72.  I will give her chair exercises to start and we will plan follow-up in 6 months.  Today she denies chest pain, shortness of breath, lower extremity edema, fatigue, palpitations, melena, hematuria, hemoptysis, diaphoresis, weakness, presyncope, syncope, orthopnea, and PND.     Home Medications    Prior to Admission medications   Medication Sig Start Date End Date Taking? Authorizing Provider  acetaminophen (TYLENOL) 500 MG tablet Take 500 mg by mouth daily as needed (pain).     [provider]  Apoaequorin (PREVAGEN PO) Take 1 tablet by mouth daily.    [provider]  clopidogrel (PLAVIX) 75 MG tablet TAKE 1 TABLET DAILY 12/09/22   Lorretta Harp, MD  cyclobenzaprine (FLEXERIL) 10 MG tablet Take 1 tablet (10 mg total) by mouth 3 (three) times daily as needed for muscle spasms. Patient not taking: Reported on 08/13/2022 03/16/20   Shuford, Olivia Mackie, PA-C  esomeprazole (NEXIUM) 20 MG capsule Take 20 mg by mouth in the morning. Patient not taking: Reported on 08/13/2022    [provider]  ezetimibe (ZETIA) 10 MG tablet TAKE ONE TABLET BY MOUTH EVERY DAY 04/07/22   Lorretta Harp, MD  fosinopril (MONOPRIL) 20 MG tablet Take 20 mg by mouth in the morning.     [provider]  furosemide (LASIX) 20 MG tablet Take 1 tablet (20 mg total) by mouth as needed for edema (leg swelling). 08/13/22   Warren Lacy, PA-C  GNP CO Q10 200 MG capsule TAKE (1) CAPSULE  DAILY 07/08/21   Lorretta Harp, MD  Lancets [facility] DELICA PLUS [id number]) MISC Apply topically 2 (two) times daily. 06/02/22   [provider]  meclizine (ANTIVERT) 25 MG tablet Take 25 mg by mouth 2 (two) times daily as needed for dizziness.  Patient not taking: Reported on 08/13/2022 07/31/14   [provider]  metFORMIN (GLUCOPHAGE) 500 MG tablet Take 500 mg by mouth 2 (two) times daily.    [provider]  methocarbamol (ROBAXIN) 500 MG tablet Take 500 mg by mouth every 6 (six) hours as needed for muscle spasms (6 to 8 hours). Patient not taking: Reported on 08/13/2022    [provider]  metoprolol succinate (TOPROL-XL) 100 MG 24 hr tablet TAKE ONE TABLET  EVERY MORNING WITH MEAL(S) 12/08/22   Warren Lacy, PA-C  metoprolol tartrate (LOPRESSOR) 50 MG tablet Take 50 mg tablet by mouth as needed every 6 hours up to 3 times daily for palpitations 12/10/22   Caron Presume K, PA-C  ondansetron (ZOFRAN) 4 MG tablet Take 1 tablet (4 mg total) by mouth every 8 (eight) hours as needed for nausea or vomiting. Patient not taking: Reported on 08/13/2022 12/14/19   Lennice Sites, DO  [facility] VERIO test strip 2 (two) times daily. 06/02/22   [provider]  potassium chloride SA (K-DUR,KLOR-CON) 20 MEQ tablet Take 20 mEq by mouth as needed. Take 1 tablet as needed when Lasix is taken     Caron Presume K, PA-C  rosuvastatin (CRESTOR) 40 MG tablet Take 1 tablet (40 mg total) by mouth daily. Please keep scheduled appointment 10/15/22   Lorretta Harp, MD  traMADol (ULTRAM) 50 MG tablet Take 1 tablet (50 mg total) by mouth every 6 (six) hours as needed. 12/23/22   Margarita Mail, PA-C    Family History    Family History  Problem Relation Age of Onset   Hypertension Mother    Diabetes Mother    Stroke Mother    Aneurysm Father    Cancer Maternal Grandmother    She indicated that her mother is deceased. She indicated that her father is  deceased. She indicated that her brother is deceased. She indicated that her maternal grandmother is deceased. She indicated that her maternal grandfather is deceased. She indicated that her paternal grandmother is deceased. She indicated that her paternal grandfather is deceased.  Social History    Social History   Socioeconomic History   Marital status: Married    Spouse name: Not on file   Number of children: Not on file   Years of education: Not on file   Highest education level: Not on file  Occupational History   Not on file  Tobacco Use   Smoking status: Former    Types: Cigarettes    Quit date: 08/23/1970    Years since quitting: 52.3   Smokeless tobacco: Never  Vaping Use   Vaping Use: Never used  Substance and Sexual Activity   Alcohol use: No   Drug use: No   Sexual activity: Not Currently    Birth control/protection: Post-menopausal  Other Topics Concern   Not on file  Social History Narrative   Not on file   Social Determinants of Health   Financial Resource Strain: Not on file  Food Insecurity: Not on file  Transportation Needs: Not on file  Physical Activity: Not on file  Stress: Not on file  Social Connections: Not on file  Intimate Partner Violence: Not on file     Review of Systems    General:  No chills, fever, night sweats or weight changes.  Cardiovascular:  No chest pain, dyspnea on exertion, edema, orthopnea, palpitations, paroxysmal nocturnal dyspnea. Dermatological: No rash, lesions/masses Respiratory: No cough, dyspnea Urologic: No hematuria, dysuria Abdominal:   No nausea, vomiting, diarrhea, bright red blood per rectum, melena, or hematemesis Neurologic:  No visual changes, wkns, changes in mental status. All other systems reviewed and are otherwise negative except as noted above.  Physical Exam    VS:  BP 128/74   Pulse 72   Ht 5' 4.5" (1.638 m)   Wt 220 lb (99.8 kg)   SpO2 97%   BMI 37.18 kg/m  , BMI Body mass index is 37.18  kg/m. GEN:  Well nourished, well developed, in no acute distress. HEENT: normal. Neck: Supple, no JVD, carotid bruits, or masses. Cardiac: RRR, no murmurs, rubs, or gallops. No clubbing, cyanosis, edema.  Radials/DP/PT 2+ and equal bilaterally.  Respiratory:  Respirations regular and unlabored, clear to auscultation bilaterally. GI: Soft, nontender, nondistended, BS + x 4. MS: no deformity or atrophy. Skin: warm and dry, no rash. Neuro:  Strength and sensation are intact. Psych: Normal affect.  Accessory Clinical Findings    Recent Labs: 12/23/2022: ALT 14; B Natriuretic Peptide 319.2; BUN 9; Creatinine, Ser 0.63; Hemoglobin 12.6; Platelets 117; Potassium 3.9; Sodium 140   Recent Lipid Panel    Component Value Date/Time   CHOL 152 03/21/2021 0900   TRIG 136 03/21/2021 0900   HDL 42 03/21/2021 0900   CHOLHDL 3.6 03/21/2021 0900   CHOLHDL 5.4 03/14/2010 0655   VLDL 43 (H) 03/14/2010 0655   LDLCALC 86 03/21/2021 0900         ECG personally reviewed by me today-none today.  Nuclear stress test 05/16/2022   Findings are consistent with no prior ischemia. The study is intermediate risk.   No ST deviation was noted.   LV perfusion is abnormal. There is no evidence of ischemia. There is no evidence of infarction. Defect 1: There is a medium defect with moderate reduction in uptake present in the mid anteroseptal location(s) that is fixed. There is abnormal wall motion in the defect area. Consistent with artifact caused by left bundle branch block.   Left ventricular function is abnormal. Global function is moderately reduced. End diastolic cavity size is moderately enlarged. End systolic cavity size is mildly enlarged.   Prior study available for comparison from 05/27/2010. There are changes compared to prior study. LVEF 62%, fixed anteroseptal perfusion defect suggestive of breast attenuation artifact, no ischemia   Medium size, moderate intensity fixed anteroseptal perfusion defect,  probably LBBB related artifact or less likely scar. LVEF 35% with global hypokinesis and incoordinate septal motion. This is an intermediate risk study. Compared to a prior study in 2011, the defect appears similar, however, the LVEF is significantly lower. Recommend echo correlation.   Cardiac event monitor 09/10/2022 Patch Wear Time:  13 days and 21 hours (2023-10-30T20:32:51-0400 to 2023-11-13T17:00:19-0500)   Patient had a min HR of 47 bpm, max HR of 133 bpm, and avg HR of 63 bpm. Predominant underlying rhythm was Sinus Rhythm. First Degree AV Block was present. Bundle Branch Block/IVCD was present. 1 run of Ventricular Tachycardia occurred lasting 4 beats  with a max rate of 133 bpm (avg 104 bpm). 19 Supraventricular Tachycardia runs occurred, the run with the fastest interval lasting 4 beats with a max rate of 129 bpm, the longest lasting 16 beats with an avg rate of 97 bpm. Isolated SVEs were rare  (<1.0%), SVE Couplets were rare (<1.0%), and SVE Triplets were rare (<1.0%). Isolated VEs were rare (<1.0%), VE Couplets were rare (<1.0%), and no VE Triplets were present. Ventricular Bigeminy and Trigeminy were present.   SR/SB/ST Occasional PVCs Short runs of SVT Short runs of NSVT  Assessment & Plan   1.  Chest pain-denies further episodes of chest discomfort.  Reviewed recent emergency department visit.  She expressed understanding. Slowly increase physical activity-chair exercises given No plans for ischemic evaluation  Coronary artery disease-denies further episodes of arm neck back or chest discomfort.  Underwent cardiac catheterization with PCI to her LAD and 2011.  With stress testing 7/23 which showed no ischemia and perfusion defect which was  felt to be related to LBBB. Continue current medical therapy Heart healthy low-sodium diet Increase physical activity as tolerated  Hyperlipidemia-LDL 68 on 06/11/22. Heart healthy low-sodium high-fiber diet Continue rosuvastatin,  ezetimibe  Essential hypertension-BP [id number]. Continue current medical therapy Maintain blood pressure log  Palpitations-heart rate today 72bpm.  Continues to notice occasional brief episodes of palpitations happening every few months.  Palpitations better since compliance with CPAP.  Cardiac event monitor 08/13/2022 reassuring. Continue metoprolol Avoid triggers caffeine, chocolate, EtOH, dehydration etc.    Disposition: Follow-up with Dr.Berry or me in 6 months.   Jossie Ng. Dupree Givler NP-C     12/31/2022, 12:30 PM [facility] [address] Office [phone] Fax [phone]    I spent 14 minutes examining this patient, reviewing medications, and using patient centered shared decision making involving her cardiac care.  Prior to her visit I spent greater than 20 minutes reviewing her past medical history,  medications, and prior cardiac tests.

## 2022-12-31 ENCOUNTER — Encounter: Payer: Self-pay | Admitting: General Practice

## 2022-12-31 ENCOUNTER — Ambulatory Visit: Payer: Medicare Other | Attending: General Practice | Admitting: General Practice

## 2022-12-31 VITALS — BP 128/74 | HR 72 | Ht 64.5 in | Wt 220.0 lb

## 2022-12-31 DIAGNOSIS — I2510 Atherosclerotic heart disease of native coronary artery without angina pectoris: Secondary | ICD-10-CM | POA: Diagnosis not present

## 2022-12-31 DIAGNOSIS — R002 Palpitations: Secondary | ICD-10-CM

## 2022-12-31 DIAGNOSIS — I10 Essential (primary) hypertension: Secondary | ICD-10-CM

## 2022-12-31 DIAGNOSIS — R079 Chest pain, unspecified: Secondary | ICD-10-CM | POA: Diagnosis not present

## 2022-12-31 DIAGNOSIS — E785 Hyperlipidemia, unspecified: Secondary | ICD-10-CM | POA: Diagnosis not present

## 2022-12-31 NOTE — Patient Instructions (Addendum)
Medication Instructions:  The current medical regimen is effective;  continue present plan and medications as directed. Please refer to the Current Medication list given to you today.  *If you need a refill on your cardiac medications before your next appointment, please call your pharmacy*  Lab Work: NONE If you have labs (blood work) drawn today and your tests are completely normal, you will receive your results only by: [place] (if you have MyChart) OR A paper copy in the mail If you have any lab test that is abnormal or we need to change your treatment, we will call you to review the results.  Testing/Procedures: NONE  Follow-Up: At [facility], you and your health needs are our priority.  As part of our continuing mission to provide you with exceptional heart care, we have created designated Provider Care Teams.  These Care Teams include your primary Cardiologist (physician) and Advanced Practice Providers (APPs -  Physician Assistants and Nurse Practitioners) who all work together to provide you with the care you need, when you need it.  We recommend signing up for the patient portal called "MyChart".  Sign up information is provided on this After Visit Summary.  MyChart is used to connect with patients for Virtual Visits (Telemedicine).  Patients are able to view lab/test results, encounter notes, upcoming appointments, etc.  Non-urgent messages can be sent to your provider as well.   To learn more about what you can do with MyChart, go to [email or web address].    Your next appointment:   6 month(s)  Provider:   Quay Burow, MD  or Coletta Memos, FNP        Other Instructions MAKE SURE TO TALK TO [place] PRIMARY MD ABOUT YOUR GALLBLADDER   MAINTAIN HYDRATION  Exercises to do While Sitting  Exercises that you do while sitting (chair exercises) can give you many of the same benefits as full exercise. Benefits include strengthening your heart, burning  calories, and keeping muscles and joints healthy. Exercise can also improve your mood and help with depression and anxiety. You may benefit from chair exercises if you are unable to do standing exercises due to: Diabetic foot pain. Obesity. Illness. Arthritis. Recovery from surgery or injury. Breathing problems. Balance problems. Another type of disability. Before starting chair exercises, check with your health care provider or a physical therapist to find out how much exercise you can tolerate and which exercises are safe for you. If your health care provider approves: Start out slowly and build up over time. Aim to work up to about 10-20 minutes for each exercise session. Make exercise part of your daily routine. Drink water when you exercise. Do not wait until you are thirsty. Drink every 10-15 minutes. Stop exercising right away if you have pain, nausea, shortness of breath, or dizziness. If you are exercising in a wheelchair, make sure to lock the wheels. Ask your health care provider whether you can do tai chi or yoga. Many positions in these mind-body exercises can be modified to do while seated. Warm-up Before starting other exercises: Sit up as straight as you can. Have your knees bent at 90 degrees, which is the shape of the capital letter "L." Keep your feet flat on the floor. Sit at the front edge of your chair, if you can. Pull in (tighten) the muscles in your abdomen and stretch your spine and neck as straight as you can. Hold this position for a few minutes. Breathe in and out evenly. Try to  concentrate on your breathing, and relax your mind. Stretching Exercise A: Arm stretch Hold your arms out straight in front of your body. Bend your hands at the wrist with your fingers pointing up, as if signaling someone to stop. Notice the slight tension in your forearms as you hold the position. Keeping your arms out and your hands bent, rotate your hands outward as far as you can  and hold this stretch. Aim to have your thumbs pointing up and your pinkie fingers pointing down. Slowly repeat arm stretches for one minute as tolerated. Exercise B: Leg stretch If you can move your legs, try to "draw" letters on the floor with the toes of your foot. Write your name with one foot. Write your name with the toes of your other foot. Slowly repeat the movements for one minute as tolerated. Exercise C: Reach for the sky Reach your hands as far over your head as you can to stretch your spine. Move your hands and arms as if you are climbing a rope. Slowly repeat the movements for one minute as tolerated. Range of motion exercises Exercise A: Shoulder roll Let your arms hang loosely at your sides. Lift just your shoulders up toward your ears, then let them relax back down. When your shoulders feel loose, rotate your shoulders in backward and forward circles. Do shoulder rolls slowly for one minute as tolerated. Exercise B: March in place As if you are marching, pump your arms and lift your legs up and down. Lift your knees as high as you can. If you are unable to lift your knees, just pump your arms and move your ankles and feet up and down. March in place for one minute as tolerated. Exercise C: Seated jumping jacks Let your arms hang down straight. Keeping your arms straight, lift them up over your head. Aim to point your fingers to the ceiling. While you lift your arms, straighten your legs and slide your heels along the floor to your sides, as wide as you can. As you bring your arms back down to your sides, slide your legs back together. If you are unable to use your legs, just move your arms. Slowly repeat seated jumping jacks for one minute as tolerated. Strengthening exercises Exercise A: Shoulder squeeze Hold your arms straight out from your body to your sides, with your elbows bent and your fists pointed at the ceiling. Keeping your arms in the bent position, move  them forward so your elbows and forearms meet in front of your face. Open your arms back out as wide as you can with your elbows still bent, until you feel your shoulder blades squeezing together. Hold for 5 seconds. Slowly repeat the movements forward and backward for one minute as tolerated. Contact a health care provider if: You have to stop exercising due to any of the following: Pain. Nausea. Shortness of breath. Dizziness. Fatigue. You have significant pain or soreness after exercising. Get help right away if: You have chest pain. You have difficulty breathing. These symptoms may represent a serious problem that is an emergency. Do not wait to see if the symptoms will go away. Get medical help right away. Call your local emergency services (911 in the [place] Do not drive yourself to the hospital. Summary Exercises that you do while sitting (chair exercises) can strengthen your heart, burn calories, and keep muscles and joints healthy. You may benefit from chair exercises if you are unable to do standing exercises due to diabetic foot  pain, obesity, recovery from surgery or injury, or other conditions. Before starting chair exercises, check with your health care provider or a physical therapist to find out how much exercise you can tolerate and which exercises are safe for you. This information is not intended to replace advice given to you by your health care provider. Make sure you discuss any questions you have with your health care provider. Document Revised: 12/02/2020 Document Reviewed: 12/02/2020 Elsevier Patient Education  [place].

## 2023-01-01 ENCOUNTER — Other Ambulatory Visit: Payer: Self-pay | Admitting: Internal Medicine

## 2023-01-01 ENCOUNTER — Encounter (HOSPITAL_COMMUNITY): Payer: Self-pay

## 2023-01-01 ENCOUNTER — Emergency Department (HOSPITAL_COMMUNITY): Payer: Medicare Other

## 2023-01-01 ENCOUNTER — Emergency Department (HOSPITAL_COMMUNITY)
Admission: EM | Admit: 2023-01-01 | Discharge: 2023-01-01 | Disposition: A | Discharge status: Home / Self Care | Payer: Medicare Other | Attending: Emergency Medicine | Admitting: Emergency Medicine

## 2023-01-01 DIAGNOSIS — I10 Essential (primary) hypertension: Secondary | ICD-10-CM | POA: Diagnosis not present

## 2023-01-01 DIAGNOSIS — Z7901 Long term (current) use of anticoagulants: Secondary | ICD-10-CM | POA: Diagnosis not present

## 2023-01-01 DIAGNOSIS — R55 Syncope and collapse: Secondary | ICD-10-CM | POA: Insufficient documentation

## 2023-01-01 DIAGNOSIS — Z7984 Long term (current) use of oral hypoglycemic drugs: Secondary | ICD-10-CM | POA: Diagnosis not present

## 2023-01-01 DIAGNOSIS — R112 Nausea with vomiting, unspecified: Secondary | ICD-10-CM

## 2023-01-01 DIAGNOSIS — E119 Type 2 diabetes mellitus without complications: Secondary | ICD-10-CM | POA: Diagnosis not present

## 2023-01-01 DIAGNOSIS — Z79899 Other long term (current) drug therapy: Secondary | ICD-10-CM | POA: Insufficient documentation

## 2023-01-01 DIAGNOSIS — Z91040 Latex allergy status: Secondary | ICD-10-CM | POA: Insufficient documentation

## 2023-01-01 DIAGNOSIS — Z8616 Personal history of COVID-19: Secondary | ICD-10-CM | POA: Diagnosis not present

## 2023-01-01 LAB — CBG MONITORING, ED: Glucose-Capillary: 97 mg/dL (ref 70–99)

## 2023-01-01 LAB — CBC
HCT: 39.5 % (ref 36.0–46.0)
Hemoglobin: 12.7 g/dL (ref 12.0–15.0)
MCH: 29.7 pg (ref 26.0–34.0)
MCHC: 32.2 g/dL (ref 30.0–36.0)
MCV: 92.5 fL (ref 80.0–100.0)
Platelets: 125 10*3/uL — ABNORMAL LOW (ref 150–400)
RBC: 4.27 MIL/uL (ref 3.87–5.11)
RDW: 14.2 % (ref 11.5–15.5)
WBC: 6.7 10*3/uL (ref 4.0–10.5)
nRBC: 0 % (ref 0.0–0.2)

## 2023-01-01 LAB — BASIC METABOLIC PANEL
Anion gap: 9 (ref 5–15)
BUN: 16 mg/dL (ref 8–23)
CO2: 21 mmol/L — ABNORMAL LOW (ref 22–32)
Calcium: 8.3 mg/dL — ABNORMAL LOW (ref 8.9–10.3)
Chloride: 109 mmol/L (ref 98–111)
Creatinine, Ser: 0.65 mg/dL (ref 0.44–1.00)
GFR, Estimated: >60 mL/min (ref >60)
Glucose, Bld: 123 mg/dL — ABNORMAL HIGH (ref 70–99)
Potassium: 3.5 mmol/L (ref 3.5–5.1)
Sodium: 139 mmol/L (ref 135–145)

## 2023-01-01 NOTE — Discharge Instructions (Addendum)
Unfortunately the passing out spell that she had tonight is something that may occur over and over if you are bearing down, please make sure that you are not straining to have bowel movements  You can follow-up with your family doctor in the outpatient setting.  All of your testing has been normal otherwise  Please make sure that you are not straining, lifting heavy things or doing anything that might cause you to have to bear down

## 2023-01-01 NOTE — ED Triage Notes (Addendum)
Pt BIBA from home. Pt c/o syncopal event today while on the toilet. Pt states she was straining. Pt also had a fall on Monday, resulting in right foot injury. Pt was seen at PCP yesterday for r/o gallstones.   With EMS: 129 CBG 157/60 60 HR 94% RA 1L NS Zofran 20 g R hand

## 2023-01-01 NOTE — ED Provider Notes (Signed)
[facility] Provider Note   CSN: [id number] Arrival date & time: 01/01/23  1500     History  Chief Complaint  Patient presents with   Loss of Consciousness    Deborah Elliott is a 77 y.o. female.   Loss of Consciousness  Very pleasant 77 year old female on Plavix, treated for diabetes, hypertension, high cholesterol.  Compliant with her medications.  Reports that today while she was sitting on the toilet having a bowel movement and straining she became lightheaded and dizzy, called her husband and and became acutely diaphoretic pale and then passed out with a short amount of seizure activity.  Shortly thereafter she was back to her normal self and feels back to her normal self now.  She had a similar episode earlier this week, of note the patient did have recent COVID approximately a month ago, she has a history of 3 drug-eluting stents, she had 2 troponins which were flat and saw her cardiologist the next day.  She reports to me that she had a similar episode with a near syncopal episode while on the toilet straining at that time.  Ultimately at this time she feels back to her normal self with no chest pain shortness of breath headache and does not feel nauseated, no rashes, no other complaints.    Home Medications Prior to Admission medications   Medication Sig Start Date End Date Taking? Authorizing Provider  acetaminophen (TYLENOL) 500 MG tablet Take 500 mg by mouth daily as needed (pain).     [provider]  Apoaequorin (PREVAGEN PO) Take 1 tablet by mouth daily.    [provider]  clopidogrel (PLAVIX) 75 MG tablet TAKE 1 TABLET DAILY 12/09/22   Lorretta Harp, MD  cyclobenzaprine (FLEXERIL) 10 MG tablet Take 1 tablet (10 mg total) by mouth 3 (three) times daily as needed for muscle spasms. 03/16/20   Shuford, Olivia Mackie, PA-C  esomeprazole (NEXIUM) 20 MG capsule Take 20 mg by mouth in the morning.    [provider]  ezetimibe (ZETIA) 10 MG tablet TAKE ONE TABLET BY MOUTH EVERY DAY 04/07/22   Lorretta Harp, MD  fosinopril (MONOPRIL) 20 MG tablet Take 20 mg by mouth in the morning.     [provider]  furosemide (LASIX) 20 MG tablet Take 1 tablet (20 mg total) by mouth as needed for edema (leg swelling). 08/13/22   Warren Lacy, PA-C  GNP CO Q10 200 MG capsule TAKE (1) CAPSULE DAILY 07/08/21   Lorretta Harp, MD  Lancets [facility] DELICA PLUS [id number]) MISC Apply topically 2 (two) times daily. 06/02/22   [provider]  meclizine (ANTIVERT) 25 MG tablet Take 25 mg by mouth 2 (two) times daily as needed for dizziness. 07/31/14   [provider]  metFORMIN (GLUCOPHAGE) 500 MG tablet Take 500 mg by mouth 2 (two) times daily.    [provider]  methocarbamol (ROBAXIN) 500 MG tablet Take 500 mg by mouth every 6 (six) hours as needed for muscle spasms (6 to 8 hours).    [provider]  metoprolol succinate (TOPROL-XL) 100 MG 24 hr tablet TAKE ONE TABLET EVERY MORNING WITH MEAL(S) 12/08/22   Warren Lacy, PA-C  metoprolol tartrate (LOPRESSOR) 50 MG tablet Take 50 mg tablet by mouth as needed every 6 hours up to 3 times daily for palpitations 12/10/22   Caron Presume K, PA-C  ondansetron (ZOFRAN) 4 MG tablet Take 1 tablet (4  mg total) by mouth every 8 (eight) hours as needed for nausea or vomiting. 12/14/19   Curatolo, Adam, DO  ONETOUCH VERIO test strip 2 (two) times daily. 06/02/22   [provider]  potassium chloride SA (K-DUR,KLOR-CON) 20 MEQ tablet Take 20 mEq by mouth as needed. Take 1 tablet as needed when Lasix is taken     Caron Presume K, PA-C  rosuvastatin (CRESTOR) 40 MG tablet Take 1 tablet (40 mg total) by mouth daily. Please keep scheduled appointment 10/15/22   Lorretta Harp, MD  traMADol (ULTRAM) 50 MG tablet Take 1 tablet (50 mg total) by mouth every 6 (six) hours as needed. 12/23/22   Margarita Mail, PA-C       Allergies    Morphine and related, Elemental sulfur, Latex, and Tape    Review of Systems   Review of Systems  Cardiovascular:  Positive for syncope.  All other systems reviewed and are negative.   Physical Exam Updated Vital Signs BP 129/79 (BP Location: Left Arm)   Pulse 64   Temp (!) 97.5 F (36.4 C)   Resp 18   Ht 1.638 m (5' 4.5")   Wt 99.8 kg   SpO2 95%   BMI 37.18 kg/m  Physical Exam Vitals and nursing note reviewed.  Constitutional:      General: She is not in acute distress.    Appearance: She is well-developed.  HENT:     Head: Normocephalic and atraumatic.     Mouth/Throat:     Pharynx: No oropharyngeal exudate.  Eyes:     General: No scleral icterus.       Right eye: No discharge.        Left eye: No discharge.     Conjunctiva/sclera: Conjunctivae normal.     Pupils: Pupils are equal, round, and reactive to light.  Neck:     Thyroid: No thyromegaly.     Vascular: No JVD.  Cardiovascular:     Rate and Rhythm: Normal rate and regular rhythm.     Heart sounds: Normal heart sounds. No murmur heard.    No friction rub. No gallop.  Pulmonary:     Effort: Pulmonary effort is normal. No respiratory distress.     Breath sounds: Normal breath sounds. No wheezing or rales.  Abdominal:     General: Bowel sounds are normal. There is no distension.     Palpations: Abdomen is soft. There is no mass.     Tenderness: There is no abdominal tenderness.  Musculoskeletal:        General: Tenderness present. Normal range of motion.     Cervical back: Normal range of motion and neck supple.     Right lower leg: No edema.     Left lower leg: No edema.     Comments: Mild tenderness over the dorsum of the right foot with range of motion  Lymphadenopathy:     Cervical: No cervical adenopathy.  Skin:    General: Skin is warm and dry.     Findings: No erythema or rash.  Neurological:     General: No focal deficit present.     Mental Status: She is alert.      Coordination: Coordination normal.  Psychiatric:        Behavior: Behavior normal.     ED Results / Procedures / Treatments   Labs (all labs ordered are listed, but only abnormal results are displayed) Labs Reviewed  BASIC METABOLIC PANEL - Abnormal; Notable for the following  components:      Result Value   CO2 21 (*)    Glucose, Bld 123 (*)    Calcium 8.3 (*)    All other components within normal limits  CBC - Abnormal; Notable for the following components:   Platelets 125 (*)    All other components within normal limits  URINALYSIS, ROUTINE W REFLEX MICROSCOPIC  CBG MONITORING, ED    EKG EKG Interpretation  Date/Time:  Thursday January 01 2023 20:43:00 EDT Ventricular Rate:  59 PR Interval:  218 QRS Duration: 168 QT Interval:  492 QTC Calculation: 487 R Axis:   -53 Text Interpretation: Sinus bradycardia with 1st degree A-V block Left axis deviation Left bundle branch block Abnormal ECG When compared with ECG of 23-Dec-2022 10:06, PREVIOUS ECG IS PRESENT since last tracing no significant change Confirmed by Noemi Chapel [phone]) on 01/01/2023 9:06:28 PM  Radiology DG Foot Complete Right  Result Date: 01/01/2023 CLINICAL DATA:  Fall, bruising EXAM: RIGHT FOOT COMPLETE - 3+ VIEW COMPARISON:  None Available. FINDINGS: There is a small bony fragment along the dorsal aspect of the talar head. There is mild adjacent dorsal soft tissue swelling. There is a chronic posttraumatic deformity of the second metatarsal shaft. Moderate phalangeal joint osteoarthritis. No bone erosion or periostitis. Tiny plantar calcaneal spur. IMPRESSION: Small bony fragment along the dorsal aspect of the talar head, could reflect a small avulsion fracture, correlate with point tenderness. Mild adjacent dorsal soft tissue swelling. Electronically Signed   By: Maurine Simmering M.D.   On: 01/01/2023 16:08    Procedures Procedures    Medications Ordered in ED Medications - No data to display  ED Course/  Medical Decision Making/ A&P                             Medical Decision Making Amount and/or Complexity of Data Reviewed Labs: ordered. Radiology: ordered. ECG/medicine tests: ordered.   This patient is well-appearing, she has no complaints, she had an x-ray of the right foot which was done that shows a tiny avulsion fracture that correlates with the area where she is hurting.  No need for immobilization or aggressive measures.  Will obtain EKG to make sure she has not had an arrhythmia, her lab work done earlier this week was unremarkable,  Metabolic panel tonight unremarkable, CBC unremarkable, chronic mild thrombocytopenia  EKG without changes No arrythmia in the ED Suspect vasovagal event Stable for d/c Counseleled patient on bearing down / BM        Final Clinical Impression(s) / ED Diagnoses Final diagnoses:  Vasovagal syncope    Rx / DC Orders ED Discharge Orders     None         Noemi Chapel, MD 01/01/23 2112

## 2023-01-01 NOTE — ED Provider Triage Note (Signed)
Emergency Medicine Provider Triage Evaluation Note  Deborah Elliott , a 77 y.o. female  was evaluated in triage.  Pt complains of syncope.  Review of Systems  Positive: syncope Negative: Cp, sob, headache, neck pain  Physical Exam  BP (!) 143/60 (BP Location: Left Arm)   Pulse 64   Temp 98.2 F (36.8 C)   Resp 18   Ht 5' 4.5" (1.638 m)   Wt 99.8 kg   SpO2 97%   BMI 37.18 kg/m  Gen:   Awake, no distress   Resp:  Normal effort  MSK:   Moves extremities without difficulty  Other:  No murmur, normal rate   Medical Decision Making  Medically screening exam initiated at 3:12 PM.  Appropriate orders placed.  Deborah Elliott was informed that the remainder of the evaluation will be completed by another provider, this initial triage assessment does not replace that evaluation, and the importance of remaining in the ED until their evaluation is complete.  77 yo F with a chief complaint of a syncopal event.  This occurred while she was bearing down to move her bowels.  This has been a recurrent issue for her.  Sounds vasovagal by history.  Will plan to do blood work EKG.  Likely DC home with PCP follow-up.  I did discuss with her about starting perhaps a stool softener or laxative.   Deno Etienne, DO 01/01/23 1513

## 2023-01-06 ENCOUNTER — Ambulatory Visit (HOSPITAL_COMMUNITY)
Admission: RE | Admit: 2023-01-06 | Discharge: 2023-01-06 | Disposition: A | Discharge status: Home / Self Care | Payer: Medicare Other | Source: Ambulatory Visit | Attending: Internal Medicine | Admitting: Internal Medicine

## 2023-01-06 DIAGNOSIS — R112 Nausea with vomiting, unspecified: Secondary | ICD-10-CM | POA: Diagnosis not present

## 2023-02-02 ENCOUNTER — Other Ambulatory Visit: Payer: Medicare Other

## 2023-02-05 ENCOUNTER — Other Ambulatory Visit: Payer: Self-pay | Admitting: Cardiovascular Disease

## 2023-03-18 ENCOUNTER — Ambulatory Visit: Payer: Medicare Other | Admitting: Cardiovascular Disease

## 2023-03-18 ENCOUNTER — Other Ambulatory Visit: Payer: Self-pay | Admitting: Physician Assistant

## 2023-05-01 ENCOUNTER — Other Ambulatory Visit: Payer: Self-pay | Admitting: Cardiovascular Disease

## 2023-05-20 ENCOUNTER — Emergency Department (HOSPITAL_COMMUNITY)
Admission: EM | Admit: 2023-05-20 | Discharge: 2023-05-21 | Disposition: A | Discharge status: Home / Self Care | Payer: Medicare Other | Attending: Emergency Medicine | Admitting: Emergency Medicine

## 2023-05-20 ENCOUNTER — Emergency Department (HOSPITAL_COMMUNITY): Payer: Medicare Other

## 2023-05-20 DIAGNOSIS — M25571 Pain in right ankle and joints of right foot: Secondary | ICD-10-CM | POA: Diagnosis not present

## 2023-05-20 DIAGNOSIS — W01198A Fall on same level from slipping, tripping and stumbling with subsequent striking against other object, initial encounter: Secondary | ICD-10-CM | POA: Diagnosis not present

## 2023-05-20 DIAGNOSIS — S0001XA Abrasion of scalp, initial encounter: Secondary | ICD-10-CM | POA: Diagnosis not present

## 2023-05-20 DIAGNOSIS — S0990XA Unspecified injury of head, initial encounter: Secondary | ICD-10-CM | POA: Diagnosis present

## 2023-05-20 DIAGNOSIS — S0003XA Contusion of scalp, initial encounter: Secondary | ICD-10-CM | POA: Insufficient documentation

## 2023-05-20 DIAGNOSIS — W19XXXA Unspecified fall, initial encounter: Secondary | ICD-10-CM

## 2023-05-20 LAB — COMPREHENSIVE METABOLIC PANEL
ALT: 13 U/L (ref 0–44)
AST: 22 U/L (ref 15–41)
Albumin: 3.5 g/dL (ref 3.5–5.0)
Alkaline Phosphatase: 47 U/L (ref 38–126)
Anion gap: 9 (ref 5–15)
BUN: 10 mg/dL (ref 8–23)
CO2: 22 mmol/L (ref 22–32)
Calcium: 8.9 mg/dL (ref 8.9–10.3)
Chloride: 109 mmol/L (ref 98–111)
Creatinine, Ser: 0.68 mg/dL (ref 0.44–1.00)
GFR, Estimated: >60 mL/min (ref >60)
Glucose, Bld: 160 mg/dL — ABNORMAL HIGH (ref 70–99)
Potassium: 3.6 mmol/L (ref 3.5–5.1)
Sodium: 140 mmol/L (ref 135–145)
Total Bilirubin: 0.8 mg/dL (ref 0.3–1.2)
Total Protein: 6 g/dL — ABNORMAL LOW (ref 6.5–8.1)

## 2023-05-20 LAB — I-STAT CHEM 8, ED
BUN: 11 mg/dL (ref 8–23)
Calcium, Ion: 1.13 mmol/L — ABNORMAL LOW (ref 1.15–1.40)
Chloride: 108 mmol/L (ref 98–111)
Creatinine, Ser: 0.6 mg/dL (ref 0.44–1.00)
Glucose, Bld: 163 mg/dL — ABNORMAL HIGH (ref 70–99)
HCT: 39 % (ref 36.0–46.0)
Hemoglobin: 13.3 g/dL (ref 12.0–15.0)
Potassium: 4 mmol/L (ref 3.5–5.1)
Sodium: 140 mmol/L (ref 135–145)
TCO2: 23 mmol/L (ref 22–32)

## 2023-05-20 LAB — URINALYSIS, ROUTINE W REFLEX MICROSCOPIC
Bilirubin Urine: NEGATIVE
Glucose, UA: NEGATIVE mg/dL
Hgb urine dipstick: NEGATIVE
Ketones, ur: NEGATIVE mg/dL
Leukocytes,Ua: NEGATIVE
Nitrite: NEGATIVE
Protein, ur: NEGATIVE mg/dL
Specific Gravity, Urine: 1.002 — ABNORMAL LOW (ref 1.005–1.030)
pH: 7 (ref 5.0–8.0)

## 2023-05-20 LAB — CBC
HCT: 40.5 % (ref 36.0–46.0)
Hemoglobin: 13.4 g/dL (ref 12.0–15.0)
MCH: 29.4 pg (ref 26.0–34.0)
MCHC: 33.1 g/dL (ref 30.0–36.0)
MCV: 88.8 fL (ref 80.0–100.0)
Platelets: 128 10*3/uL — ABNORMAL LOW (ref 150–400)
RBC: 4.56 MIL/uL (ref 3.87–5.11)
RDW: 13.8 % (ref 11.5–15.5)
WBC: 6.5 10*3/uL (ref 4.0–10.5)
nRBC: 0 % (ref 0.0–0.2)

## 2023-05-20 LAB — ETHANOL: Alcohol, Ethyl (B): <10 mg/dL (ref <10)

## 2023-05-20 LAB — I-STAT CG4 LACTIC ACID, ED: Lactic Acid, Venous: 1.4 mmol/L (ref 0.5–1.9)

## 2023-05-20 LAB — PROTIME-INR
INR: 1.1 (ref 0.8–1.2)
Prothrombin Time: 14 seconds (ref 11.4–15.2)

## 2023-05-20 LAB — SAMPLE TO BLOOD BANK

## 2023-05-20 NOTE — ED Provider Notes (Signed)
[place] EMERGENCY DEPARTMENT AT [facility] Provider Note   CSN: [id number] Arrival date & time: 05/20/23  2133     History Chief Complaint  Patient presents with   Fall    HPI Yarelie R Golias is a 77 y.o. female presenting for chief complaint ground-level fall.  Brought in as a level 2 trauma because of a fall on thinners. She states that she was sitting back in a recliner lost her balance and the whole recliner flipped over backwards hitting her head on the wall.  She has a large abrasion to her scalp with a hematoma underneath it. Endorses right ankle pain as part of the fall has been unable to bear weight to the right ankle.  Otherwise she denies fevers chills nausea vomiting syncope shortness of breath..   Patient's recorded medical, surgical, social, medication list and allergies were reviewed in the Snapshot window as part of the initial history.   Review of Systems   Review of Systems  Constitutional:  Negative for chills and fever.  HENT:  Negative for ear pain and sore throat.   Eyes:  Negative for pain and visual disturbance.  Respiratory:  Negative for cough and shortness of breath.   Cardiovascular:  Negative for chest pain and palpitations.  Gastrointestinal:  Negative for abdominal pain and vomiting.  Genitourinary:  Negative for dysuria and hematuria.  Musculoskeletal:  Negative for arthralgias and back pain.  Skin:  Negative for color change and rash.  Neurological:  Negative for seizures and syncope.  All other systems reviewed and are negative.   Physical Exam Updated Vital Signs BP (!) 183/92   Pulse (!) 139   Temp 98.2 F (36.8 C) (Oral)   Resp (!) 21   SpO2 97%  Physical Exam Vitals and nursing note reviewed.  Constitutional:      General: She is not in acute distress.    Appearance: She is well-developed.  HENT:     Head: Normocephalic and atraumatic.  Eyes:     Conjunctiva/sclera: Conjunctivae normal.  Cardiovascular:     Rate  and Rhythm: Normal rate and regular rhythm.     Heart sounds: No murmur heard. Pulmonary:     Effort: Pulmonary effort is normal. No respiratory distress.     Breath sounds: Normal breath sounds.  Abdominal:     General: There is no distension.     Palpations: Abdomen is soft.     Tenderness: There is no abdominal tenderness. There is no right CVA tenderness or left CVA tenderness.  Musculoskeletal:        General: Deformity (Abrasion and swelling to the posterior scalp.  No visible laceration.  Large abrasion/skin tear) present. No swelling or tenderness. Normal range of motion.     Cervical back: Neck supple.  Skin:    General: Skin is warm and dry.  Neurological:     General: No focal deficit present.     Mental Status: She is alert and oriented to person, place, and time. Mental status is at baseline.     Cranial Nerves: No cranial nerve deficit.      ED Course/ Medical Decision Making/ A&P    Procedures .Critical Care  Performed by: Glyn Ade, MD Authorized by: Glyn Ade, MD   Critical care provider statement:    Critical care time (minutes):  30   Critical care was necessary to treat or prevent imminent or life-threatening deterioration of the following conditions:  Trauma   Critical care was time  spent personally by me on the following activities:  Development of treatment plan with patient or surrogate, discussions with consultants, evaluation of patient's response to treatment, examination of patient, ordering and review of laboratory studies, ordering and review of radiographic studies, ordering and performing treatments and interventions, pulse oximetry, re-evaluation of patient's condition and review of old charts .Marland KitchenLaceration Repair  Date/Time: 05/20/2023 11:37 PM  Performed by: Glyn Ade, MD Authorized by: Glyn Ade, MD   Consent:    Consent obtained:  Verbal   Consent given by:  Patient   Risks, benefits, and alternatives were  discussed: yes     Risks discussed:  Pain, poor cosmetic result, nerve damage, need for additional repair and infection Laceration details:    Location:  Finger   Finger location:  R index finger   Length (cm):  3 Skin repair:    Repair method:  Tissue adhesive Approximation:    Approximation:  Close Repair type:    Repair type:  Simple    Medications Ordered in ED Medications - No data to display Medical Decision Making:    LYANA DISTEL is a 77 y.o. female who presented to the ED today with a high mechanisma trauma, detailed above.    By institutional and departmental policy this was activated as a level 2 trauma. Handoff received from EMS.  Patient placed on continuous vitals and telemetry monitoring while in ED which was reviewed periodically.   Given this mechanism of trauma, a full physical exam was performed.  Reviewed and confirmed nursing documentation for past medical history, family history, social history.    Initial Assessment/Plan:   I was called emergently to patient's bedside for a primary survey.  Primary survey: Airway intact.  BL breath sounds present.   Circulation established with WNL BP, 2 large bore IVs, and radial/femoral pulses.   Disability evaluation negative. No obvious disability requiring intervention.   Patient fully exposed and all injuries were noted, any penetrating injuries were labeled with radiopaque markers.  No emergent interventions took place in the primary survey.    Patient stable for CXR that demonstrated no traumatic hemopneumothorax and PXR that demonstrated no unstable pelvic fractures.  EFAST deferred.   Secondary survey: Once patient was stabilized, I personally performed a secondary survey to evaluate for any other injuries.  Results of this evaluation documented in the physical exam section. This is a patient presenting with a high mechanism trauma.  As such, I have considered intracranial injuries including intracranial  hemorrhage, intrathoracic injuries including blunt myocardial or blunt lung injury, blunt abdominal injuries including aortic dissection, bladder injury, spleen injury, liver injury and I have considered orthopedic injuries including extremity or spinal injury.   This was all evaluated by the below imaging as well as concurrently ordered laboratory evaluation which was reviewed.  Radiology: All radiology results were reviewed independently and agree with reads per radiology provider. CT HEAD WO CONTRAST  Result Date: 05/20/2023 CLINICAL DATA:  Fall EXAM: CT HEAD WITHOUT CONTRAST CT CERVICAL SPINE WITHOUT CONTRAST TECHNIQUE: Multidetector CT imaging of the head and cervical spine was performed following the standard protocol without intravenous contrast. Multiplanar CT image reconstructions of the cervical spine were also generated. RADIATION DOSE REDUCTION: This exam was performed according to the departmental dose-optimization program which includes automated exposure control, adjustment of the mA and/or kV according to patient size and/or use of iterative reconstruction technique. COMPARISON:  CT brain 02/23/2020, 12/14/2019 FINDINGS: CT HEAD FINDINGS Brain: No acute territorial infarction, hemorrhage, or intracranial  mass. Moderate advanced white matter hypodensity. Mild atrophy. Moderate ventricular enlargement appears similar compared to prior. Vascular: No hyperdense vessels.  Carotid vascular calcification Skull: Normal. Negative for fracture or focal lesion. Sinuses/Orbits: No acute finding. Other: None CT CERVICAL SPINE FINDINGS Alignment: Mild reversal of cervical lordosis. Trace anterolisthesis C3 on C4, C4 on C5 and C5 on C6. Facet alignment within normal limits. Skull base and vertebrae: No acute fracture. No primary bone lesion or focal pathologic process. Soft tissues and spinal canal: No prevertebral fluid or swelling. No visible canal hematoma. Disc levels: Mild disc space narrowing C6-C7.  Facet degenerative change at multiple levels. Upper chest: Negative. Other: None IMPRESSION: 1. No CT evidence for acute intracranial abnormality. Atrophy and chronic small vessel ischemic changes of the white matter. 2. Moderate ventricular enlargement grossly stable, somewhat out of proportion to degree of atrophy, correlate for normal pressure hydrocephalus. Suspect that there is a component of transependymal CSF extravasation superimposed on underlying chronic small vessel disease. 3. Reversal of cervical lordosis with degenerative changes. No acute osseous abnormality. Electronically Signed   By: Jasmine Pang M.D.   On: 05/20/2023 22:46   CT CERVICAL SPINE WO CONTRAST  Result Date: 05/20/2023 CLINICAL DATA:  Fall EXAM: CT HEAD WITHOUT CONTRAST CT CERVICAL SPINE WITHOUT CONTRAST TECHNIQUE: Multidetector CT imaging of the head and cervical spine was performed following the standard protocol without intravenous contrast. Multiplanar CT image reconstructions of the cervical spine were also generated. RADIATION DOSE REDUCTION: This exam was performed according to the departmental dose-optimization program which includes automated exposure control, adjustment of the mA and/or kV according to patient size and/or use of iterative reconstruction technique. COMPARISON:  CT brain 02/23/2020, 12/14/2019 FINDINGS: CT HEAD FINDINGS Brain: No acute territorial infarction, hemorrhage, or intracranial mass. Moderate advanced white matter hypodensity. Mild atrophy. Moderate ventricular enlargement appears similar compared to prior. Vascular: No hyperdense vessels.  Carotid vascular calcification Skull: Normal. Negative for fracture or focal lesion. Sinuses/Orbits: No acute finding. Other: None CT CERVICAL SPINE FINDINGS Alignment: Mild reversal of cervical lordosis. Trace anterolisthesis C3 on C4, C4 on C5 and C5 on C6. Facet alignment within normal limits. Skull base and vertebrae: No acute fracture. No primary bone lesion  or focal pathologic process. Soft tissues and spinal canal: No prevertebral fluid or swelling. No visible canal hematoma. Disc levels: Mild disc space narrowing C6-C7. Facet degenerative change at multiple levels. Upper chest: Negative. Other: None IMPRESSION: 1. No CT evidence for acute intracranial abnormality. Atrophy and chronic small vessel ischemic changes of the white matter. 2. Moderate ventricular enlargement grossly stable, somewhat out of proportion to degree of atrophy, correlate for normal pressure hydrocephalus. Suspect that there is a component of transependymal CSF extravasation superimposed on underlying chronic small vessel disease. 3. Reversal of cervical lordosis with degenerative changes. No acute osseous abnormality. Electronically Signed   By: Jasmine Pang M.D.   On: 05/20/2023 22:46   DG Ankle Right Port  Result Date: 05/20/2023 CLINICAL DATA:  Fall with ankle pain EXAM: PORTABLE RIGHT ANKLE - 2 VIEW COMPARISON:  None Available. FINDINGS: Soft tissue swelling.  No fracture or malalignment. IMPRESSION: Soft tissue swelling without acute osseous abnormality. Electronically Signed   By: Jasmine Pang M.D.   On: 05/20/2023 22:37   DG Pelvis Portable  Result Date: 05/20/2023 CLINICAL DATA:  Fall EXAM: PORTABLE PELVIS 1-2 VIEWS COMPARISON:  None Available. FINDINGS: SI joints are non widened. Pubic symphysis and rami appear intact. No convincing fracture or malalignment. Mild bilateral hip degenerative change.  IMPRESSION: No definite acute osseous abnormality. Cross-sectional imaging follow-up if continued suspicion for pelvic fracture Electronically Signed   By: Jasmine Pang M.D.   On: 05/20/2023 22:36   DG Chest Port 1 View  Result Date: 05/20/2023 CLINICAL DATA:  Fall on blood thinners EXAM: PORTABLE CHEST 1 VIEW COMPARISON:  12/23/2022 FINDINGS: Partially visualized left shoulder replacement. No acute airspace disease or effusion. Stable cardiomediastinal silhouette with aortic  atherosclerosis. No pneumothorax. IMPRESSION: No active disease. Electronically Signed   By: Jasmine Pang M.D.   On: 05/20/2023 22:35    Final Reassessment and Plan:   Trauma x-rays grossly nondiagnostic.  Patient stated her tetanus was up-to-date. Laceration was repaired with Steri-Strips.  Is going to need significant wound care in the outpatient setting.  Under too much tension for stitches.  Informed the family of this they are in agreement with medical/wound care.   Disposition:   Based on the above findings, I believe this patient is stable for admission.    Patient/family educated about specific findings on our evaluation and explained exact reasons for admission.  Patient/family educated about clinical situation and time was allowed to answer questions.   Admission team communicated with and agreed with need for admission. Patient admitted. Patient ready to move at this time.     Emergency Department Medication Summary:   Medications - No data to display    Clinical Impression:  1. Fall, initial encounter      Discharge   Final Clinical Impression(s) / ED Diagnoses Final diagnoses:  Fall, initial encounter    Rx / DC Orders ED Discharge Orders     None         Glyn Ade, MD 05/20/23 2340

## 2023-05-20 NOTE — ED Notes (Signed)
Countryman MD at bedside to update pt and family.

## 2023-05-21 NOTE — ED Triage Notes (Signed)
Pt presents to ED 32 via EMS from home as Level 2 Trauma.  Pt fell while getting into pool-side chair and hit head on cement wall.  Denies LOC and remembers events of fall.  Pt takes blood thinner.  Lac to back of head; bleeding controlled.  Scattered abrasions; swelling to right leg with difficulty bearing weight.  Pt also tore pad of right ring finger, which is currently wrapped.

## 2023-05-21 NOTE — ED Notes (Signed)
Pt provided with AVS.  Education complete; all questions answered.  Pt assisted with changing into blue paper scrubs for discharge.  Pt leaving ED in stable condition at this time via wheelchair with all belongings.  Pt taken to ED entrance by this RN and assisted into family's vehicle.

## 2023-06-12 ENCOUNTER — Other Ambulatory Visit: Payer: Self-pay | Admitting: Physician Assistant

## 2023-06-12 ENCOUNTER — Other Ambulatory Visit: Payer: Self-pay | Admitting: Cardiovascular Disease

## 2023-06-12 DIAGNOSIS — E785 Hyperlipidemia, unspecified: Secondary | ICD-10-CM

## 2023-06-30 ENCOUNTER — Ambulatory Visit: Payer: Medicare Other | Attending: Cardiovascular Disease | Admitting: Cardiovascular Disease

## 2023-06-30 ENCOUNTER — Encounter: Payer: Self-pay | Admitting: Cardiovascular Disease

## 2023-06-30 VITALS — BP 138/84 | HR 60 | Ht 65.0 in | Wt 224.0 lb

## 2023-06-30 DIAGNOSIS — I10 Essential (primary) hypertension: Secondary | ICD-10-CM | POA: Diagnosis not present

## 2023-06-30 DIAGNOSIS — I2510 Atherosclerotic heart disease of native coronary artery without angina pectoris: Secondary | ICD-10-CM

## 2023-06-30 DIAGNOSIS — E785 Hyperlipidemia, unspecified: Secondary | ICD-10-CM

## 2023-06-30 NOTE — Assessment & Plan Note (Signed)
History of dyslipidemia on rosuvastatin and Zetia with lipid profile performed 06/11/2022 revealing a total cholesterol of 137, LDL 68 and HDL 42.

## 2023-06-30 NOTE — Assessment & Plan Note (Signed)
History of essential hypertension blood pressure measured today at 138/84.  She is on Monopril and metoprolol.

## 2023-06-30 NOTE — Assessment & Plan Note (Signed)
History of CAD status post PCI and stenting of her LAD in 3 places by myself with Taxus drug-eluting stents back 03/14/2010 after being admitted with unstable angina.  She did have a 60% mid RCA lesion which was treated medically.  She had a Myoview stress test performed 05/20/2022 because of dyspnea that showed no ischemia but did have left bundle branch artifact.  She is otherwise asymptomatic.

## 2023-06-30 NOTE — Patient Instructions (Signed)
Medication Instructions:  Your physician recommends that you continue on your current medications as directed. Please refer to the Current Medication list given to you today.  *If you need a refill on your cardiac medications before your next appointment, please call your pharmacy*   Follow-Up: At [facility], you and your health needs are our priority.  As part of our continuing mission to provide you with exceptional heart care, we have created designated Provider Care Teams.  These Care Teams include your primary Cardiologist (physician) and Advanced Practice Providers (APPs -  Physician Assistants and Nurse Practitioners) who all work together to provide you with the care you need, when you need it.  We recommend signing up for the patient portal called "MyChart".  Sign up information is provided on this After Visit Summary.  MyChart is used to connect with patients for Virtual Visits (Telemedicine).  Patients are able to view lab/test results, encounter notes, upcoming appointments, etc.  Non-urgent messages can be sent to your provider as well.   To learn more about what you can do with MyChart, go to [email or web address].    Your next appointment:   6 month(s)  Provider:   Coletta Memos, FNP      Then, Quay Burow, MD will plan to see you again in 12 month(s).

## 2023-06-30 NOTE — Progress Notes (Signed)
06/30/2023 Deborah Elliott   1946/10/14  [id number]  Primary Physician Geoffry Paradise, MD Primary Cardiologist: Runell Gess MD Nicholes Calam[place], [place]  HPI:  Deborah Elliott is a 77 y.o.   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 I saw her in the office 06/02/2022. She is accompanied by her husband [name] today.  She has obstructive sleep apnea on CPAP which she benefits from for which she sees Dr. Daphene Jaeger.  Unfortunately, her CPAP machine has not worked in a while.  She has a history of CAD status post PCI and stenting of her LAD in 3 places by myself with Taxus drug-eluting stents back on Mar 14, 2010, after being admitted with unstable angina. She had a normal EF. She did have a 50% to 60% mid RCA lesion that was treated medically. Her other problems include hypertension and hyperlipidemia. Dr. Jacky Kindle follows her lipid profile closely.   She has had bilateral total knee replacements and left wrist surgery by Dr. Amanda Pea.  She also underwent left shoulder surgery by Dr. Rennis Chris.  She currently walks with a cane because of instability.   Since I saw her in the office a year ago she has remained stable.  She denies chest pain or shortness of breath.  She did have a Myoview stress test performed 05/20/2022 that showed no ischemia but did show a left bundle branch block artifact.  2D echo performed 04/30/2022 revealed EF of 40 to 45% without valvular abnormalities.   Current Meds  Medication Sig   acetaminophen (TYLENOL) 500 MG tablet Take 500 mg by mouth daily as needed (pain).    Apoaequorin (PREVAGEN PO) Take 1 tablet by mouth daily.   clopidogrel (PLAVIX) 75 MG tablet TAKE ONE TABLET DAILY   cyclobenzaprine (FLEXERIL) 10 MG tablet Take 1 tablet (10 mg total) by mouth 3 (three) times daily as needed for muscle spasms.   esomeprazole (NEXIUM) 20 MG capsule Take 20  mg by mouth in the morning.   ezetimibe (ZETIA) 10 MG tablet Take 1 tablet (10 mg total) by mouth daily.   fosinopril (MONOPRIL) 20 MG tablet Take 20 mg by mouth in the morning.    furosemide (LASIX) 20 MG tablet Take 1 tablet (20 mg total) by mouth as needed.   GNP CO Q10 200 MG capsule TAKE (1) CAPSULE DAILY   Lancets (ONETOUCH DELICA PLUS LANCET33G) MISC Apply topically 2 (two) times daily.   meclizine (ANTIVERT) 25 MG tablet Take 25 mg by mouth 2 (two) times daily as needed for dizziness.   metFORMIN (GLUCOPHAGE) 500 MG tablet Take 500 mg by mouth 2 (two) times daily.   methocarbamol (ROBAXIN) 500 MG tablet Take 500 mg by mouth every 6 (six) hours as needed for muscle spasms (6 to 8 hours).   metoprolol succinate (TOPROL-XL) 100 MG 24 hr tablet TAKE ONE TABLET EVERY MORNING WITH MEAL(S)   metoprolol tartrate (LOPRESSOR) 50 MG tablet Take 50 mg tablet by mouth as needed every 6 hours up to 3 times daily for palpitations   ondansetron (ZOFRAN) 4 MG tablet Take 1 tablet (4 mg total) by mouth every 8 (eight) hours as needed for nausea or vomiting.   ONETOUCH VERIO test strip 2 (two) times daily.   potassium chloride SA (K-DUR,KLOR-CON) 20 MEQ tablet Take 20 mEq by mouth as needed. Take 1 tablet as needed when Lasix is  taken    rosuvastatin (CRESTOR) 40 MG tablet TAKE 1 TABLET ONCE DAILY IN THE EVENING   traMADol (ULTRAM) 50 MG tablet Take 1 tablet (50 mg total) by mouth every 6 (six) hours as needed.     Allergies  Allergen Reactions   Morphine And Codeine Other (See Comments)    Reaction unknown per patient   Elemental Sulfur Itching and Rash   Latex Rash    Blood blisters   Tape Rash    Social History   Socioeconomic History   Marital status: Married    Spouse name: Not on file   Number of children: Not on file   Years of education: Not on file   Highest education level: Not on file  Occupational History   Not on file  Tobacco Use   Smoking status: Former    Current  packs/day: 0.00    Types: Cigarettes    Quit date: 08/23/1970    Years since quitting: 52.8   Smokeless tobacco: Never  Vaping Use   Vaping status: Never Used  Substance and Sexual Activity   Alcohol use: No   Drug use: No   Sexual activity: Not Currently    Birth control/protection: Post-menopausal  Other Topics Concern   Not on file  Social History Narrative   Not on file   Social Determinants of Health   Financial Resource Strain: Not on file  Food Insecurity: Not on file  Transportation Needs: Not on file  Physical Activity: Not on file  Stress: Not on file  Social Connections: Not on file  Intimate Partner Violence: Not on file     Review of Systems: General: negative for chills, fever, night sweats or weight changes.  Cardiovascular: negative for chest pain, dyspnea on exertion, edema, orthopnea, palpitations, paroxysmal nocturnal dyspnea or shortness of breath Dermatological: negative for rash Respiratory: negative for cough or wheezing Urologic: negative for hematuria Abdominal: negative for nausea, vomiting, diarrhea, bright red blood per rectum, melena, or hematemesis Neurologic: negative for visual changes, syncope, or dizziness All other systems reviewed and are otherwise negative except as noted above.    Blood pressure 138/84, pulse 60, height 5\' 5"  (1.651 m), weight 224 lb (101.6 kg).  General appearance: alert and no distress Neck: no adenopathy, no carotid bruit, no JVD, supple, symmetrical, trachea midline, and thyroid not enlarged, symmetric, no tenderness/mass/nodules Lungs: clear to auscultation bilaterally Heart: Regular rate and rhythm without murmurs gallops rubs or clicks Extremities: extremities normal, atraumatic, no cyanosis or edema Pulses: 2+ and symmetric Skin: Skin color, texture, turgor normal. No rashes or lesions Neurologic: Grossly normal  EKG not performed today      ASSESSMENT AND PLAN:   Coronary artery disease History of  CAD status post PCI and stenting of her LAD in 3 places by myself with Taxus drug-eluting stents back 03/14/2010 after being admitted with unstable angina.  She did have a 60% mid RCA lesion which was treated medically.  She had a Myoview stress test performed 05/20/2022 because of dyspnea that showed no ischemia but did have left bundle branch artifact.  She is otherwise asymptomatic.  Essential hypertension History of essential hypertension blood pressure measured today at 138/84.  She is on Monopril and metoprolol.  Dyslipidemia History of dyslipidemia on rosuvastatin and Zetia with lipid profile performed 06/11/2022 revealing a total cholesterol of 137, LDL 68 and HDL 42.     Runell Gess MD FACP,FACC,FAHA, [facility] 06/30/2023 1:09 PM

## 2023-09-11 ENCOUNTER — Telehealth: Payer: Self-pay | Admitting: Cardiovascular Disease

## 2023-09-11 NOTE — Telephone Encounter (Signed)
[facility] calling to state they sent over a request via fax for all of the CPAP supplies the pt needs.States it is a cover sheet. Please advise

## 2023-09-24 ENCOUNTER — Other Ambulatory Visit: Payer: Self-pay | Admitting: Physician Assistant

## 2023-10-05 NOTE — Telephone Encounter (Signed)
thx

## 2023-10-06 ENCOUNTER — Other Ambulatory Visit: Payer: Self-pay | Admitting: Internal Medicine

## 2023-10-06 DIAGNOSIS — Z1231 Encounter for screening mammogram for malignant neoplasm of breast: Secondary | ICD-10-CM

## 2023-10-29 ENCOUNTER — Ambulatory Visit
Admission: RE | Admit: 2023-10-29 | Discharge: 2023-10-29 | Disposition: A | Discharge status: Home / Self Care | Payer: Medicare Other | Source: Ambulatory Visit | Attending: Internal Medicine | Admitting: Internal Medicine

## 2023-10-29 DIAGNOSIS — Z1231 Encounter for screening mammogram for malignant neoplasm of breast: Secondary | ICD-10-CM

## 2024-02-19 ENCOUNTER — Other Ambulatory Visit: Payer: Self-pay | Admitting: Cardiovascular Disease

## 2024-03-29 ENCOUNTER — Other Ambulatory Visit: Payer: Self-pay | Admitting: Cardiovascular Disease

## 2024-06-07 ENCOUNTER — Other Ambulatory Visit: Payer: Self-pay | Admitting: Cardiovascular Disease

## 2024-06-30 ENCOUNTER — Other Ambulatory Visit: Payer: Self-pay | Admitting: Cardiovascular Disease

## 2024-06-30 DIAGNOSIS — E785 Hyperlipidemia, unspecified: Secondary | ICD-10-CM

## 2024-07-06 ENCOUNTER — Encounter: Payer: Self-pay | Admitting: Cardiovascular Disease

## 2024-07-06 ENCOUNTER — Ambulatory Visit: Attending: Cardiovascular Disease | Admitting: Cardiovascular Disease

## 2024-07-06 VITALS — BP 148/70 | HR 59 | Ht 65.0 in | Wt 224.0 lb

## 2024-07-06 DIAGNOSIS — E785 Hyperlipidemia, unspecified: Secondary | ICD-10-CM | POA: Diagnosis not present

## 2024-07-06 DIAGNOSIS — I2510 Atherosclerotic heart disease of native coronary artery without angina pectoris: Secondary | ICD-10-CM | POA: Diagnosis not present

## 2024-07-06 DIAGNOSIS — I10 Essential (primary) hypertension: Secondary | ICD-10-CM

## 2024-07-06 DIAGNOSIS — G4733 Obstructive sleep apnea (adult) (pediatric): Secondary | ICD-10-CM | POA: Diagnosis not present

## 2024-07-06 DIAGNOSIS — I447 Left bundle-branch block, unspecified: Secondary | ICD-10-CM | POA: Insufficient documentation

## 2024-07-06 DIAGNOSIS — I519 Heart disease, unspecified: Secondary | ICD-10-CM | POA: Insufficient documentation

## 2024-07-06 NOTE — Assessment & Plan Note (Signed)
 History of dyslipidemia on high-dose statin therapy with lipid profile performed 06/23/2023 revealing total cholesterol 120, LDL 62 and HDL 39.

## 2024-07-06 NOTE — Assessment & Plan Note (Signed)
 History of essential hypertension blood pressure measured today at 148/70.  Her blood pressure was normal at her PCPs office recently.  She is on metoprolol .

## 2024-07-06 NOTE — Assessment & Plan Note (Signed)
 Chronic

## 2024-07-06 NOTE — Progress Notes (Signed)
 07/06/2024 Deborah Elliott   1946/04/09  [id number]  Primary Physician Shepard Ade, MD Primary Cardiologist: Dorn JINNY Lesches MD [facility] [place], [place]  HPI:  Deborah Elliott is a 78 y.o.   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 I last saw her in the office 06/30/2023.SABRA She is accompanied by her husband Ubaldo today.  She has obstructive sleep apnea on CPAP which she benefits from for which she sees Dr. Charlena Sor.  Unfortunately, her CPAP machine has not worked in a while.  She has a history of CAD status post PCI and stenting of her LAD in 3 places by myself with Taxus drug-eluting stents back on Mar 14, 2010, after being admitted with unstable angina. She had a normal EF. She did have a 50% to 60% mid RCA lesion that was treated medically. Her other problems include hypertension and hyperlipidemia. Dr. Shepard follows her lipid profile closely.   She has had bilateral total knee replacements and left wrist surgery by Dr. Camella.  She also underwent left shoulder surgery by Dr. Melita.  She currently walks with a cane because of instability.   She had a Myoview  stress test performed 05/20/2022 that showed no ischemia but did show a left bundle branch block artifact.  2D echo performed 04/30/2022 revealed EF of 40 to 45% without valvular abnormalities.  Since I saw her in the office a year ago she remained stable.  She has fallen several times for unclear reasons.  She walks with a cane.  She denies chest pain but does get some dyspnea on exertion.   Current Meds  Medication Sig   acetaminophen  (TYLENOL ) 500 MG tablet Take 500 mg by mouth daily as needed (pain).    clopidogrel  (PLAVIX ) 75 MG tablet TAKE ONE TABLET DAILY   cyclobenzaprine  (FLEXERIL ) 10 MG tablet Take 1 tablet (10 mg total) by mouth 3 (three) times daily as needed for muscle spasms.   esomeprazole  (NEXIUM) 20 MG capsule Take 20 mg by mouth in the morning.   ezetimibe  (ZETIA ) 10 MG tablet TAKE ONE TABLET BY MOUTH EVERY DAY   fosinopril  (MONOPRIL ) 20 MG tablet Take 20 mg by mouth in the morning.    GNP CO Q10 200 MG capsule TAKE (1) CAPSULE DAILY   Lancets (ONETOUCH DELICA PLUS LANCET33G) MISC Apply topically 2 (two) times daily.   meclizine  (ANTIVERT ) 25 MG tablet Take 25 mg by mouth 2 (two) times daily as needed for dizziness.   ONETOUCH VERIO test strip 2 (two) times daily.   rosuvastatin  (CRESTOR ) 40 MG tablet TAKE 1 TABLET ONCE DAILY IN THE EVENING   traMADol  (ULTRAM ) 50 MG tablet Take 1 tablet (50 mg total) by mouth every 6 (six) hours as needed.     Allergies  Allergen Reactions   Morphine And Codeine Other (See Comments)    Reaction unknown per patient   Elemental Sulfur Itching and Rash   Latex Rash    Blood blisters   Tape Rash    Social History   Socioeconomic History   Marital status: Married    Spouse name: Not on file   Number of children: Not on file   Years of education: Not on file   Highest education level: Not on file  Occupational History   Not on file  Tobacco Use   Smoking status: Former    Current packs/day:  0.00    Types: Cigarettes    Quit date: 08/23/1970    Years since quitting: 53.9   Smokeless tobacco: Never  Vaping Use   Vaping status: Never Used  Substance and Sexual Activity   Alcohol use: No   Drug use: No   Sexual activity: Not Currently    Birth control/protection: Post-menopausal  Other Topics Concern   Not on file  Social History Narrative   Not on file   Social Drivers of Health   Financial Resource Strain: Not on file  Food Insecurity: Not on file  Transportation Needs: Not on file  Physical Activity: Not on file  Stress: Not on file  Social Connections: Not on file  Intimate Partner Violence: Not on file     Review of Systems: General: negative for chills, fever, night sweats or weight changes.  Cardiovascular:  negative for chest pain, dyspnea on exertion, edema, orthopnea, palpitations, paroxysmal nocturnal dyspnea or shortness of breath Dermatological: negative for rash Respiratory: negative for cough or wheezing Urologic: negative for hematuria Abdominal: negative for nausea, vomiting, diarrhea, bright red blood per rectum, melena, or hematemesis Neurologic: negative for visual changes, syncope, or dizziness All other systems reviewed and are otherwise negative except as noted above.    Blood pressure (!) 148/70, pulse (!) 59, height 5' 5 (1.651 m), weight 224 lb (101.6 kg), SpO2 98%.  General appearance: alert and no distress Neck: no adenopathy, no carotid bruit, no JVD, supple, symmetrical, trachea midline, and thyroid not enlarged, symmetric, no tenderness/mass/nodules Lungs: clear to auscultation bilaterally Heart: regular rate and rhythm, S1, S2 normal, no murmur, click, rub or gallop Extremities: extremities normal, atraumatic, no cyanosis or edema Pulses: 2+ and symmetric Skin: Skin color, texture, turgor normal. No rashes or lesions Neurologic: Grossly normal  EKG EKG Interpretation Date/Time:  Wednesday July 06 2024 11:03:45 EDT Ventricular Rate:  59 PR Interval:  252 QRS Duration:  172 QT Interval:  478 QTC Calculation: 473 R Axis:   -49  Text Interpretation: Sinus bradycardia with 1st degree A-V block with occasional Premature ventricular complexes Left axis deviation Left bundle branch block When compared with ECG of 20-May-2023 22:07, PREVIOUS ECG IS PRESENT Confirmed by Court Carrier [phone]) on 07/06/2024 11:18:37 AM    ASSESSMENT AND PLAN:   Coronary artery disease History of CAD status post PCI and stenting of her LAD in 3 places by myself with Taxus drug-eluting stents 03/14/2010 after being mated with unstable angina.  She did have a 50 to 60% mid RCA lesion which was treated medically.  She denies chest pain or shortness of breath.  Essential  hypertension History of essential hypertension blood pressure measured today at 148/70.  Her blood pressure was normal at her PCPs office recently.  She is on metoprolol .  Dyslipidemia History of dyslipidemia on high-dose statin therapy with lipid profile performed 06/23/2023 revealing total cholesterol 120, LDL 62 and HDL 39.  Obstructive sleep apnea History of obstructive sleep apnea on CPAP.  Left bundle branch block Chronic  LV dysfunction EF by 2D echo t 04/30/2022 was 40 to 45% without valvular abnormalities.  This is thought to be nonischemic probably related to low bundle-branch block.  She is asymptomatic.     Carrier DOROTHA Court MD FACP,FACC,FAHA, [facility] 07/06/2024 11:38 AM

## 2024-07-06 NOTE — Assessment & Plan Note (Signed)
 History of CAD status post PCI and stenting of her LAD in 3 places by myself with Taxus drug-eluting stents 03/14/2010 after being mated with unstable angina.  She did have a 50 to 60% mid RCA lesion which was treated medically.  She denies chest pain or shortness of breath.

## 2024-07-06 NOTE — Patient Instructions (Signed)

## 2024-07-06 NOTE — Assessment & Plan Note (Signed)
 History of obstructive sleep apnea on CPAP.

## 2024-07-06 NOTE — Assessment & Plan Note (Signed)
 EF by 2D echo t 04/30/2022 was 40 to 45% without valvular abnormalities.  This is thought to be nonischemic probably related to low bundle-branch block.  She is asymptomatic.

## 2024-07-18 ENCOUNTER — Other Ambulatory Visit: Payer: Self-pay | Admitting: Cardiovascular Disease

## 2024-09-19 ENCOUNTER — Other Ambulatory Visit: Payer: Self-pay | Admitting: Cardiovascular Disease

## 2024-10-18 ENCOUNTER — Other Ambulatory Visit: Payer: Self-pay | Admitting: Internal Medicine

## 2024-10-18 DIAGNOSIS — Z1231 Encounter for screening mammogram for malignant neoplasm of breast: Secondary | ICD-10-CM

## 2024-11-02 ENCOUNTER — Ambulatory Visit

## 2024-11-03 ENCOUNTER — Ambulatory Visit

## 2024-11-08 ENCOUNTER — Ambulatory Visit
Admission: RE | Admit: 2024-11-08 | Discharge: 2024-11-08 | Disposition: A | Source: Ambulatory Visit | Attending: Internal Medicine | Admitting: Internal Medicine

## 2024-11-08 DIAGNOSIS — Z1231 Encounter for screening mammogram for malignant neoplasm of breast: Secondary | ICD-10-CM
